# Patient Record
Sex: Male | Born: 1960
Health system: Southern US, Community
[De-identification: ages and names within clinical notes are randomized; demographics above are authoritative.]

## PROBLEM LIST (undated history)

## (undated) DIAGNOSIS — C61 Malignant neoplasm of prostate: Secondary | ICD-10-CM

## (undated) DIAGNOSIS — K5792 Diverticulitis of intestine, part unspecified, without perforation or abscess without bleeding: Secondary | ICD-10-CM

## (undated) DIAGNOSIS — F419 Anxiety disorder, unspecified: Secondary | ICD-10-CM

## (undated) DIAGNOSIS — C801 Malignant (primary) neoplasm, unspecified: Secondary | ICD-10-CM

## (undated) DIAGNOSIS — E785 Hyperlipidemia, unspecified: Secondary | ICD-10-CM

## (undated) DIAGNOSIS — T7840XA Allergy, unspecified, initial encounter: Secondary | ICD-10-CM

## (undated) DIAGNOSIS — I10 Essential (primary) hypertension: Secondary | ICD-10-CM

## (undated) HISTORY — DX: Anxiety disorder, unspecified: F41.9

## (undated) HISTORY — PX: LIPOMA EXCISION: SHX5283

## (undated) HISTORY — PX: TONSILLECTOMY: SUR1361

## (undated) HISTORY — DX: Hyperlipidemia, unspecified: E78.5

## (undated) HISTORY — DX: Allergy, unspecified, initial encounter: T78.40XA

---

## 1993-09-06 HISTORY — PX: VASECTOMY: SHX75

## 1998-09-06 DIAGNOSIS — J301 Allergic rhinitis due to pollen: Secondary | ICD-10-CM | POA: Insufficient documentation

## 2001-09-06 DIAGNOSIS — F410 Panic disorder [episodic paroxysmal anxiety] without agoraphobia: Secondary | ICD-10-CM | POA: Insufficient documentation

## 2007-10-08 ENCOUNTER — Emergency Department: Payer: Self-pay | Admitting: Emergency Medicine

## 2011-01-05 HISTORY — PX: PROSTATE BIOPSY: SHX241

## 2012-02-21 DIAGNOSIS — Z86010 Personal history of colonic polyps: Secondary | ICD-10-CM | POA: Insufficient documentation

## 2012-03-02 LAB — HM COLONOSCOPY

## 2013-02-05 ENCOUNTER — Emergency Department: Payer: Self-pay | Admitting: Emergency Medicine

## 2013-03-30 ENCOUNTER — Inpatient Hospital Stay: Payer: Self-pay | Admitting: Surgery

## 2013-03-30 LAB — COMPREHENSIVE METABOLIC PANEL
Alkaline Phosphatase: 77 U/L (ref 50–136)
BUN: 9 mg/dL (ref 7–18)
Calcium, Total: 9 mg/dL (ref 8.5–10.1)
Chloride: 102 mmol/L (ref 98–107)
Co2: 24 mmol/L (ref 21–32)
Creatinine: 1.1 mg/dL (ref 0.60–1.30)
EGFR (African American): >60
EGFR (Non-African Amer.): >60
Glucose: 129 mg/dL — ABNORMAL HIGH (ref 65–99)
Osmolality: 269 (ref 275–301)
SGOT(AST): 26 U/L (ref 15–37)
SGPT (ALT): 36 U/L (ref 12–78)
Sodium: 134 mmol/L — ABNORMAL LOW (ref 136–145)
Total Protein: 7.1 g/dL (ref 6.4–8.2)

## 2013-03-30 LAB — CBC
HCT: 44.1 % (ref 40.0–52.0)
HGB: 15.4 g/dL (ref 13.0–18.0)
MCH: 33.2 pg (ref 26.0–34.0)
RBC: 4.64 10*6/uL (ref 4.40–5.90)
WBC: 11.5 10*3/uL — ABNORMAL HIGH (ref 3.8–10.6)

## 2013-03-30 LAB — URINALYSIS, COMPLETE
Blood: NEGATIVE
Glucose,UR: NEGATIVE mg/dL (ref 0–75)
Ketone: NEGATIVE
Nitrite: NEGATIVE
Protein: NEGATIVE
Squamous Epithelial: <1
WBC UR: 1 /HPF (ref 0–5)

## 2013-03-30 LAB — LIPASE, BLOOD: Lipase: 118 U/L (ref 73–393)

## 2013-03-31 LAB — CBC WITH DIFFERENTIAL/PLATELET
Eosinophil %: 1 %
HCT: 41.5 % (ref 40.0–52.0)
HGB: 14.6 g/dL (ref 13.0–18.0)
Lymphocyte #: 1.3 10*3/uL (ref 1.0–3.6)
MCHC: 35.1 g/dL (ref 32.0–36.0)
MCV: 95 fL (ref 80–100)
Monocyte #: 0.9 x10 3/mm (ref 0.2–1.0)
Monocyte %: 8 %
Neutrophil #: 8.8 10*3/uL — ABNORMAL HIGH (ref 1.4–6.5)
Neutrophil %: 78.6 %
RBC: 4.36 10*6/uL — ABNORMAL LOW (ref 4.40–5.90)
WBC: 11.2 10*3/uL — ABNORMAL HIGH (ref 3.8–10.6)

## 2013-03-31 LAB — BASIC METABOLIC PANEL
BUN: 10 mg/dL (ref 7–18)
Chloride: 103 mmol/L (ref 98–107)
EGFR (Non-African Amer.): >60
Glucose: 120 mg/dL — ABNORMAL HIGH (ref 65–99)
Osmolality: 270 (ref 275–301)
Potassium: 3.6 mmol/L (ref 3.5–5.1)
Sodium: 135 mmol/L — ABNORMAL LOW (ref 136–145)

## 2013-04-01 LAB — CBC WITH DIFFERENTIAL/PLATELET
Basophil #: 0.1 10*3/uL (ref 0.0–0.1)
Basophil %: 1.3 %
Eosinophil %: 4.7 %
HCT: 40.1 % (ref 40.0–52.0)
HGB: 13.9 g/dL (ref 13.0–18.0)
Lymphocyte %: 21.4 %
MCH: 33.4 pg (ref 26.0–34.0)
MCHC: 34.6 g/dL (ref 32.0–36.0)
Monocyte %: 10.3 %
Neutrophil %: 62.3 %
Platelet: 243 10*3/uL (ref 150–440)
RBC: 4.14 10*6/uL — ABNORMAL LOW (ref 4.40–5.90)
RDW: 12.5 % (ref 11.5–14.5)
WBC: 6.8 10*3/uL (ref 3.8–10.6)

## 2014-04-19 ENCOUNTER — Ambulatory Visit: Payer: Self-pay | Admitting: Surgery

## 2014-06-26 LAB — BASIC METABOLIC PANEL
BUN: 15 mg/dL (ref 4–21)
Creatinine: 0.9 mg/dL (ref 0.6–1.3)
Glucose: 108 mg/dL
Potassium: 5.1 mmol/L (ref 3.4–5.3)
Sodium: 137 mmol/L (ref 137–147)

## 2014-06-26 LAB — LIPID PANEL
CHOLESTEROL: 238 mg/dL — AB (ref 0–200)
HDL: 49 mg/dL (ref 35–70)
LDL CALC: 113 mg/dL
TRIGLYCERIDES: 380 mg/dL — AB (ref 40–160)

## 2014-06-27 DIAGNOSIS — E781 Pure hyperglyceridemia: Secondary | ICD-10-CM | POA: Insufficient documentation

## 2014-12-27 NOTE — H&P (Signed)
PATIENT NAME:  Gerald Boyer, Gerald Boyer MR#:  [id number] DATE OF BIRTH:  [birth year]  DATE OF ADMISSION:  03/30/2013  PRIMARY CARE PHYSICIAN: Dr. Caryn Section at [facility].   ADMITTING PHYSICIAN: Rodena Goldmann III, MD  CHIEF COMPLAINT: Abdominal pain.   BRIEF HISTORY: Euan Wandler is a 54 year old gentleman seen in the Emergency Room with a 24-hour history of abdominal pain. He noted some bloating and some mild left lower quadrant pain last evening. He awoke with some fever overnight and pain increased. He presented to his primary care physician for evaluation this morning. He was referred to the Emergency Room where his white count was elevated. Workup in the Emergency Room revealed a white blood cell count of 11,000 with pronounced left lower quadrant pain. CT scan was performed, which revealed probable perforation and descending diverticulitis. The surgical service was consulted.   He denies any previous similar symptoms. He has not had any other significant abdominal history. He has no history of hepatitis, yellow jaundice, pancreatitis, peptic ulcer disease, gallbladder disease or previous diagnosis of diverticulitis. He has had no previous abdominal surgery. His only previous surgeries were vasectomy, lipoma on his shoulder and tonsils and adenoids as a child. He has no major medical problems. He does have a history of depression. He denies a history of hypertension, diabetes or cardiac disease. He does have a history of elevated PSA, currently under work-up. He has been on Avodart in the past and has had negative biopsy on several occasions.   FAMILY HISTORY: Unremarkable, with the exception of a history of atrial fibrillation.   REVIEW OF SYSTEMS:  A 10-point review of systems is unremarkable.   ALLERGIES:  He has  no medical allergies.   CURRENT MEDICATIONS: Include clonazepam 0.5 mg p.o. daily, Effexor XR 150 mg p.o. daily.  SOCIAL HISTORY: He is not a cigarette smoker. 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   PHYSICAL EXAMINATION: GENERAL: He is an alert, pleasant gentleman, obviously in moderate discomfort from his abdominal pain.  VITAL SIGNS: His temperature is 100. Blood pressure is 140/76. Heart rate is 110 and regular.  HEENT: No scleral icterus. No pupillary abnormalities. No facial deformities.  NECK: Supple, nontender, with a midline trachea and no adenopathy.  CHEST: Clear with no adventitious sounds. He has normal pulmonary excursion.  CARDIAC: No murmurs or gallops. Seems to be in normal sinus rhythm.  ABDOMEN: Soft with some moderate left lower quadrant tenderness. There is no rebound or guarding. There is no suprapubic tenderness. He has active bowel sounds.  EXTREMITIES: Lower extremity exam reveals full range of motion, good distal pulses and no deformities.  PSYCHIATRIC: Reveals some depressive affect, but no orientation problems.   ASSESSMENT AND PLAN: I have independently reviewed his CT scan. With his clinical presentation and his CT findings, he does appear to have a perforated descending colon diverticulitis. I have talked with him about the implications of diverticulitis. We have recommended admission to the hospital, aggressive antibiotic therapy and further followup. The possibility of surgery with the risk of a temporary colostomy has been outlined to him in detail. He is in agreement with the current plan.    ____________________________ Micheline Maze, MD rle:jm D: 03/30/2013 20:10:33 ET T: 03/30/2013 21:34:21 ET JOB#: [id number]  cc: Micheline Maze, MD, <Dictator> Kirstie Peri. Caryn Section, MD Rodena Goldmann MD ELECTRONICALLY SIGNED 04/01/2013 2:32

## 2014-12-27 NOTE — Discharge Summary (Signed)
PATIENT NAME:  Gerald Boyer, Gerald Boyer MR#:  [id number] DATE OF BIRTH:  May 27, 1961  DATE OF ADMISSION:  03/30/2013 DATE OF DISCHARGE:    BRIEF HISTORY: Gerald Boyer is a 53 year old gentleman without major medical problems admitted through the Emergency Room with, what appears to be, a perforated descending colitis. The patient had evidence of left lower quadrant pain for several days. Workup suggested microperforation and inflammatory change consistent with diverticulitis. He was admitted to the hospital, placed on IV antibiotics. His symptoms improved very rapidly. His pain is well-controlled. He is discharged home this morning on p.o. antibiotics, pain meds with the plan of following up in the office. We discussed potential for possible elective surgical intervention. He is going to discus problem with his family physician.   DISCHARGE MEDICATIONS: Includes: Claritin p.r.n., ibuprofen 600 mg every 8 hours p.r.n., Percocet 5/325 every 6 hours p.r.n., Effexor 150 mg XR 1 capsule every day and clonazepam 0.5 mg p.o. at bedtime.   FINAL DISCHARGE DIAGNOSIS: Perforated diverticulitis.  ____________________________ Micheline Maze, MD rle:aw D: 04/02/2013 06:26:49 ET T: 04/02/2013 07:51:13 ET JOB#: [id number]  cc: Micheline Maze, MD, <Dictator> Kirstie Peri. Caryn Section, MD Rodena Goldmann MD ELECTRONICALLY SIGNED 04/10/2013 15:14

## 2015-03-15 ENCOUNTER — Emergency Department
Admission: EM | Admit: 2015-03-15 | Discharge: 2015-03-15 | Disposition: A | Discharge status: Home / Self Care | Payer: Commercial Managed Care - HMO | Attending: Emergency Medicine | Admitting: Emergency Medicine

## 2015-03-15 ENCOUNTER — Other Ambulatory Visit: Payer: Self-pay

## 2015-03-15 ENCOUNTER — Emergency Department: Payer: Commercial Managed Care - HMO

## 2015-03-15 ENCOUNTER — Encounter: Payer: Self-pay | Admitting: General Practice

## 2015-03-15 DIAGNOSIS — R0789 Other chest pain: Secondary | ICD-10-CM

## 2015-03-15 DIAGNOSIS — Z79899 Other long term (current) drug therapy: Secondary | ICD-10-CM | POA: Insufficient documentation

## 2015-03-15 DIAGNOSIS — I10 Essential (primary) hypertension: Secondary | ICD-10-CM | POA: Insufficient documentation

## 2015-03-15 DIAGNOSIS — R0781 Pleurodynia: Secondary | ICD-10-CM | POA: Insufficient documentation

## 2015-03-15 HISTORY — DX: Essential (primary) hypertension: I10

## 2015-03-15 LAB — COMPREHENSIVE METABOLIC PANEL
ALK PHOS: 53 U/L (ref 38–126)
ALT: 37 U/L (ref 17–63)
AST: 38 U/L (ref 15–41)
Albumin: 4.3 g/dL (ref 3.5–5.0)
Anion gap: 11 (ref 5–15)
BILIRUBIN TOTAL: 0.9 mg/dL (ref 0.3–1.2)
BUN: 18 mg/dL (ref 6–20)
CHLORIDE: 99 mmol/L — AB (ref 101–111)
CO2: 25 mmol/L (ref 22–32)
Calcium: 9.2 mg/dL (ref 8.9–10.3)
Creatinine, Ser: 0.99 mg/dL (ref 0.61–1.24)
GFR calc Af Amer: >60 mL/min (ref >60)
GLUCOSE: 105 mg/dL — AB (ref 65–99)
POTASSIUM: 4 mmol/L (ref 3.5–5.1)
SODIUM: 135 mmol/L (ref 135–145)
TOTAL PROTEIN: 7.8 g/dL (ref 6.5–8.1)

## 2015-03-15 LAB — CBC
HEMATOCRIT: 49.8 % (ref 40.0–52.0)
HEMOGLOBIN: 17.1 g/dL (ref 13.0–18.0)
MCH: 32.8 pg (ref 26.0–34.0)
MCHC: 34.4 g/dL (ref 32.0–36.0)
MCV: 95.5 fL (ref 80.0–100.0)
Platelets: 294 10*3/uL (ref 150–440)
RBC: 5.22 MIL/uL (ref 4.40–5.90)
RDW: 12.7 % (ref 11.5–14.5)
WBC: 7.5 10*3/uL (ref 3.8–10.6)

## 2015-03-15 LAB — TROPONIN I: Troponin I: <0.03 ng/mL (ref <0.031)

## 2015-03-15 LAB — FIBRIN DERIVATIVES D-DIMER (ARMC ONLY): FIBRIN DERIVATIVES D-DIMER (ARMC): 299 (ref 0–499)

## 2015-03-15 MED ORDER — NITROGLYCERIN 0.4 MG SL SUBL
0.4000 mg | SUBLINGUAL_TABLET | SUBLINGUAL | Status: DC | PRN
  Administered 2015-03-15: 0.4 mg via SUBLINGUAL

## 2015-03-15 MED ORDER — ASPIRIN 81 MG PO CHEW
325.0000 mg | CHEWABLE_TABLET | ORAL | Status: AC
  Administered 2015-03-15: 325 mg via ORAL

## 2015-03-15 MED ORDER — NITROGLYCERIN 0.4 MG SL SUBL
SUBLINGUAL_TABLET | SUBLINGUAL | Status: AC
  Administered 2015-03-15: 0.4 mg via SUBLINGUAL
  Filled 2015-03-15: qty 1

## 2015-03-15 MED ORDER — ASPIRIN EC 81 MG PO TBEC
DELAYED_RELEASE_TABLET | ORAL | Status: AC
  Filled 2015-03-15: qty 4

## 2015-03-15 NOTE — ED Notes (Signed)
Pt. Arrived to ed from home with reports of experiencing right side chest pain since 5AM . Pt verbalized he was walking to the bathroom this morning when pain started. Pt reports "slight shortness of breath" with pain. Pt describes pain as a sharp, aching pain to right chest. Alert and oriented. Denies N/V. Denies cardiac hx.

## 2015-03-15 NOTE — Discharge Instructions (Signed)
Chest Pain Observation  Follow-up with cardiology on Monday. Return to the emergency room right away should you develop chest pressure, sweats, nausea, feel lightheaded, have severe recurring chest pain, or other new concerns arise.  It is often hard to give a specific diagnosis for the cause of chest pain. Among other possibilities your symptoms might be caused by inadequate oxygen delivery to your heart (angina). Angina that is not treated or evaluated can lead to a heart attack (myocardial infarction) or death. Blood tests, electrocardiograms, and X-rays may have been done to help determine a possible cause of your chest pain. After evaluation and observation, your health care provider has determined that it is unlikely your pain was caused by an unstable condition that requires hospitalization. However, a full evaluation of your pain may need to be completed, with additional diagnostic testing as directed. It is very important to keep your follow-up appointments. Not keeping your follow-up appointments could result in permanent heart damage, disability, or death. If there is any problem keeping your follow-up appointments, you must call your health care provider. HOME CARE INSTRUCTIONS  Due to the slight chance that your pain could be angina, it is important to follow your health care provider's treatment plan and also maintain a healthy lifestyle:  Maintain or work toward achieving a healthy weight.  Stay physically active and exercise regularly.  Decrease your salt intake.  Eat a balanced, healthy diet. Talk to a dietitian to learn about heart-healthy foods.  Increase your fiber intake by including whole grains, vegetables, fruits, and nuts in your diet.  Avoid situations that cause stress, anger, or depression.  Take medicines as advised by your health care provider. Report any side effects to your health care provider. Do not stop medicines or adjust the dosages on your own.  Quit  smoking. Do not use nicotine patches or gum until you check with your health care provider.  Keep your blood pressure, blood sugar, and cholesterol levels within normal limits.  Limit alcohol intake to no more than 1 drink per day for women who are not pregnant and 2 drinks per day for men.  Do not abuse drugs. SEEK IMMEDIATE MEDICAL CARE IF: You have severe chest pain or pressure which may include symptoms such as:  You feel pain or pressure in your arms, neck, jaw, or back.  You have severe back or abdominal pain, feel sick to your stomach (nauseous), or throw up (vomit).  You are sweating profusely.  You are having a fast or irregular heartbeat.  You feel short of breath while at rest.  You notice increasing shortness of breath during rest, sleep, or with activity.  You have chest pain that does not get better after rest or after taking your usual medicine.  You wake from sleep with chest pain.  You are unable to sleep because you cannot breathe.  You develop a frequent cough or you are coughing up blood.  You feel dizzy, faint, or experience extreme fatigue.  You develop severe weakness, dizziness, fainting, or chills. Any of these symptoms may represent a serious problem that is an emergency. Do not wait to see if the symptoms will go away. Call your local emergency services (911 in the [place] Do not drive yourself to the hospital. MAKE SURE YOU:  Understand these instructions.  Will watch your condition.  Will get help right away if you are not doing well or get worse. Document Released: 09/25/2010 Document Revised: 08/28/2013 Document Reviewed: 02/22/2013 Exit[employer] Patient Information  2015 [employer]. This information is not intended to replace advice given to you by your health care provider. Make sure you discuss any questions you have with your health care provider.

## 2015-03-15 NOTE — ED Provider Notes (Signed)
[facility] Emergency Department Provider Note  ____________________________________________  Time seen: Approximately 9:59 AM  I have reviewed the triage vital signs and the nursing notes.   HISTORY  Chief Complaint Chest Pain    HPI Gerald Boyer is a 54 y.o. male who has a history of hypertension. He presents today states he noticed he is having aching pain in his right chest when he got up to use the bathroom at about 5 AM. It slightly sharp in intensity and worsened with deep breathing. Dislocated at the right border of his rib cage and breastbone. Does not radiate to the back or arm. Denies any abdominal pain nausea, vomiting, or sweating. He did feel a little short of breath with this.  Denies any chest pressure.  States he has no history of cardiac disease. He has had a stress test several years ago but was told this was normal via his primary care doctor.  He does take amlodipine and Effexor.   Past Medical History  Diagnosis Date  . Hypertension     There are no active problems to display for this patient.   History reviewed. No pertinent past surgical history.  Current Outpatient Rx  Name  Route  Sig  Dispense  Refill  . amLODipine (NORVASC) 2.5 MG tablet   Oral   Take 2.5 mg by mouth daily.      6   . venlafaxine XR (EFFEXOR-XR) 150 MG 24 hr capsule   Oral   Take 150 mg by mouth daily.      11     Allergies Review of patient's allergies indicates no known allergies.  No family history on file.  Social History History  Substance Use Topics  . Smoking status: Never Smoker   . Smokeless tobacco: Never Used  . Alcohol Use: Yes    Review of Systems Constitutional: No fever/chills Eyes: No visual changes. ENT: No sore throat. Cardiovascular: See history of present illness Respiratory: See history of present illness Gastrointestinal: No abdominal pain.  No nausea, no vomiting.  No diarrhea.  No  constipation. Genitourinary: Negative for dysuria. Musculoskeletal: Negative for back pain. Skin: Negative for rash. Neurological: Negative for headaches, focal weakness or numbness.  No history of previous blood clots. No recent travel, surgery, or leg swelling. No cough. He is nonsmoker.  10-point ROS otherwise negative.  ____________________________________________   PHYSICAL EXAM:  VITAL SIGNS: ED Triage Vitals  Enc Vitals Group     BP 03/15/15 0931 165/105 mmHg     Pulse Rate 03/15/15 0931 84     Resp 03/15/15 0931 20     Temp 03/15/15 0931 98.1 F (36.7 C)     Temp Source 03/15/15 0931 Oral     SpO2 03/15/15 0931 100 %     Weight 03/15/15 0929 205 lb (92.987 kg)     Height 03/15/15 0929 5\' 8"  (1.727 m)     Head Cir --      Peak Flow --      Pain Score 03/15/15 0929 6     Pain Loc --      Pain Edu? --      Excl. in [place]? --     Constitutional: Alert and oriented. Well appearing and in no acute distress. Eyes: Conjunctivae are normal. PERRL. EOMI. Head: Atraumatic. Nose: No congestion/rhinnorhea. Mouth/Throat: Mucous membranes are moist.  Oropharynx non-erythematous. Neck: No stridor.   Cardiovascular: Normal rate, regular rhythm. Grossly normal heart sounds.  Good peripheral circulation. Patient does  have point tenderness along the right anterior sternal border which reproduces his pain, stating this area feel sore and hurts more when he takes a deep breath. There is no lesion noted. Respiratory: Normal respiratory effort.  No retractions. Lungs CTAB. Patient does report a sharp pain along the right sternum when taking a deep breath. Gastrointestinal: Soft and nontender. No distention. No abdominal bruits. No CVA tenderness. Musculoskeletal: No lower extremity tenderness nor edema.  No joint effusions. Neurologic:  Normal speech and language. No gross focal neurologic deficits are appreciated. Speech is normal. No gait instability. Skin:  Skin is warm, dry and intact.  No rash noted. Psychiatric: Mood and affect are normal. Speech and behavior are normal.  ____________________________________________   LABS (all labs ordered are listed, but only abnormal results are displayed)  Labs Reviewed  COMPREHENSIVE METABOLIC PANEL - Abnormal; Notable for the following:    Chloride 99 (*)    Glucose, Bld 105 (*)    All other components within normal limits  TROPONIN I  CBC  FIBRIN DERIVATIVES D-DIMER ([facility] ONLY)  TROPONIN I   ____________________________________________  EKG  ED ECG REPORT I, Paolina Karwowski, the attending physician, personally viewed and interpreted this ECG.  Date: 03/15/2015 EKG Time: 9:30 AM Rate: 80 Rhythm: normal sinus rhythm QRS Axis: Left Intervals: normal ST/T Wave abnormalities: normal Conduction Disutrbances: none Narrative Interpretation: unremarkable  ____________________________________________  RADIOLOGY  DG Chest 2 View (Final result) Result time: 03/15/15 10:26:37   Final result by Rad Results In Interface (03/15/15 10:26:37)   Narrative:   CLINICAL DATA: Patient with right-sided chest.  EXAM: CHEST 2 VIEW  COMPARISON: Chest radiograph 03/30/2013  FINDINGS: Stable cardiac and mediastinal contours. No consolidative pulmonary opacities. No pleural effusion or pneumothorax. Regional skeleton is unremarkable.  IMPRESSION: No acute cardiopulmonary process.    ____________________________________________   PROCEDURES  Procedure(s) performed: None  Critical Care performed: No  ____________________________________________   INITIAL IMPRESSION / ASSESSMENT AND PLAN / ED COURSE  Pertinent labs & imaging results that were available during my care of the patient were reviewed by me and considered in my medical decision making (see chart for details).  Patient presents with right-sided pleuritic chest pain. EKG is normal. Differential diagnosis would certainly include acute coronary syndrome  though his EKG is completely normal and he has very atypical symptoms, pneumothorax, pulmonary embolism for which she is very low risk and we'll screen with d-dimer, pleurisy, or musculoskeletal chest pain. Based on my assessment I feel the patient is most likely suffering from musculoskeletal or pleuritic type pain and this is unlikely to represent acute coronary syndrome but we will obtain troponin at 0 and 3 hours as we rule him out. His HEART score is indicative of low risk.  ----------------------------------------- 11:15 AM on 03/15/2015 -----------------------------------------  Patient's chest x-ray is clear. Initial troponin is normal and d-dimer is less than 500. At this point we will continue to observe and plan a second troponin at 140p.  ----------------------------------------- 2:02 PM on 03/15/2015 -----------------------------------------  Patient remains very stable. He reports he is pain-free at this time. Second troponin is pending. Discussed with Dr. Raford Pitcher of cardiology service. We'll plan to discharge the patient as he is low risk for chest pain is very atypical, he is stable and much improved. I discussed the very close return precautions and he'll call cardiology to set up follow-up on Monday or Tuesday with them for further reevaluation.  ----------------------------------------- 2:25 PM on 03/15/2015 -----------------------------------------  Return precautions discussed with the patient. We'll  discharge him home if second troponin is negative which is currently in the lab. ____________________________________________   FINAL CLINICAL IMPRESSION(S) / ED DIAGNOSES  Final diagnoses:  Chest pain, musculoskeletal  Non-specific, pleuritic.     Delman Kitten, MD 03/15/15 [phone]

## 2015-03-17 ENCOUNTER — Telehealth: Payer: Self-pay | Admitting: *Deleted

## 2015-03-17 NOTE — Telephone Encounter (Signed)
lmov for patient to schedule hospital follow up with R.Dunn

## 2015-03-25 ENCOUNTER — Other Ambulatory Visit: Payer: Self-pay | Admitting: Family Medicine

## 2015-03-25 DIAGNOSIS — F410 Panic disorder [episodic paroxysmal anxiety] without agoraphobia: Secondary | ICD-10-CM

## 2015-03-27 NOTE — Telephone Encounter (Signed)
Prescription called in to pharmacy

## 2015-03-27 NOTE — Telephone Encounter (Signed)
Please call in the following medication.  [id number] [phone]   Surescripts Out Interface  2 days ago   (11:22 AM)       Requested Medications     Medication name:  Name from pharmacy:  clonazePAM (KLONOPIN) 0.5 MG tablet CLONAZEPAM 0.5 MG TABLET    Sig: TAKE 1 TABLET BY MOUTH UP TO 3 TIMES A DAY AS NEEDED #60, rf x 3

## 2015-04-01 NOTE — Telephone Encounter (Signed)
lmov for patient to call and schedule stress test with patient and fu from hospital

## 2015-05-07 NOTE — Telephone Encounter (Signed)
lmov for patient to call and schedule stress test with patient and fu from hospital  °

## 2015-05-23 ENCOUNTER — Encounter: Payer: Self-pay | Admitting: *Deleted

## 2015-05-26 ENCOUNTER — Other Ambulatory Visit: Payer: Self-pay | Admitting: Family Medicine

## 2015-06-24 ENCOUNTER — Ambulatory Visit: Payer: Commercial Managed Care - HMO | Admitting: Physician Assistant

## 2015-06-26 ENCOUNTER — Other Ambulatory Visit: Payer: Self-pay | Admitting: Family Medicine

## 2015-07-25 ENCOUNTER — Other Ambulatory Visit: Payer: Self-pay | Admitting: Family Medicine

## 2015-07-25 NOTE — Telephone Encounter (Signed)
Patient has not been seen in over a year, he needs to scheduled office visit before refills can be approved.

## 2015-07-25 NOTE — Telephone Encounter (Signed)
Left message to call back  

## 2015-08-04 DIAGNOSIS — H7290 Unspecified perforation of tympanic membrane, unspecified ear: Secondary | ICD-10-CM | POA: Insufficient documentation

## 2015-08-04 DIAGNOSIS — I10 Essential (primary) hypertension: Secondary | ICD-10-CM | POA: Insufficient documentation

## 2015-08-04 DIAGNOSIS — R972 Elevated prostate specific antigen [PSA]: Secondary | ICD-10-CM | POA: Insufficient documentation

## 2015-08-04 DIAGNOSIS — G259 Extrapyramidal and movement disorder, unspecified: Secondary | ICD-10-CM

## 2015-08-05 ENCOUNTER — Encounter: Payer: Self-pay | Admitting: Family Medicine

## 2015-08-05 ENCOUNTER — Ambulatory Visit (INDEPENDENT_AMBULATORY_CARE_PROVIDER_SITE_OTHER): Payer: Commercial Managed Care - HMO | Admitting: Family Medicine

## 2015-08-05 VITALS — BP 138/90 | HR 80 | Temp 98.2°F | Resp 16 | Wt 212.0 lb

## 2015-08-05 DIAGNOSIS — Z23 Encounter for immunization: Secondary | ICD-10-CM

## 2015-08-05 DIAGNOSIS — F410 Panic disorder [episodic paroxysmal anxiety] without agoraphobia: Secondary | ICD-10-CM | POA: Diagnosis not present

## 2015-08-05 DIAGNOSIS — I10 Essential (primary) hypertension: Secondary | ICD-10-CM

## 2015-08-05 MED ORDER — CLONAZEPAM 0.5 MG PO TABS: ORAL_TABLET | ORAL | Status: DC

## 2015-08-05 MED ORDER — VENLAFAXINE HCL ER 150 MG PO CP24: 150.0000 mg | ORAL_CAPSULE | Freq: Every day | ORAL | Status: DC

## 2015-08-05 MED ORDER — AMLODIPINE BESYLATE 2.5 MG PO TABS: 2.5000 mg | ORAL_TABLET | Freq: Every day | ORAL | Status: DC

## 2015-08-05 NOTE — Progress Notes (Signed)
Patient: Gerald Boyer Male    DOB: 1961-02-22   54 y.o.   MRN: [id number] Visit Date: 08/05/2015  Today's Provider: Lelon Huh, MD   Chief Complaint  Patient presents with  . Follow-up  . Medication Refill   Subjective:    HPI  Follow-up for panic disorder from 06/26/2014; no changes were made. Is doing well with Effexor and clonazepam regiment. . Is effective with no adverse effects.    Hypertension, follow-up:  BP Readings from Last 3 Encounters:  08/05/15 140/99  06/26/14 146/88  03/15/15 137/86    He was last seen for hypertension 11 months ago.  BP at that visit was 148/88. Management since that visit includes; started amlodipine 2.5 mg and advised to lose weight. He reports good compliance with treatment. He is not having side effects. none  He is exercising. He is adherent to low salt diet.   Outside blood pressures are 150/101. He is experiencing chest pain.  Patient denies none.   Cardiovascular risk factors include none.  Use of agents associated with hypertension: none.     Weight trend: stable Wt Readings from Last 3 Encounters:  08/05/15 212 lb (96.163 kg)  06/26/14 211 lb (95.709 kg)  03/15/15 205 lb (92.987 kg)    Current diet: well balanced  ----------------------------------------------------------------------    No Known Allergies Previous Medications   AMLODIPINE (NORVASC) 2.5 MG TABLET    TAKE 1 TABLET BY MOUTH EVERY DAY   CLONAZEPAM (KLONOPIN) 0.5 MG TABLET    TAKE 1 TABLET BY MOUTH UP TO 3 TIMES A DAY AS NEEDED   DUTASTERIDE (AVODART) 0.5 MG CAPSULE    Take 1 capsule by mouth daily.   LORATADINE 10 MG CAPS    Take 1 tablet by mouth daily.   VENLAFAXINE XR (EFFEXOR-XR) 150 MG 24 HR CAPSULE    TAKE ONE CAPSULE BY MOUTH DAILY    Review of Systems  Constitutional: Negative for fever, chills and appetite change.  Respiratory: Negative for chest tightness, shortness of breath and wheezing.   Cardiovascular: Negative  for chest pain and palpitations.  Gastrointestinal: Negative for nausea, vomiting and abdominal pain.  Neurological: Negative for dizziness, light-headedness and headaches.    Social History  Substance Use Topics  . Smoking status: Never Smoker   . Smokeless tobacco: Never Used  . Alcohol Use: 0.0 oz/week    0 Standard drinks or equivalent per week     Comment: occasional use   Objective:     Filed Vitals:   08/05/15 0912 08/05/15 0927  BP: 140/99 138/90  Pulse: 80   Temp: 98.2 F (36.8 C)   TempSrc: Oral   Resp: 16   Weight: 212 lb (96.163 kg)      Physical Exam  General Appearance:    Alert, cooperative, no distress, obese  Eyes:    PERRL, conjunctiva/corneas clear, EOM's intact       Lungs:     Clear to auscultation bilaterally, respirations unlabored  Heart:    Regular rate and rhythm  Neurologic:   Awake, alert, oriented x 3. No apparent focal neurological           defect.           Assessment & Plan:     1. Panic disorder Doing well current medications. Continue current plan of care - venlafaxine XR (EFFEXOR-XR) 150 MG 24 hr capsule; Take 1 capsule (150 mg total) by mouth daily.  Dispense: 30 capsule; Refill: 5 -  clonazePAM (KLONOPIN) 0.5 MG tablet; TAKE 1 TABLET BY MOUTH UP TO 3 TIMES A DAY AS NEEDED  Dispense: 60 tablet; Refill: 3  2. Essential (primary) hypertension Fairly well controlled. Counseled to reduce sodium in diet and exercise to lose weight.  Continue current medications.    - amLODipine (NORVASC) 2.5 MG tablet; Take 1 tablet (2.5 mg total) by mouth daily.  Dispense: 30 tablet; Refill: 5  3. Need for prophylactic vaccination and inoculation against influenza  - Flu Vaccine QUAD 36+ mos IM   Advised to schedule annual CPE in the spring.       Lelon Huh, MD  [facility]

## 2015-08-05 NOTE — Telephone Encounter (Signed)
Rx called in to pharmacy. 

## 2015-08-05 NOTE — Telephone Encounter (Signed)
Please phone in clonazepam prescription to [place]. Thanks.

## 2015-12-02 ENCOUNTER — Ambulatory Visit (INDEPENDENT_AMBULATORY_CARE_PROVIDER_SITE_OTHER): Payer: Commercial Managed Care - HMO | Admitting: Family Medicine

## 2015-12-02 ENCOUNTER — Encounter: Payer: Self-pay | Admitting: Family Medicine

## 2015-12-02 VITALS — BP 146/92 | HR 97 | Temp 98.4°F | Resp 16 | Ht 68.0 in | Wt 208.0 lb

## 2015-12-02 DIAGNOSIS — I10 Essential (primary) hypertension: Secondary | ICD-10-CM | POA: Diagnosis not present

## 2015-12-02 DIAGNOSIS — Z1159 Encounter for screening for other viral diseases: Secondary | ICD-10-CM | POA: Diagnosis not present

## 2015-12-02 DIAGNOSIS — Z0000 Encounter for general adult medical examination without abnormal findings: Secondary | ICD-10-CM

## 2015-12-02 DIAGNOSIS — F410 Panic disorder [episodic paroxysmal anxiety] without agoraphobia: Secondary | ICD-10-CM

## 2015-12-02 MED ORDER — AMLODIPINE BESYLATE 2.5 MG PO TABS: 5.0000 mg | ORAL_TABLET | Freq: Every day | ORAL | Status: DC

## 2015-12-02 NOTE — Progress Notes (Signed)
Patient: Gerald Boyer, Male    DOB: 1960/10/04, 55 y.o.   MRN: [id number] Visit Date: 12/02/2015  Today's Provider: Lelon Huh, MD   Chief Complaint  Patient presents with  . Annual Exam  . Hypertension   Subjective:    Annual physical exam Gerald Boyer is a 55 y.o. male who presents today for health maintenance and complete physical. He feels well. He reports exercising yes/walking. He reports he is sleeping poorly.  -----------------------------------------------------------------  Follow-up for panic disorder from 08/05/2015; no changes, continue current medications. Takes 1/2 clonazepam every morning with Effexor and combination is working very well.      Hypertension, follow-up:  BP Readings from Last 3 Encounters:  12/02/15 150/98  08/05/15 138/90  06/26/14 146/88    He was last seen for hypertension 4 months ago.  BP at that visit was 140/99. Management since that visit includes; counseled to reduce sodium in diet and exercise to lose weight.He reports good compliance with treatment. He is not having side effects. none  He is exercising. He is adherent to low salt diet.   Outside blood pressures are 140/90. He is experiencing none.  Patient denies none.   Cardiovascular risk factors include none.  Use of agents associated with hypertension: none.   ----------------------------------------------------------------------     Review of Systems  Constitutional: Negative.  Negative for fever, chills, appetite change and fatigue.  HENT: Positive for congestion. Negative for ear pain, hearing loss, nosebleeds and trouble swallowing.   Eyes: Negative.  Negative for pain and visual disturbance.  Respiratory: Negative.  Negative for cough, chest tightness and shortness of breath.   Cardiovascular: Negative.  Negative for chest pain, palpitations and leg swelling.  Gastrointestinal: Negative.  Negative for nausea, vomiting, abdominal pain,  diarrhea, constipation and blood in stool.  Endocrine: Negative.  Negative for polydipsia, polyphagia and polyuria.  Genitourinary: Negative.  Negative for dysuria and flank pain.  Musculoskeletal: Negative.  Negative for myalgias, back pain, joint swelling, arthralgias and neck stiffness.  Skin: Negative.  Negative for color change, rash and wound.  Allergic/Immunologic: Positive for environmental allergies.  Neurological: Negative.  Negative for dizziness, tremors, seizures, speech difficulty, weakness, light-headedness and headaches.  Hematological: Negative.   Psychiatric/Behavioral: Negative.  Negative for behavioral problems, confusion, sleep disturbance, dysphoric mood and decreased concentration. The patient is not nervous/anxious.   All other systems reviewed and are negative.   Social History      He  reports that he has never smoked. He has never used smokeless tobacco. He reports that he drinks alcohol. He reports that he does not use illicit drugs.       Social History   Social History  . Marital Status: Married    Spouse Name: N/A  . Number of Children: 3  . Years of Education: N/A   Occupational History  . Inside sales    Social History Main Topics  . Smoking status: Never Smoker   . Smokeless tobacco: Never Used  . Alcohol Use: 0.0 oz/week    0 Standard drinks or equivalent per week     Comment: occasional use  . Drug Use: No  . Sexual Activity: Not Asked   Other Topics Concern  . None   Social History Narrative    Past Medical History  Diagnosis Date  . Hypertension   . Hyperlipidemia   . Anxiety   . Allergy      Patient Active Problem List   Diagnosis Date Noted  .  Elevated prostate specific antigen (PSA) 08/04/2015  . Essential (primary) hypertension 08/04/2015  . Ruptured tympanic membrane 08/04/2015  . 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 Hypertriglyceridemia 06/27/2014  . History of colonic polyps 02/21/2012  . Panic disorder 09/06/2001    . Allergic rhinitis due to pollen 09/06/1998    Past Surgical History  Procedure Laterality Date  . Prostate biopsy  01/2011    outpatient, Dr. Eliberto Ivory; 05/2011- mild chronic inflammation, no dysplasia  . Vasectomy  1995  . Lipoma excision      located on  left shoulder    Family History        Family Status  Relation Status Death Age  . Mother Alive   . Father Alive     prediabetes        His family history includes COPD in his mother; Hypertension in his other; Kidney cancer in his mother; Rectal cancer in his daughter.    No Known Allergies  Previous Medications   AMLODIPINE (NORVASC) 2.5 MG TABLET    Take 1 tablet (2.5 mg total) by mouth daily.   CLONAZEPAM (KLONOPIN) 0.5 MG TABLET    TAKE 1 TABLET BY MOUTH UP TO 3 TIMES A DAY AS NEEDED   DUTASTERIDE (AVODART) 0.5 MG CAPSULE    Take 1 capsule by mouth daily. Reported on 12/02/2015   LORATADINE 10 MG CAPS    Take 1 tablet by mouth daily.   VENLAFAXINE XR (EFFEXOR-XR) 150 MG 24 HR CAPSULE    Take 1 capsule (150 mg total) by mouth daily.    Patient Care Team: Birdie Sons, MD as PCP - General (Family Medicine)     Objective:   Vitals: BP 150/98 mmHg  Pulse 97  Temp(Src) 98.4 F (36.9 C) (Oral)  Resp 16  Ht 5\' 8"  (1.727 m)  Wt 208 lb (94.348 kg)  BMI 31.63 kg/m2  SpO2 97%   Physical Exam   General Appearance:    Alert, cooperative, no distress, appears stated age, overweight  Head:    Normocephalic, without obvious abnormality, atraumatic  Eyes:    PERRL, conjunctiva/corneas clear, EOM's intact, fundi    benign, both eyes       Ears:    Normal TM's and external ear canals, both ears  Nose:   Nares normal, septum midline, mucosa normal, no drainage   or sinus tenderness  Throat:   Lips, mucosa, and tongue normal; teeth and gums normal  Neck:   Supple, symmetrical, trachea midline, no adenopathy;       thyroid:  No enlargement/tenderness/nodules; no carotid   bruit or JVD  Back:     Symmetric, no  curvature, ROM normal, no CVA tenderness  Lungs:     Clear to auscultation bilaterally, respirations unlabored  Chest wall:    No tenderness or deformity  Heart:    Regular rate and rhythm, S1 and S2 normal, no murmur, rub   or gallop  Abdomen:     Soft, non-tender, bowel sounds active all four quadrants,    no masses, no organomegaly  Genitalia:    deferred  Rectal:    deferred  Extremities:   Extremities normal, atraumatic, no cyanosis or edema  Pulses:   2+ and symmetric all extremities  Skin:   Skin color, texture, turgor normal, no rashes or lesions  Lymph nodes:   Cervical, supraclavicular, and axillary nodes normal  Neurologic:   CNII-XII intact. Normal strength, sensation and reflexes      throughout    Depression  Screen PHQ 2/9 Scores 12/02/2015  PHQ - 2 Score 0  PHQ- 9 Score 0      Assessment & Plan:     Routine Health Maintenance and Physical Exam  Exercise Activities and Dietary recommendations Goals    None      Immunization History  Administered Date(s) Administered  . Influenza,inj,Quad PF,36+ Mos 08/05/2015  . Tdap 01/15/2011    Health Maintenance  Topic Date Due  . Hepatitis C Screening  [birth year]  . HIV Screening  10/18/1974  . INFLUENZA VACCINE  04/06/2016  . TETANUS/TDAP  01/14/2021  . COLONOSCOPY  03/02/2022      Discussed health benefits of physical activity, and encouraged him to engage in regular exercise appropriate for his age and condition.    --------------------------------------------------------------------  1. Annual physical exam  - EKG 12-Lead - Comprehensive metabolic panel - PSA  2. Essential (primary) hypertension Counseled to exeercise 150 minutes per week to work on losing weight.  - EKG 12-Lead - Lipid panel - TSH - amLODipine (NORVASC) 2.5 MG tablet; Take 2 tablets (5 mg total) by mouth daily.  Dispense: 30 tablet; Refill: 5  3. Panic disorder Doing well on current regiment of clonazepam and Effexor.   4.  Need for hepatitis C screening test  - Hepatitis C antibody

## 2015-12-03 ENCOUNTER — Telehealth: Payer: Self-pay

## 2015-12-03 LAB — LIPID PANEL
CHOL/HDL RATIO: 4.2 ratio (ref 0.0–5.0)
Cholesterol, Total: 220 mg/dL — ABNORMAL HIGH (ref 100–199)
HDL: 53 mg/dL (ref >39)
LDL Calculated: 136 mg/dL — ABNORMAL HIGH (ref 0–99)
Triglycerides: 155 mg/dL — ABNORMAL HIGH (ref 0–149)
VLDL Cholesterol Cal: 31 mg/dL (ref 5–40)

## 2015-12-03 LAB — COMPREHENSIVE METABOLIC PANEL
ALT: 42 IU/L (ref 0–44)
AST: 39 IU/L (ref 0–40)
Albumin/Globulin Ratio: 1.9 (ref 1.2–2.2)
Albumin: 4.3 g/dL (ref 3.5–5.5)
Alkaline Phosphatase: 59 IU/L (ref 39–117)
BILIRUBIN TOTAL: 0.3 mg/dL (ref 0.0–1.2)
BUN/Creatinine Ratio: 16 (ref 9–20)
BUN: 16 mg/dL (ref 6–24)
CALCIUM: 9.2 mg/dL (ref 8.7–10.2)
CO2: 22 mmol/L (ref 18–29)
Chloride: 98 mmol/L (ref 96–106)
Creatinine, Ser: 1.03 mg/dL (ref 0.76–1.27)
GFR calc non Af Amer: 81 mL/min/{1.73_m2} (ref >59)
GFR, EST AFRICAN AMERICAN: 93 mL/min/{1.73_m2} (ref >59)
GLUCOSE: 103 mg/dL — AB (ref 65–99)
Globulin, Total: 2.3 g/dL (ref 1.5–4.5)
Potassium: 4.8 mmol/L (ref 3.5–5.2)
Sodium: 137 mmol/L (ref 134–144)
TOTAL PROTEIN: 6.6 g/dL (ref 6.0–8.5)

## 2015-12-03 LAB — PSA: Prostate Specific Ag, Serum: 71.3 ng/mL — ABNORMAL HIGH (ref 0.0–4.0)

## 2015-12-03 LAB — HEPATITIS C ANTIBODY: Hep C Virus Ab: <0.1 s/co ratio (ref 0.0–0.9)

## 2015-12-03 LAB — TSH: TSH: 1.74 u[IU]/mL (ref 0.450–4.500)

## 2015-12-03 NOTE — Telephone Encounter (Signed)
-----   Message from Birdie Sons, MD sent at 12/03/2015  7:20 AM EDT ----- Cholesterol is 220, which is borderline for requiring medication. Need to work on cutting back on saturated fats such as red meats and dairy. Check labs yearly.

## 2015-12-03 NOTE — Telephone Encounter (Signed)
LMTCB

## 2015-12-05 NOTE — Telephone Encounter (Signed)
Left detailed message on pt's vm. Okay per dpr.  

## 2016-01-31 ENCOUNTER — Other Ambulatory Visit: Payer: Self-pay | Admitting: Family Medicine

## 2016-02-12 ENCOUNTER — Encounter: Payer: Self-pay | Admitting: Family Medicine

## 2016-02-12 ENCOUNTER — Ambulatory Visit (INDEPENDENT_AMBULATORY_CARE_PROVIDER_SITE_OTHER): Payer: Commercial Managed Care - HMO | Admitting: Family Medicine

## 2016-02-12 VITALS — BP 120/84 | HR 84 | Temp 98.5°F | Resp 16 | Ht 68.0 in | Wt 204.0 lb

## 2016-02-12 DIAGNOSIS — J40 Bronchitis, not specified as acute or chronic: Secondary | ICD-10-CM

## 2016-02-12 DIAGNOSIS — F410 Panic disorder [episodic paroxysmal anxiety] without agoraphobia: Secondary | ICD-10-CM

## 2016-02-12 MED ORDER — CLONAZEPAM 0.5 MG PO TABS: ORAL_TABLET | ORAL | Status: DC

## 2016-02-12 MED ORDER — AZITHROMYCIN 250 MG PO TABS: ORAL_TABLET | ORAL | Status: AC

## 2016-02-12 MED ORDER — ALBUTEROL SULFATE HFA 108 (90 BASE) MCG/ACT IN AERS: 2.0000 | INHALATION_SPRAY | Freq: Four times a day (QID) | RESPIRATORY_TRACT | Status: DC | PRN

## 2016-02-12 NOTE — Patient Instructions (Signed)
Return for lung function tests if cough has not completely resolved within 2 weeks, or if cough returns after finishing inhaler and antibiotics.

## 2016-02-12 NOTE — Progress Notes (Signed)
Patient: Gerald Boyer Male    DOB: 1961/04/10   55 y.o.   MRN: [id number] Visit Date: 02/12/2016  Today's Provider: Lelon Huh, MD   Chief Complaint  Patient presents with  . Cough   Subjective:    Cough This is a new problem. The current episode started more than 1 month ago (over 2 months). The problem has been unchanged. The problem occurs constantly. The cough is productive of sputum. Associated symptoms include shortness of breath and wheezing. Pertinent negatives include no chest pain, chills, ear congestion, ear pain, fever, headaches, heartburn, hemoptysis, myalgias, nasal congestion, postnasal drip, rash, rhinorrhea, sore throat, sweats or weight loss. Nothing aggravates the symptoms. Treatments tried: muncinex. The treatment provided no relief. His past medical history is significant for bronchitis and environmental allergies. There is no history of asthma, bronchiectasis, COPD, emphysema or pneumonia.    Persistent cough for over 2 months. Coughs is constants and productive of tanish green sputum. Has symptoms of wheezing and sob sometimes waking him at night. Did take muncinex with no relief.  Had bronchitis several times as child.    No Known Allergies Current Meds  Medication Sig  . amLODipine (NORVASC) 2.5 MG tablet TAKE 1 TABLET (2.5 MG TOTAL) BY MOUTH DAILY.  . clonazePAM (KLONOPIN) 0.5 MG tablet TAKE 1 TABLET BY MOUTH UP TO 3 TIMES A DAY AS NEEDED  . dutasteride (AVODART) 0.5 MG capsule Take 1 capsule by mouth daily. Reported on 12/02/2015  . Loratadine 10 MG CAPS Take 1 tablet by mouth daily.  Marland Kitchen venlafaxine XR (EFFEXOR-XR) 150 MG 24 hr capsule TAKE 1 CAPSULE (150 MG TOTAL) BY MOUTH DAILY.    Review of Systems  Constitutional: Negative for fever, chills, weight loss and appetite change.  HENT: Positive for congestion. Negative for ear pain, postnasal drip, rhinorrhea and sore throat.   Respiratory: Positive for cough, shortness of breath and wheezing.  Negative for hemoptysis and chest tightness.   Cardiovascular: Negative for chest pain and palpitations.  Gastrointestinal: Negative for heartburn, nausea, vomiting and abdominal pain.  Musculoskeletal: Negative for myalgias.  Skin: Negative for rash.  Allergic/Immunologic: Positive for environmental allergies.  Neurological: Negative for headaches.    Social History  Substance Use Topics  . Smoking status: Never Smoker   . Smokeless tobacco: Never Used  . Alcohol Use: 0.0 oz/week    0 Standard drinks or equivalent per week     Comment: occasional use   Objective:   BP 120/84 mmHg  Pulse 84  Temp(Src) 98.5 F (36.9 C) (Oral)  Resp 16  Ht 5\' 8"  (1.727 m)  Wt 204 lb (92.534 kg)  BMI 31.03 kg/m2  Physical Exam   General Appearance:    Alert, cooperative, no distress  Eyes:    PERRL, conjunctiva/corneas clear, EOM's intact       Lungs:     Occasional expiratory wheeze, no rales,  respirations unlabored  Heart:    Regular rate and rhythm  Neurologic:   Awake, alert, oriented x 3. No apparent focal neurological           defect.           Assessment & Plan:     1. Bronchitis  - azithromycin (ZITHROMAX) 250 MG tablet; 2 by mouth today, then 1 daily until gone  Dispense: 12 tablet; Refill: 0 - albuterol (PROVENTIL HFA;VENTOLIN HFA) 108 (90 Base) MCG/ACT inhaler; Inhale 2 puffs into the lungs every 6 (six) hours as needed for  wheezing or shortness of breath.  Dispense: 1 Inhaler; Refill: 2  Patient Instructions  Return for lung function tests if cough has not completely resolved within 2 weeks, or if cough returns after finishing inhaler and antibiotics.     2. Panic disorder Doing well with prn clonazepam which is refilled today.  - clonazePAM (KLONOPIN) 0.5 MG tablet; TAKE 1 TABLET BY MOUTH UP TO 3 TIMES A DAY AS NEEDED  Dispense: 60 tablet; Refill: 3      The entirety of the information documented in the History of Present Illness, Review of Systems and Physical  Exam were personally obtained by me. Portions of this information were initially documented by April M. Sabra Heck, CMA and reviewed by me for thoroughness and accuracy.    Lelon Huh, MD  [facility]

## 2016-04-19 ENCOUNTER — Encounter: Payer: Self-pay | Admitting: Family Medicine

## 2016-04-19 ENCOUNTER — Ambulatory Visit (INDEPENDENT_AMBULATORY_CARE_PROVIDER_SITE_OTHER): Payer: Commercial Managed Care - HMO | Admitting: Family Medicine

## 2016-04-19 VITALS — BP 156/96 | HR 104 | Temp 98.1°F | Resp 20 | Ht 67.0 in | Wt 203.0 lb

## 2016-04-19 DIAGNOSIS — J40 Bronchitis, not specified as acute or chronic: Secondary | ICD-10-CM | POA: Diagnosis not present

## 2016-04-19 DIAGNOSIS — R05 Cough: Secondary | ICD-10-CM

## 2016-04-19 DIAGNOSIS — R059 Cough, unspecified: Secondary | ICD-10-CM

## 2016-04-19 MED ORDER — AZITHROMYCIN 250 MG PO TABS: ORAL_TABLET | ORAL | 0 refills | Status: AC

## 2016-04-19 NOTE — Progress Notes (Signed)
       Patient: Gerald Boyer Male    DOB: 07-08-1961   55 y.o.   MRN: [id number] Visit Date: 04/19/2016  Today's Provider: Lelon Huh, MD   Chief Complaint  Patient presents with  . Cough   Subjective:    Cough  This is a new problem. The current episode started in the past 7 days. The problem has been gradually worsening. The problem occurs constantly. The cough is productive of sputum. Associated symptoms include nasal congestion, shortness of breath and wheezing. The symptoms are aggravated by pollens. He has tried a beta-agonist inhaler and steroid inhaler for the symptoms. The treatment provided mild relief. His past medical history is significant for environmental allergies.   He states symptoms are very similar to symptoms from June and treated for bronchitis with z-pack and albuterol inhaler. Symptoms resolved until last returning last week.     No Known Allergies Current Meds  Medication Sig  . albuterol (PROVENTIL HFA;VENTOLIN HFA) 108 (90 Base) MCG/ACT inhaler Inhale 2 puffs into the lungs every 6 (six) hours as needed for wheezing or shortness of breath.  Marland Kitchen amLODipine (NORVASC) 2.5 MG tablet TAKE 1 TABLET (2.5 MG TOTAL) BY MOUTH DAILY.  . clonazePAM (KLONOPIN) 0.5 MG tablet TAKE 1 TABLET BY MOUTH UP TO 3 TIMES A DAY AS NEEDED  . Loratadine 10 MG CAPS Take 1 tablet by mouth daily.  Marland Kitchen venlafaxine XR (EFFEXOR-XR) 150 MG 24 hr capsule TAKE 1 CAPSULE (150 MG TOTAL) BY MOUTH DAILY.    Review of Systems  HENT: Positive for congestion.   Respiratory: Positive for cough, shortness of breath and wheezing.   Allergic/Immunologic: Positive for environmental allergies.    Social History  Substance Use Topics  . Smoking status: Never Smoker  . Smokeless tobacco: Never Used  . Alcohol use 0.0 oz/week     Comment: occasional use   Objective:   BP (!) 156/96 (BP Location: Right Arm, Patient Position: Sitting, Cuff Size: Large)   Pulse (!) 104   Temp 98.1 F (36.7 C)  (Oral)   Resp 20   Ht 5\' 7"  (1.702 m)   Wt 203 lb (92.1 kg)   SpO2 95%   BMI 31.79 kg/m   Physical Exam  General Appearance:    Alert, cooperative, no distress  HENT:   ENT exam normal, no neck nodes or sinus tenderness, neck without nodes, sinuses nontender and nasal mucosa congested  Eyes:    PERRL, conjunctiva/corneas clear, EOM's intact       Lungs:     Occasional expiratory wheeze, no rales, , respirations unlabored  Heart:    Regular rate and rhythm  Neurologic:   Awake, alert, oriented x 3. No apparent focal neurological           defect.            Assessment & Plan:     1. Cough Still has inhaler left from June  2. Bronchitis Responded well to azithromycin in the past.        Lelon Huh, MD  [facility]  .

## 2016-05-18 ENCOUNTER — Encounter: Payer: Self-pay | Admitting: Pulmonary Disease

## 2016-05-18 ENCOUNTER — Ambulatory Visit (INDEPENDENT_AMBULATORY_CARE_PROVIDER_SITE_OTHER): Payer: Commercial Managed Care - HMO | Admitting: Pulmonary Disease

## 2016-05-18 VITALS — BP 132/78 | HR 100 | Ht 68.0 in | Wt 204.0 lb

## 2016-05-18 DIAGNOSIS — J45902 Unspecified asthma with status asthmaticus: Secondary | ICD-10-CM

## 2016-05-18 MED ORDER — FLUTICASONE FUROATE-VILANTEROL 200-25 MCG/INH IN AEPB: 1.0000 | INHALATION_SPRAY | Freq: Every day | RESPIRATORY_TRACT | 5 refills | Status: DC

## 2016-05-18 MED ORDER — PREDNISONE 20 MG PO TABS: 40.0000 mg | ORAL_TABLET | Freq: Every day | ORAL | 0 refills | Status: AC

## 2016-05-18 MED ORDER — DOXYCYCLINE HYCLATE 100 MG PO TABS: 100.0000 mg | ORAL_TABLET | Freq: Two times a day (BID) | ORAL | 0 refills | Status: DC

## 2016-05-18 NOTE — Patient Instructions (Addendum)
Your shortness of breath and other symptoms seem to be due to asthma. Your exam today revealed prominent wheezing and your spirometry (lung function test) reveals moderate obstruction (this is the physiological abnormality seen when asthma flares up). The lung function testing should improve with the following treatments  I have prescribed  1) prednisone 40 mg (2 tabs) daily for 7 days 2) doxycycline 100 mg twice a day for 7 days 3) Breo inhaler 200-25 - one inhalation daily  Continue albuterol inhaler as needed for shortness of breath, chest tightness or wheezing

## 2016-05-20 NOTE — Progress Notes (Signed)
PULMONARY CONSULT NOTE  Requesting MD/Service: self referred Date of initial consultation: 05/18/16 Reason for consultation: dyspnea  PT PROFILE: 62 never smoker with 4 months of dyspnea, cough, wheezing and discolored mucus  HPI:  60 M with no prior documented pulmonary history. He is self-referred and presents with 4 months of DOE, wheezing, cough productive of discolored mucus. He was prescribed an albuterol inhaler at an urgent care with 50-75% transient improvment in his symptoms. He reports multiple episodes of "bronchitis" as a child. 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  Past Medical History:  Diagnosis Date  . Allergy   . Anxiety   . Hyperlipidemia   . Hypertension     Past Surgical History:  Procedure Laterality Date  . LIPOMA EXCISION     located on  left shoulder  . PROSTATE BIOPSY  01/2011   outpatient, Dr. Eliberto Ivory; 05/2011- mild chronic inflammation, no dysplasia  . VASECTOMY  1995    MEDICATIONS: I have reviewed all medications and confirmed regimen as documented  Social History   Social History  . Marital status: Married    Spouse name: N/A  . Number of children: 3  . Years of education: N/A   Occupational History  . Inside sales    Social History Main Topics  . Smoking status: Never Smoker  . Smokeless tobacco: Never Used  . Alcohol use 0.0 oz/week     Comment: occasional use  . Drug use: No  . Sexual activity: Not on file   Other Topics Concern  . Not on file   Social History Narrative  . No narrative on file    Family History  Problem Relation Age of Onset  . COPD Mother   . Kidney cancer Mother   . Rectal cancer Daughter   . Hypertension Other     ROS: No fever, myalgias/arthralgias, unexplained weight loss or weight gain No new focal weakness or sensory deficits No otalgia, hearing loss, visual changes, nasal and sinus symptoms, mouth and throat problems No neck pain or adenopathy No abdominal pain, N/V/D, diarrhea,  change in bowel pattern No dysuria, change in urinary pattern   Vitals:   05/18/16 1147  BP: 132/78  Pulse: 100  SpO2: 95%  Weight: 204 lb (92.5 kg)  Height: 5\' 8"  (1.727 m)     EXAM:  Gen: WDWN, No overt respiratory distress HEENT: NCAT, sclera white, oropharynx normal Neck: Supple without LAN, thyromegaly, JVD Lungs: breath sounds: very coarse rattling wheezes, percussion: normal Cardiovascular: RRR, no murmurs noted Abdomen: Soft, nontender, normal BS Ext: without clubbing, cyanosis, edema Neuro: CNs grossly intact, motor and sensory intact Skin: Limited exam, no lesions noted  DATA:   BMP Latest Ref Rng & Units 12/02/2015 03/15/2015 06/26/2014  Glucose 65 - 99 mg/dL 103(H) 105(H) -  BUN 6 - 24 mg/dL 16 18 15   Creatinine 0.76 - 1.27 mg/dL 1.03 0.99 0.9  BUN/Creat Ratio 9 - 20 16 - -  Sodium 134 - 144 mmol/L 137 135 137  Potassium 3.5 - 5.2 mmol/L 4.8 4.0 5.1  Chloride 96 - 106 mmol/L 98 99(L) -  CO2 18 - 29 mmol/L 22 25 -  Calcium 8.7 - 10.2 mg/dL 9.2 9.2 -    CBC Latest Ref Rng & Units 03/15/2015 04/01/2013 03/31/2013  WBC 3.8 - 10.6 K/uL 7.5 6.8 11.2(H)  Hemoglobin 13.0 - 18.0 g/dL 17.1 13.9 14.6  Hematocrit 40.0 - 52.0 % 49.8 40.1 41.5  Platelets 150 - 440 K/uL 294 243  243    CXR (03/15/15): NACPD  Office Spiro (05/18/16): Moderate obstruction (FEV1 2.07, 59% pred)  IMPRESSION:     ICD-9-CM ICD-10-CM   1. Asthma, unspecified asthma severity, with status asthmaticus 493.91 J45.902 Spirometry with Graph     DG Chest 2 View  2> purulent bronchitis  PLAN:  1) prednisone 40 mg (2 tabs) daily for 7 days 2) doxycycline 100 mg twice a day for 7 days 3) Breo inhaler 200-25 - one inhalation daily 4) Continue albuterol inhaler as needed for shortness of breath, chest tightness or wheezing 5) ROV 4 weeks with CXR  Merton Border, MD PCCM service Mobile [phone] Pager [phone] 05/20/2016

## 2016-06-14 ENCOUNTER — Ambulatory Visit
Admission: RE | Admit: 2016-06-14 | Discharge: 2016-06-14 | Disposition: A | Discharge status: Home / Self Care | Payer: Commercial Managed Care - HMO | Source: Ambulatory Visit | Attending: Pulmonary Disease | Admitting: Pulmonary Disease

## 2016-06-14 ENCOUNTER — Ambulatory Visit (INDEPENDENT_AMBULATORY_CARE_PROVIDER_SITE_OTHER): Payer: Commercial Managed Care - HMO | Admitting: Pulmonary Disease

## 2016-06-14 ENCOUNTER — Encounter: Payer: Self-pay | Admitting: Pulmonary Disease

## 2016-06-14 VITALS — BP 152/90 | HR 91 | Ht 67.0 in | Wt 206.0 lb

## 2016-06-14 DIAGNOSIS — R0600 Dyspnea, unspecified: Secondary | ICD-10-CM | POA: Diagnosis present

## 2016-06-14 DIAGNOSIS — J4540 Moderate persistent asthma, uncomplicated: Secondary | ICD-10-CM

## 2016-06-14 DIAGNOSIS — J45902 Unspecified asthma with status asthmaticus: Secondary | ICD-10-CM

## 2016-06-14 NOTE — Progress Notes (Signed)
PULMONARY OFFICE FOLLOW UP NOTE  Requesting MD/Service: self referred Date of initial consultation: 05/18/16 Reason for consultation: dyspnea  PT PROFILE: 37 never smoker with 4 months of dyspnea, cough, wheezing and discolored mucus. Diagnosed with asthma. Initiated on ICS/LABA with excellent response  DATA: Office Spiro (05/18/16): Moderate obstruction (FEV1 2.07, 59% pred) Office Spiro (06/14/16): Very mild obstruction (FEV1 3.02, 90% pred. FEV1/FVC 69%)  SUBJ: Much improved. Denies dyspnea, cough, sputum, chest tightness. Has not used albuterol rescue inhaler for past 3 weeks  OBJ:  Vitals:   06/14/16 0906  BP: (!) 152/90  Pulse: 91  SpO2: 95%  Weight: 206 lb (93.4 kg)  Height: 5\' 7"  (1.702 m)     EXAM:  Gen: NAD HEENT: WNL Lungs: breath sounds normal, no wheezes Cardiovascular: RRR, no murmurs Abdomen: Soft, nontender, normal BS Ext: without clubbing, cyanosis, edema Neuro: grossly intact  DATA:   CXR (06/15/15): Normal  Office Spiro: Very mild obstruction   IMPRESSION:     ICD-9-CM ICD-10-CM   1. Moderate persistent asthma, unspecified whether complicated [id number] Spirometry with Graph   He has responded very well to ICS/LABA combination  PLAN:  1) Continue Breo daily 2) Continue albuterol as needed 3) Follow up in 3 months - if asthma control remains excellent, consider changing to a simple ICS   Merton Border, MD PCCM service Mobile [phone] Pager [phone] 06/14/2016

## 2016-06-14 NOTE — Patient Instructions (Signed)
Continue Breo daily Continue albuterol as needed Follow up in 3 months - if your asthma control remains excellent, we can consider changing to a simple inhaled corticosteroid

## 2016-09-14 DIAGNOSIS — H524 Presbyopia: Secondary | ICD-10-CM | POA: Diagnosis not present

## 2016-11-28 ENCOUNTER — Other Ambulatory Visit: Payer: Self-pay | Admitting: Family Medicine

## 2016-11-28 DIAGNOSIS — F410 Panic disorder [episodic paroxysmal anxiety] without agoraphobia: Secondary | ICD-10-CM

## 2016-11-29 NOTE — Telephone Encounter (Signed)
Please call in clonazepam  

## 2016-11-29 NOTE — Telephone Encounter (Signed)
Rx called in to pharmacy. 

## 2016-12-19 ENCOUNTER — Other Ambulatory Visit: Payer: Self-pay | Admitting: Pulmonary Disease

## 2016-12-29 ENCOUNTER — Other Ambulatory Visit: Payer: Self-pay | Admitting: Family Medicine

## 2017-01-28 ENCOUNTER — Other Ambulatory Visit: Payer: Self-pay | Admitting: Family Medicine

## 2017-02-25 ENCOUNTER — Other Ambulatory Visit: Payer: Self-pay | Admitting: Family Medicine

## 2017-03-07 ENCOUNTER — Telehealth: Payer: Self-pay | Admitting: Family Medicine

## 2017-03-07 ENCOUNTER — Encounter: Payer: Self-pay | Admitting: Family Medicine

## 2017-03-07 ENCOUNTER — Ambulatory Visit (INDEPENDENT_AMBULATORY_CARE_PROVIDER_SITE_OTHER): Payer: 59 | Admitting: Family Medicine

## 2017-03-07 VITALS — BP 120/80 | HR 81 | Temp 98.5°F | Resp 16 | Wt 210.0 lb

## 2017-03-07 DIAGNOSIS — J029 Acute pharyngitis, unspecified: Secondary | ICD-10-CM | POA: Diagnosis not present

## 2017-03-07 DIAGNOSIS — F410 Panic disorder [episodic paroxysmal anxiety] without agoraphobia: Secondary | ICD-10-CM

## 2017-03-07 DIAGNOSIS — J069 Acute upper respiratory infection, unspecified: Secondary | ICD-10-CM | POA: Diagnosis not present

## 2017-03-07 LAB — POCT RAPID STREP A (OFFICE): Rapid Strep A Screen: NEGATIVE

## 2017-03-07 MED ORDER — AMLODIPINE BESYLATE 2.5 MG PO TABS: ORAL_TABLET | ORAL | 2 refills | Status: DC

## 2017-03-07 MED ORDER — VENLAFAXINE HCL ER 150 MG PO CP24: 150.0000 mg | ORAL_CAPSULE | Freq: Every day | ORAL | 2 refills | Status: DC

## 2017-03-07 MED ORDER — CLONAZEPAM 0.5 MG PO TABS: 0.5000 mg | ORAL_TABLET | Freq: Three times a day (TID) | ORAL | 2 refills | Status: DC | PRN

## 2017-03-07 NOTE — Telephone Encounter (Signed)
Please call in clonazepam  

## 2017-03-07 NOTE — Telephone Encounter (Signed)
Rx called in to pharmacy. 

## 2017-03-07 NOTE — Patient Instructions (Signed)

## 2017-03-07 NOTE — Progress Notes (Signed)
Patient: Gerald Boyer Male    DOB: 17-Jul-1961   56 y.o.   MRN: [id number] Visit Date: 03/07/2017  Today's Provider: Lelon Huh, MD   Chief Complaint  Patient presents with  . Sore Throat   Subjective:    Patient started having a sore throat and congestion 2 days ago. Patient stated throat very sore. Having trouble swallowing. Also has symptoms of nasal and chest congestion with productive cough. Patient has been taking otc tylenol and ibuprofen with mild relief.    Sore Throat   This is a new problem. The current episode started in the past 7 days (2 days). The problem has been unchanged. There has been no fever. The pain is at a severity of 6/10. The pain is moderate. Associated symptoms include congestion, coughing, a hoarse voice and trouble swallowing. Pertinent negatives include no abdominal pain, diarrhea, drooling, ear discharge, ear pain, headaches, plugged ear sensation, neck pain, shortness of breath, stridor, swollen glands or vomiting. He has had no exposure to strep or mono. He has tried acetaminophen and NSAIDs for the symptoms. The treatment provided mild relief.       No Known Allergies   Current Outpatient Prescriptions:  .  albuterol (PROVENTIL HFA;VENTOLIN HFA) 108 (90 Base) MCG/ACT inhaler, Inhale 2 puffs into the lungs every 6 (six) hours as needed for wheezing or shortness of breath., Disp: 1 Inhaler, Rfl: 2 .  amLODipine (NORVASC) 2.5 MG tablet, TAKE 1 TABLET (2.5 MG TOTAL) BY MOUTH DAILY. (PT NEEDS TO SCHEDULE OFFICE VISIT), Disp: 30 tablet, Rfl: 0 .  BREO ELLIPTA 200-25 MCG/INH AEPB, INHALE 1 PUFF INTO THE LUNGS DAILY., Disp: 60 each, Rfl: 5 .  clonazePAM (KLONOPIN) 0.5 MG tablet, TAKE 1 TABLET BY MOUTH UP TO 3 TIMES A DAY AS NEEDED, Disp: 60 tablet, Rfl: 5 .  Loratadine 10 MG CAPS, Take 1 tablet by mouth daily., Disp: , Rfl:  .  venlafaxine XR (EFFEXOR-XR) 150 MG 24 hr capsule, TAKE 1 CAPSULE (150 MG TOTAL) BY MOUTH DAILY. (PT NEEDS OFFICE  VISIT), Disp: 30 capsule, Rfl: 0  Review of Systems  Constitutional: Negative for appetite change, chills and fever.  HENT: Positive for congestion, hoarse voice and trouble swallowing. Negative for drooling, ear discharge and ear pain.   Respiratory: Positive for cough. Negative for chest tightness, shortness of breath, wheezing and stridor.   Cardiovascular: Negative for chest pain and palpitations.  Gastrointestinal: Negative for abdominal pain, diarrhea, nausea and vomiting.  Musculoskeletal: Negative for neck pain.  Neurological: Negative for headaches.    Social History  Substance Use Topics  . Smoking status: Never Smoker  . Smokeless tobacco: Never Used  . Alcohol use 0.0 oz/week     Comment: occasional use   Objective:   BP 120/80 (BP Location: Right Arm, Patient Position: Sitting, Cuff Size: Large)   Pulse 81   Temp 98.5 F (36.9 C) (Oral)   Resp 16   Wt 210 lb (95.3 kg)   SpO2 97%   BMI 32.89 kg/m     Physical Exam  General Appearance:    Alert, cooperative, no distress  HENT:   bilateral TM normal without fluid or infection, neck has bilateral anterior cervical nodes enlarged, pharynx erythematous without exudate, sinuses nontender and nasal mucosa pale and congested  Eyes:    PERRL, conjunctiva/corneas clear, EOM's intact       Lungs:     Clear to auscultation bilaterally, respirations unlabored  Heart:    Regular  rate and rhythm  Neurologic:   Awake, alert, oriented x 3. No apparent focal neurological           defect.           Assessment & Plan:     1. Sore throat  - POCT rapid strep A  2. Upper respiratory tract infection, unspecified type Counseled regarding signs and symptoms of viral and bacterial respiratory infections. Advised to call or return for additional evaluation if he develops any sign of bacterial infection, or if current symptoms last longer than 10 days.    He is also out of refills of amlodipine venlafaxine and clonazepam. He  states he is doing well with this. Advised he is due for follow up o.v or CPE. Send in refills to get by until he gets this scheduled.        Lelon Huh, MD  [facility]

## 2017-07-03 ENCOUNTER — Other Ambulatory Visit: Payer: Self-pay | Admitting: Pulmonary Disease

## 2017-07-03 ENCOUNTER — Other Ambulatory Visit: Payer: Self-pay | Admitting: Family Medicine

## 2017-08-02 ENCOUNTER — Other Ambulatory Visit: Payer: Self-pay | Admitting: Pulmonary Disease

## 2017-08-03 ENCOUNTER — Other Ambulatory Visit: Payer: Self-pay | Admitting: Family Medicine

## 2017-08-23 ENCOUNTER — Other Ambulatory Visit: Payer: Self-pay

## 2017-08-23 ENCOUNTER — Ambulatory Visit (INDEPENDENT_AMBULATORY_CARE_PROVIDER_SITE_OTHER): Payer: 59 | Admitting: Family Medicine

## 2017-08-23 ENCOUNTER — Encounter: Payer: Self-pay | Admitting: Family Medicine

## 2017-08-23 VITALS — BP 130/88 | HR 89 | Temp 98.0°F | Resp 16 | Ht 67.0 in | Wt 221.0 lb

## 2017-08-23 DIAGNOSIS — J45909 Unspecified asthma, uncomplicated: Secondary | ICD-10-CM

## 2017-08-23 DIAGNOSIS — E781 Pure hyperglyceridemia: Secondary | ICD-10-CM | POA: Diagnosis not present

## 2017-08-23 DIAGNOSIS — E669 Obesity, unspecified: Secondary | ICD-10-CM | POA: Diagnosis not present

## 2017-08-23 DIAGNOSIS — Z0000 Encounter for general adult medical examination without abnormal findings: Secondary | ICD-10-CM | POA: Diagnosis not present

## 2017-08-23 DIAGNOSIS — Z0001 Encounter for general adult medical examination with abnormal findings: Secondary | ICD-10-CM

## 2017-08-23 DIAGNOSIS — Z86010 Personal history of colonic polyps: Secondary | ICD-10-CM

## 2017-08-23 DIAGNOSIS — I10 Essential (primary) hypertension: Secondary | ICD-10-CM | POA: Diagnosis not present

## 2017-08-23 DIAGNOSIS — Z6834 Body mass index (BMI) 34.0-34.9, adult: Secondary | ICD-10-CM

## 2017-08-23 DIAGNOSIS — Z125 Encounter for screening for malignant neoplasm of prostate: Secondary | ICD-10-CM

## 2017-08-23 LAB — COMPLETE METABOLIC PANEL WITH GFR
AG Ratio: 1.6 (calc) (ref 1.0–2.5)
ALT: 37 U/L (ref 9–46)
AST: 26 U/L (ref 10–35)
Albumin: 3.9 g/dL (ref 3.6–5.1)
Alkaline phosphatase (APISO): 51 U/L (ref 40–115)
BUN: 11 mg/dL (ref 7–25)
CALCIUM: 8.8 mg/dL (ref 8.6–10.3)
CO2: 25 mmol/L (ref 20–32)
CREATININE: 1.01 mg/dL (ref 0.70–1.33)
Chloride: 101 mmol/L (ref 98–110)
GFR, EST NON AFRICAN AMERICAN: 82 mL/min/{1.73_m2} (ref >=60)
GFR, Est African American: 95 mL/min/{1.73_m2} (ref >=60)
GLUCOSE: 112 mg/dL — AB (ref 65–99)
Globulin: 2.5 g/dL (calc) (ref 1.9–3.7)
Potassium: 4.4 mmol/L (ref 3.5–5.3)
Sodium: 136 mmol/L (ref 135–146)
Total Bilirubin: 0.6 mg/dL (ref 0.2–1.2)
Total Protein: 6.4 g/dL (ref 6.1–8.1)

## 2017-08-23 LAB — LIPID PANEL
Cholesterol: 219 mg/dL — ABNORMAL HIGH (ref <200)
HDL: 50 mg/dL (ref >40)
LDL Cholesterol (Calc): 129 mg/dL (calc) — ABNORMAL HIGH
NON-HDL CHOLESTEROL (CALC): 169 mg/dL — AB (ref <130)
Total CHOL/HDL Ratio: 4.4 (calc) (ref <5.0)
Triglycerides: 247 mg/dL — ABNORMAL HIGH (ref <150)

## 2017-08-23 LAB — PSA: PSA: 70.4 ng/mL — AB (ref <=4.0)

## 2017-08-23 MED ORDER — FLUTICASONE FUROATE-VILANTEROL 200-25 MCG/INH IN AEPB: 1.0000 | INHALATION_SPRAY | Freq: Every day | RESPIRATORY_TRACT | 5 refills | Status: DC

## 2017-08-23 NOTE — Patient Instructions (Signed)
The CDC recommends two doses of Shingrix (the shingles vaccine) separated by 2 to 6 months for adults age 57 years and older. I recommend checking with your insurance plan regarding coverage for this vaccine.    Preventive Care 40-64 Years, Male Preventive care refers to lifestyle choices and visits with your health care provider that can promote health and wellness. What does preventive care include?  A yearly physical exam. This is also called an annual well check.  Dental exams once or twice a year.  Routine eye exams. Ask your health care provider how often you should have your eyes checked.  Personal lifestyle choices, including: ? Daily care of your teeth and gums. ? Regular physical activity. ? Eating a healthy diet. ? Avoiding tobacco and drug use. ? Limiting alcohol use. ? Practicing safe sex. ? Taking low-dose aspirin every day starting at age 31. What happens during an annual well check? The services and screenings done by your health care provider during your annual well check will depend on your age, overall health, lifestyle risk factors, and family history of disease. Counseling Your health care provider may ask you questions about your:  Alcohol use.  Tobacco use.  Drug use.  Emotional well-being.  Home and relationship well-being.  Sexual activity.  Eating habits.  Work and work Statistician.  Screening You may have the following tests or measurements:  Height, weight, and BMI.  Blood pressure.  Lipid and cholesterol levels. These may be checked every 5 years, or more frequently if you are over 76 years old.  Skin check.  Lung cancer screening. You may have this screening every year starting at age 77 if you have a 30-pack-year history of smoking and currently smoke or have quit within the past 15 years.  Fecal occult blood test (FOBT) of the stool. You may have this test every year starting at age 58.  Flexible sigmoidoscopy or  colonoscopy. You may have a sigmoidoscopy every 5 years or a colonoscopy every 10 years starting at age 37.  Prostate cancer screening. Recommendations will vary depending on your family history and other risks.  Hepatitis C blood test.  Hepatitis B blood test.  Sexually transmitted disease (STD) testing.  Diabetes screening. This is done by checking your blood sugar (glucose) after you have not eaten for a while (fasting). You may have this done every 1-3 years.  Discuss your test results, treatment options, and if necessary, the need for more tests with your health care provider. Vaccines Your health care provider may recommend certain vaccines, such as:  Influenza vaccine. This is recommended every year.  Tetanus, diphtheria, and acellular pertussis (Tdap, Td) vaccine. You may need a Td booster every 10 years.  Varicella vaccine. You may need this if you have not been vaccinated.  Zoster vaccine. You may need this after age 36.  Measles, mumps, and rubella (MMR) vaccine. You may need at least one dose of MMR if you were born in 1957 or later. You may also need a second dose.  Pneumococcal 13-valent conjugate (PCV13) vaccine. You may need this if you have certain conditions and have not been vaccinated.  Pneumococcal polysaccharide (PPSV23) vaccine. You may need one or two doses if you smoke cigarettes or if you have certain conditions.  Meningococcal vaccine. You may need this if you have certain conditions.  Hepatitis A vaccine. You may need this if you have certain conditions or if you travel or work in places where you may be exposed  to hepatitis A.  Hepatitis B vaccine. You may need this if you have certain conditions or if you travel or work in places where you may be exposed to hepatitis B.  Haemophilus influenzae type b (Hib) vaccine. You may need this if you have certain risk factors.  Talk to your health care provider about which screenings and vaccines you need and  how often you need them. This information is not intended to replace advice given to you by your health care provider. Make sure you discuss any questions you have with your health care provider. Document Released: 09/19/2015 Document Revised: 05/12/2016 Document Reviewed: 06/24/2015 Elsevier Interactive Patient Education  2017 [employer].

## 2017-08-23 NOTE — Progress Notes (Signed)
Patient: Gerald Boyer, Male    DOB: 20-Aug-1961, 56 y.o.   MRN: [id number] Visit Date: 08/23/2017  Today's Provider: Lelon Huh, MD   Chief Complaint  Patient presents with  . Annual Exam  . Hypertension  . Hyperlipidemia   Subjective:    Annual physical exam Gerald Boyer is a 56 y.o. male who presents today for health maintenance and complete physical. He feels well. He reports exercising some. He reports he is sleeping well.  -----------------------------------------------------------------   Hypertension, follow-up:  BP Readings from Last 3 Encounters:  08/23/17 130/88  03/07/17 120/80  06/14/16 (!) 152/90    He was last seen for hypertension 12/02/2015.  BP at that visit was 150/98. Management since that visit includes; advised to work on increasing exercise. Increased amlodipine from 2.5 mg to 5 mg qd .He reports good compliance with treatment. He is not having side effects. none He some exercising. He is not adherent to low salt diet.   Outside blood pressures are not checking. He is experiencing none.  Patient denies none.   Cardiovascular risk factors include advanced age (older than 108 for men, 76 for women).  Use of agents associated with hypertension: none.   ----------------------------------------------------------------    Lipid/Cholesterol, Follow-up:   Last seen for this 12/01/2016.  Management since that visit includes; labs checked, advised to cut back on saturated fats.  Last Lipid Panel:    Component Value Date/Time   CHOL 220 (H) 12/02/2015 1031   TRIG 155 (H) 12/02/2015 1031   HDL 53 12/02/2015 1031   CHOLHDL 4.2 12/02/2015 1031   LDLCALC 136 (H) 12/02/2015 1031    He reports good compliance with treatment. He is not having side effects. none  Wt Readings from Last 3 Encounters:  08/23/17 221 lb (100.2 kg)  03/07/17 210 lb (95.3 kg)  06/14/16 206 lb (93.4 kg)     ----------------------------------------------------------------  Using Breo every day originally prescribed by Dr. Alva Garnet and states it works very well.. Rarely needs albuterol inhaler. He is not planning on follow up with Dr. Alva Garnet due to cost of visit.   Takes 1/2 clonazepam once a day for anxiety and working well.   Drinks EtOh, 1 beer in evening.   Walks every day about 20 minutes.   Review of Systems  Constitutional: Negative.   HENT: Positive for congestion and postnasal drip.   Eyes: Negative.   Respiratory: Positive for shortness of breath and wheezing.   Cardiovascular: Negative.   Gastrointestinal: Negative.   Endocrine: Negative.   Genitourinary: Negative.   Musculoskeletal: Negative.   Skin: Negative.   Allergic/Immunologic: Positive for environmental allergies.  Neurological: Negative.   Hematological: Negative.   Psychiatric/Behavioral: Negative.     Social History      He  reports that  has never smoked. he has never used smokeless tobacco. He reports that he drinks alcohol. He reports that he does not use drugs.       Social History   Socioeconomic History  . Marital status: Married    Spouse name: None  . Number of children: 3  . Years of education: None  . Highest education level: None  Social Needs  . Financial resource strain: None  . Food insecurity - worry: None  . Food insecurity - inability: None  . Transportation needs - medical: None  . Transportation needs - non-medical: None  Occupational History  . Occupation: Engineer, water  Tobacco Use  . Smoking status: Never  Smoker  . Smokeless tobacco: Never Used  Substance and Sexual Activity  . Alcohol use: Yes    Alcohol/week: 0.0 oz    Comment: occasional use  . Drug use: No  . Sexual activity: None  Other Topics Concern  . None  Social History Narrative  . None    Past Medical History:  Diagnosis Date  . Allergy   . Anxiety   . Hyperlipidemia   . Hypertension       Patient Active Problem List   Diagnosis Date Noted  . Elevated prostate specific antigen (PSA) 08/04/2015  . Essential (primary) hypertension 08/04/2015  . Ruptured tympanic membrane 08/04/2015  . 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 Hypertriglyceridemia 06/27/2014  . History of colonic polyps 02/21/2012  . Panic disorder 09/06/2001  . Allergic rhinitis due to pollen 09/06/1998    Past Surgical History:  Procedure Laterality Date  . LIPOMA EXCISION     located on  left shoulder  . PROSTATE BIOPSY  01/2011   outpatient, Dr. Eliberto Ivory; 05/2011- mild chronic inflammation, no dysplasia  . [place] History        Family Status  Relation Name Status  . Mother  Alive  . Father  Alive       prediabetes  . Daughter  (Not Specified)  . Other  (Not Specified)        His family history includes COPD in his mother; Hypertension in his other; Kidney cancer in his mother; Rectal cancer in his daughter.     No Known Allergies   Current Outpatient Medications:  .  albuterol (PROVENTIL HFA;VENTOLIN HFA) 108 (90 Base) MCG/ACT inhaler, Inhale 2 puffs into the lungs every 6 (six) hours as needed for wheezing or shortness of breath., Disp: 1 Inhaler, Rfl: 2 .  amLODipine (NORVASC) 2.5 MG tablet, Take 1 tablet (2.5 mg total) by mouth daily., Disp: 30 tablet, Rfl: 0 .  BREO ELLIPTA 200-25 MCG/INH AEPB, INHALE 1 PUFF INTO THE LUNGS DAILY., Disp: 60 each, Rfl: 0 .  clonazePAM (KLONOPIN) 0.5 MG tablet, Take 1 tablet (0.5 mg total) by mouth 3 (three) times daily as needed for anxiety., Disp: 60 tablet, Rfl: 2 .  Loratadine 10 MG CAPS, Take 1 tablet by mouth daily., Disp: , Rfl:  .  venlafaxine XR (EFFEXOR-XR) 150 MG 24 hr capsule, Take 1 capsule (150 mg total) by mouth daily., Disp: 30 capsule, Rfl: 0   Patient Care Team: Birdie Sons, MD as PCP - General (Family Medicine)      Objective:   Vitals: BP 130/88 (BP Location: Left Arm, Patient Position: Sitting, Cuff Size:  Large)   Pulse 89   Temp 98 F (36.7 C) (Oral)   Resp 16   Ht 5\' 7"  (1.702 m)   Wt 221 lb (100.2 kg)   SpO2 98%   BMI 34.61 kg/m    Vitals:   08/23/17 0931  BP: 130/88  Pulse: 89  Resp: 16  Temp: 98 F (36.7 C)  TempSrc: Oral  SpO2: 98%  Weight: 221 lb (100.2 kg)  Height: 5\' 7"  (1.702 m)     Physical Exam   General Appearance:    Alert, cooperative, no distress, appears stated age  Head:    Normocephalic, without obvious abnormality, atraumatic  Eyes:    PERRL, conjunctiva/corneas clear, EOM's intact, fundi    benign, both eyes       Ears:    Normal TM's and external ear canals, both ears  Nose:   Nares normal, septum midline, mucosa normal, no drainage   or sinus tenderness  Throat:   Lips, mucosa, and tongue normal; teeth and gums normal  Neck:   Supple, symmetrical, trachea midline, no adenopathy;       thyroid:  No enlargement/tenderness/nodules; no carotid   bruit or JVD  Back:     Symmetric, no curvature, ROM normal, no CVA tenderness  Lungs:     Clear to auscultation bilaterally, respirations unlabored  Chest wall:    No tenderness or deformity  Heart:    Regular rate and rhythm, S1 and S2 normal, no murmur, rub   or gallop  Abdomen:     Soft, non-tender, bowel sounds active all four quadrants,    no masses, no organomegaly  Genitalia:    deferred  Rectal:    deferred  Extremities:   Extremities normal, atraumatic, no cyanosis or edema  Pulses:   2+ and symmetric all extremities  Skin:   Skin color, texture, turgor normal, no rashes or lesions  Lymph nodes:   Cervical, supraclavicular, and axillary nodes normal  Neurologic:   CNII-XII intact. Normal strength, sensation and reflexes      throughout    Depression Screen PHQ 2/9 Scores 08/23/2017 12/02/2015  PHQ - 2 Score 0 0  PHQ- 9 Score 0 0      Assessment & Plan:     Routine Health Maintenance and Physical Exam  Exercise Activities and Dietary recommendations Goals    None       Immunization History  Administered Date(s) Administered  . Influenza,inj,Quad PF,6+ Mos 08/05/2015  . Tdap 01/15/2011    Health Maintenance  Topic Date Due  . HIV Screening  10/18/1974  . INFLUENZA VACCINE  04/06/2017  . TETANUS/TDAP  01/14/2021  . COLONOSCOPY  03/02/2022  . Hepatitis C Screening  Completed     Discussed health benefits of physical activity, and encouraged him to engage in regular exercise appropriate for his age and condition.    -------------------------------------------------------------------- 1. Annual physical exam Unremarkable exam  - Lipid panel - PSA - COMPLETE METABOLIC PANEL WITH GFR  2. Essential (primary) hypertension Well controlled.  Continue current medications.   - Lipid panel - COMPLETE METABOLIC PANEL WITH GFR  3. Hypertriglyceridemia Diet controlled  - Lipid panel - COMPLETE METABOLIC PANEL WITH GFR  4. History of colonic polyps Last colonoscopy dr Dionne Milo in 2013 with fragments of tubular adenoma - Ambulatory referral to Gastroenterology  5. Prostate cancer screening  - PSA  6. Uncomplicated asthma, unspecified asthma severity, unspecified whether persistent Doing well with Breo and rarely requiring rescue inhaler, refill today.  - fluticasone furoate-vilanterol (BREO ELLIPTA) 200-25 MCG/INH AEPB; Inhale 1 puff into the lungs daily.  Dispense: 60 each; Refill: 5  7. Class 1 obesity without serious comorbidity with body mass index (BMI) of 34.0 to 34.9 in adult, unspecified obesity type Counseled regarding prudent diet and regular exercise.      Lelon Huh, MD  [facility]

## 2017-08-31 ENCOUNTER — Telehealth: Payer: Self-pay | Admitting: Family Medicine

## 2017-08-31 NOTE — Telephone Encounter (Signed)
Returning call regarding lab results 

## 2017-08-31 NOTE — Progress Notes (Signed)
LMOVm for pt to return call.

## 2017-08-31 NOTE — Telephone Encounter (Signed)
LMOVM for pt to return call 

## 2017-09-03 ENCOUNTER — Other Ambulatory Visit: Payer: Self-pay | Admitting: Family Medicine

## 2017-09-07 NOTE — Telephone Encounter (Signed)
Patient was notified.

## 2017-09-12 ENCOUNTER — Other Ambulatory Visit: Payer: Self-pay | Admitting: Urology

## 2017-09-12 DIAGNOSIS — R972 Elevated prostate specific antigen [PSA]: Secondary | ICD-10-CM | POA: Diagnosis not present

## 2017-09-26 ENCOUNTER — Other Ambulatory Visit: Payer: Self-pay | Admitting: Family Medicine

## 2017-09-26 ENCOUNTER — Ambulatory Visit
Admission: RE | Admit: 2017-09-26 | Discharge: 2017-09-26 | Disposition: A | Discharge status: Home / Self Care | Payer: Commercial Managed Care - HMO | Source: Ambulatory Visit | Attending: Urology | Admitting: Urology

## 2017-09-26 DIAGNOSIS — R972 Elevated prostate specific antigen [PSA]: Secondary | ICD-10-CM

## 2017-09-26 MED ORDER — AMLODIPINE BESYLATE 2.5 MG PO TABS: 2.5000 mg | ORAL_TABLET | Freq: Every day | ORAL | 2 refills | Status: DC

## 2017-09-26 MED ORDER — GADOBENATE DIMEGLUMINE 529 MG/ML IV SOLN
20.0000 mL | Freq: Once | INTRAVENOUS | Status: AC | PRN
  Administered 2017-09-26: 15 mL via INTRAVENOUS

## 2017-09-26 MED ORDER — VENLAFAXINE HCL ER 150 MG PO CP24: ORAL_CAPSULE | ORAL | 2 refills | Status: DC

## 2017-09-26 NOTE — Telephone Encounter (Signed)
Requesting 90 day supply.

## 2017-09-26 NOTE — Telephone Encounter (Addendum)
[facility] pharmacy faxed a refill request for a 90-days supply for the following medications. Thanks CC  amLODipine (NORVASC) 2.5 MG tablet   venlafaxine XR (EFFEXOR-XR) 150 MG 24 hr capsule

## 2017-10-04 DIAGNOSIS — R972 Elevated prostate specific antigen [PSA]: Secondary | ICD-10-CM | POA: Diagnosis not present

## 2017-10-05 ENCOUNTER — Telehealth: Payer: Self-pay

## 2017-10-05 NOTE — Telephone Encounter (Signed)
LMTCB, Need to let patient know we can get him scheduled for Shingrix vaccine, need to make sure he checked with his insurance about coverage. He was on our waiting list-Brittne Kawasaki V Semaja Lymon, RMA

## 2017-10-05 NOTE — Telephone Encounter (Signed)
Patient advised.  He will call insurance and call back to make appointment if his insurance wants him to have it done here.

## 2017-10-13 DIAGNOSIS — R972 Elevated prostate specific antigen [PSA]: Secondary | ICD-10-CM | POA: Diagnosis not present

## 2017-10-13 DIAGNOSIS — C61 Malignant neoplasm of prostate: Secondary | ICD-10-CM | POA: Diagnosis not present

## 2017-10-18 ENCOUNTER — Other Ambulatory Visit: Payer: Self-pay | Admitting: Urology

## 2017-10-18 DIAGNOSIS — C61 Malignant neoplasm of prostate: Secondary | ICD-10-CM

## 2017-10-20 ENCOUNTER — Telehealth: Payer: Self-pay | Admitting: Family Medicine

## 2017-10-20 ENCOUNTER — Encounter: Payer: Self-pay | Admitting: *Deleted

## 2017-10-20 NOTE — Telephone Encounter (Signed)
Please advise patient [facility] is trying to contact him I to schedule colonoscopy and give him phone number. Thanks.

## 2017-11-07 ENCOUNTER — Encounter (HOSPITAL_COMMUNITY)
Admission: RE | Admit: 2017-11-07 | Discharge: 2017-11-07 | Disposition: A | Discharge status: Home / Self Care | Payer: 59 | Source: Ambulatory Visit | Attending: Urology | Admitting: Urology

## 2017-11-07 DIAGNOSIS — C61 Malignant neoplasm of prostate: Secondary | ICD-10-CM | POA: Insufficient documentation

## 2017-11-07 MED ORDER — TECHNETIUM TC 99M MEDRONATE IV KIT
21.6000 | PACK | Freq: Once | INTRAVENOUS | Status: AC | PRN
Start: 2017-11-07 — End: 2017-11-07
  Administered 2017-11-07: 21.6 via INTRAVENOUS

## 2017-11-14 DIAGNOSIS — C61 Malignant neoplasm of prostate: Secondary | ICD-10-CM | POA: Diagnosis not present

## 2017-11-18 ENCOUNTER — Other Ambulatory Visit: Payer: Self-pay | Admitting: Urology

## 2017-11-28 ENCOUNTER — Emergency Department: Payer: Commercial Managed Care - HMO

## 2017-11-28 ENCOUNTER — Inpatient Hospital Stay
Admission: EM | Admit: 2017-11-28 | Discharge: 2017-12-01 | DRG: 392 | Disposition: A | Discharge status: Home / Self Care | Payer: Commercial Managed Care - HMO | Attending: Surgery | Admitting: Surgery

## 2017-11-28 ENCOUNTER — Telehealth: Payer: Self-pay

## 2017-11-28 ENCOUNTER — Other Ambulatory Visit: Payer: Self-pay

## 2017-11-28 ENCOUNTER — Encounter: Payer: Self-pay | Admitting: Emergency Medicine

## 2017-11-28 DIAGNOSIS — K5730 Diverticulosis of large intestine without perforation or abscess without bleeding: Secondary | ICD-10-CM | POA: Diagnosis not present

## 2017-11-28 DIAGNOSIS — I10 Essential (primary) hypertension: Secondary | ICD-10-CM | POA: Diagnosis present

## 2017-11-28 DIAGNOSIS — Z79899 Other long term (current) drug therapy: Secondary | ICD-10-CM | POA: Diagnosis not present

## 2017-11-28 DIAGNOSIS — Z8249 Family history of ischemic heart disease and other diseases of the circulatory system: Secondary | ICD-10-CM | POA: Diagnosis not present

## 2017-11-28 DIAGNOSIS — K5732 Diverticulitis of large intestine without perforation or abscess without bleeding: Secondary | ICD-10-CM

## 2017-11-28 DIAGNOSIS — E785 Hyperlipidemia, unspecified: Secondary | ICD-10-CM | POA: Diagnosis present

## 2017-11-28 DIAGNOSIS — R1032 Left lower quadrant pain: Secondary | ICD-10-CM | POA: Diagnosis not present

## 2017-11-28 DIAGNOSIS — Z7951 Long term (current) use of inhaled steroids: Secondary | ICD-10-CM

## 2017-11-28 DIAGNOSIS — K5720 Diverticulitis of large intestine with perforation and abscess without bleeding: Principal | ICD-10-CM

## 2017-11-28 HISTORY — DX: Diverticulitis of intestine, part unspecified, without perforation or abscess without bleeding: K57.92

## 2017-11-28 LAB — CBC
HCT: 52.6 % — ABNORMAL HIGH (ref 40.0–52.0)
Hemoglobin: 17.6 g/dL (ref 13.0–18.0)
MCH: 33.4 pg (ref 26.0–34.0)
MCHC: 33.5 g/dL (ref 32.0–36.0)
MCV: 99.9 fL (ref 80.0–100.0)
Platelets: 302 10*3/uL (ref 150–440)
RBC: 5.27 MIL/uL (ref 4.40–5.90)
RDW: 13.8 % (ref 11.5–14.5)
WBC: 12.5 10*3/uL — ABNORMAL HIGH (ref 3.8–10.6)

## 2017-11-28 LAB — URINALYSIS, COMPLETE (UACMP) WITH MICROSCOPIC
BILIRUBIN URINE: NEGATIVE
GLUCOSE, UA: NEGATIVE mg/dL
Hgb urine dipstick: NEGATIVE
KETONES UR: NEGATIVE mg/dL
Leukocytes, UA: NEGATIVE
NITRITE: NEGATIVE
PH: 5 (ref 5.0–8.0)
Protein, ur: 30 mg/dL — AB
Specific Gravity, Urine: 1.029 (ref 1.005–1.030)

## 2017-11-28 LAB — COMPREHENSIVE METABOLIC PANEL
ALK PHOS: 64 U/L (ref 38–126)
ALT: 33 U/L (ref 17–63)
ANION GAP: 12 (ref 5–15)
AST: 25 U/L (ref 15–41)
Albumin: 4 g/dL (ref 3.5–5.0)
BILIRUBIN TOTAL: 1.6 mg/dL — AB (ref 0.3–1.2)
BUN: 13 mg/dL (ref 6–20)
CALCIUM: 8.9 mg/dL (ref 8.9–10.3)
CO2: 23 mmol/L (ref 22–32)
Chloride: 102 mmol/L (ref 101–111)
Creatinine, Ser: 1.25 mg/dL — ABNORMAL HIGH (ref 0.61–1.24)
GFR calc non Af Amer: >60 mL/min (ref >60)
Glucose, Bld: 135 mg/dL — ABNORMAL HIGH (ref 65–99)
Potassium: 3.5 mmol/L (ref 3.5–5.1)
SODIUM: 137 mmol/L (ref 135–145)
TOTAL PROTEIN: 7.6 g/dL (ref 6.5–8.1)

## 2017-11-28 LAB — LIPASE, BLOOD: Lipase: 27 U/L (ref 11–51)

## 2017-11-28 MED ORDER — ACETAMINOPHEN 325 MG PO TABS: 650.0000 mg | ORAL_TABLET | Freq: Four times a day (QID) | ORAL | Status: DC | PRN

## 2017-11-28 MED ORDER — SODIUM CHLORIDE 0.9 % IV BOLUS
1000.0000 mL | Freq: Once | INTRAVENOUS | Status: AC
  Administered 2017-11-28: 1000 mL via INTRAVENOUS

## 2017-11-28 MED ORDER — IOPAMIDOL (ISOVUE-300) INJECTION 61%
100.0000 mL | Freq: Once | INTRAVENOUS | Status: AC | PRN
  Administered 2017-11-28: 100 mL via INTRAVENOUS
  Filled 2017-11-28: qty 100

## 2017-11-28 MED ORDER — ACETAMINOPHEN 650 MG RE SUPP: 650.0000 mg | Freq: Four times a day (QID) | RECTAL | Status: DC | PRN

## 2017-11-28 MED ORDER — ONDANSETRON HCL 4 MG/2ML IJ SOLN: 4.0000 mg | Freq: Four times a day (QID) | INTRAMUSCULAR | Status: DC | PRN

## 2017-11-28 MED ORDER — CIPROFLOXACIN IN D5W 400 MG/200ML IV SOLN
400.0000 mg | Freq: Once | INTRAVENOUS | Status: AC
  Administered 2017-11-28: 400 mg via INTRAVENOUS
  Filled 2017-11-28 (×2): qty 200

## 2017-11-28 MED ORDER — ONDANSETRON HCL 4 MG/2ML IJ SOLN
4.0000 mg | Freq: Once | INTRAMUSCULAR | Status: AC
  Administered 2017-11-28: 4 mg via INTRAVENOUS
  Filled 2017-11-28: qty 2

## 2017-11-28 MED ORDER — MORPHINE SULFATE (PF) 4 MG/ML IV SOLN
4.0000 mg | Freq: Once | INTRAVENOUS | Status: AC
  Administered 2017-11-28: 4 mg via INTRAVENOUS
  Filled 2017-11-28: qty 1

## 2017-11-28 MED ORDER — KCL IN DEXTROSE-NACL 20-5-0.45 MEQ/L-%-% IV SOLN
INTRAVENOUS | Status: DC
  Administered 2017-11-28 – 2017-11-29 (×2): via INTRAVENOUS
  Filled 2017-11-28 (×6): qty 1000

## 2017-11-28 MED ORDER — IOPAMIDOL (ISOVUE-300) INJECTION 61%
30.0000 mL | Freq: Once | INTRAVENOUS | Status: DC
  Filled 2017-11-28: qty 30

## 2017-11-28 MED ORDER — METRONIDAZOLE IN NACL 5-0.79 MG/ML-% IV SOLN
500.0000 mg | Freq: Once | INTRAVENOUS | Status: AC
  Administered 2017-11-28: 500 mg via INTRAVENOUS
  Filled 2017-11-28 (×2): qty 100

## 2017-11-28 MED ORDER — ENOXAPARIN SODIUM 40 MG/0.4ML ~~LOC~~ SOLN
40.0000 mg | SUBCUTANEOUS | Status: DC
  Administered 2017-11-29 – 2017-11-30 (×2): 40 mg via SUBCUTANEOUS
  Filled 2017-11-28 (×2): qty 0.4

## 2017-11-28 MED ORDER — CEFTRIAXONE SODIUM 2 G IJ SOLR
2.0000 g | INTRAMUSCULAR | Status: DC
  Administered 2017-11-28 – 2017-11-29 (×2): 2 g via INTRAVENOUS
  Filled 2017-11-28 (×3): qty 20

## 2017-11-28 MED ORDER — KETOROLAC TROMETHAMINE 30 MG/ML IJ SOLN: 30.0000 mg | Freq: Four times a day (QID) | INTRAMUSCULAR | Status: DC | PRN

## 2017-11-28 MED ORDER — ONDANSETRON 4 MG PO TBDP: 4.0000 mg | ORAL_TABLET | Freq: Four times a day (QID) | ORAL | Status: DC | PRN

## 2017-11-28 MED ORDER — KETOROLAC TROMETHAMINE 30 MG/ML IJ SOLN
30.0000 mg | Freq: Four times a day (QID) | INTRAMUSCULAR | Status: DC
  Administered 2017-11-29 – 2017-12-01 (×10): 30 mg via INTRAVENOUS
  Filled 2017-11-28 (×10): qty 1

## 2017-11-28 MED ORDER — MORPHINE SULFATE (PF) 2 MG/ML IV SOLN: 2.0000 mg | INTRAVENOUS | Status: DC | PRN

## 2017-11-28 MED ORDER — METRONIDAZOLE IN NACL 5-0.79 MG/ML-% IV SOLN
500.0000 mg | Freq: Three times a day (TID) | INTRAVENOUS | Status: DC
  Administered 2017-11-29 – 2017-11-30 (×4): 500 mg via INTRAVENOUS
  Filled 2017-11-28 (×6): qty 100

## 2017-11-28 NOTE — ED Provider Notes (Signed)
[facility] Emergency Department Provider Note  Time seen: 5:23 PM  I have reviewed the triage vital signs and the nursing notes.   HISTORY  Chief Complaint Abdominal Pain    HPI Gerald Boyer is a 57 y.o. male with a past medical history of hypertension, hyperlipidemia, diverticulitis 3 years ago requiring hospitalization, presents the emergency department for left lower quadrant abdominal pain.  According to the patient around 11:00 yesterday morning he developed left lower quadrant abdominal discomfort which he describes as a moderate dull aching type pain.  States the pain is somewhat similar to his last episode of diverticulitis.  Denies any nausea vomiting or diarrhea.  Denies any black or bloody stool.  Denies any fever.  Largely negative review of systems otherwise.   Past Medical History:  Diagnosis Date  . Allergy   . Anxiety   . Diverticulitis   . Hyperlipidemia   . Hypertension     Patient Active Problem List   Diagnosis Date Noted  . Obesity 08/23/2017  . Elevated prostate specific antigen (PSA) 08/04/2015  . Essential (primary) hypertension 08/04/2015  . Ruptured tympanic membrane 08/04/2015  . 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 Hypertriglyceridemia 06/27/2014  . History of colonic polyps 02/21/2012  . Panic disorder 09/06/2001  . Allergic rhinitis due to pollen 09/06/1998    Past Surgical History:  Procedure Laterality Date  . LIPOMA EXCISION     located on  left shoulder  . PROSTATE BIOPSY  01/2011   outpatient, Dr. Eliberto Ivory; 05/2011- mild chronic inflammation, no dysplasia  . VASECTOMY  1995    Prior to Admission medications   Medication Sig Start Date End Date Taking? Authorizing Provider  albuterol (PROVENTIL HFA;VENTOLIN HFA) 108 (90 Base) MCG/ACT inhaler Inhale 2 puffs into the lungs every 6 (six) hours as needed for wheezing or shortness of breath. 02/12/16   Birdie Sons, MD  amLODipine (NORVASC) 2.5 MG tablet Take  1 tablet (2.5 mg total) by mouth daily. 09/26/17   Birdie Sons, MD  clonazePAM (KLONOPIN) 0.5 MG tablet Take 1 tablet (0.5 mg total) by mouth 3 (three) times daily as needed for anxiety. 03/07/17   Birdie Sons, MD  fluticasone furoate-vilanterol (BREO ELLIPTA) 200-25 MCG/INH AEPB Inhale 1 puff into the lungs daily. 08/23/17   Birdie Sons, MD  Loratadine 10 MG CAPS Take 1 tablet by mouth daily.    [provider]  venlafaxine XR (EFFEXOR-XR) 150 MG 24 hr capsule TAKE 1 CAPSULE BY MOUTH EVERY DAY 09/26/17   Birdie Sons, MD    No Known Allergies  Family History  Problem Relation Age of Onset  . COPD Mother   . Kidney cancer Mother   . Rectal cancer Daughter   . Hypertension Other     Social History Social History   Tobacco Use  . Smoking status: Never Smoker  . Smokeless tobacco: Never Used  Substance Use Topics  . Alcohol use: Yes    Alcohol/week: 0.0 oz    Comment: occasional use  . Drug use: No    Review of Systems Constitutional: Negative for fever. Eyes: Negative for visual complaints ENT: Negative for recent illness/congestion Cardiovascular: Negative for chest pain. Respiratory: Negative for shortness of breath. Gastrointestinal: Moderate left lower quadrant dull aching pain.  Negative for nausea vomiting or diarrhea. Genitourinary: States somewhat dark urine but no clear hematuria.  No dysuria. Musculoskeletal: Negative for musculoskeletal complaints Skin: Negative for skin complaints  Neurological: Negative for headache All other ROS  negative  ____________________________________________   PHYSICAL EXAM:  VITAL SIGNS: ED Triage Vitals [11/28/17 1622]  Enc Vitals Group     BP (!) 154/107     Pulse Rate (!) 115     Resp 20     Temp 98.5 F (36.9 C)     Temp Source Oral     SpO2 97 %     Weight 210 lb (95.3 kg)     Height 5\' 7"  (1.702 m)     Head Circumference      Peak Flow      Pain Score 6     Pain Loc      Pain Edu?       Excl. in [place]?     Constitutional: Alert and oriented. Well appearing and in no distress. Eyes: Normal exam ENT   Head: Normocephalic and atraumatic.   Mouth/Throat: Mucous membranes are moist. Cardiovascular: Normal rate, regular rhythm. No murmur Respiratory: Normal respiratory effort without tachypnea nor retractions. Breath sounds are clear  Gastrointestinal: Soft, moderate left lower quadrant and suprapubic tenderness to palpation.  No rebound or guarding.  No distention. Musculoskeletal: Nontender with normal range of motion in all extremities.  Neurologic:  Normal speech and language. No gross focal neurologic deficits Skin:  Skin is warm, dry and intact.  Psychiatric: Mood and affect are normal. Speech and behavior are normal.   ____________________________________________    RADIOLOGY  CT consistent with diverticulitis with microperforations.  ____________________________________________   INITIAL IMPRESSION / ASSESSMENT AND PLAN / ED COURSE  Pertinent labs & imaging results that were available during my care of the patient were reviewed by me and considered in my medical decision making (see chart for details).  Patient presents to the emergency department for left lower quadrant abdominal pain.  Differential would include diverticulitis, colitis, urinary tract infection or pyelonephritis, constipation.  Patient's labs are resulted showing a mild leukocytosis with mild renal insufficiency.  Patient has moderate left lower quadrant tenderness to palpation.  Highly suspect recurrent diverticulitis will obtain CT imaging to further evaluate.  Patient agreeable to this plan of care.  Rates his pain as a 5/10 currently does not wish for any pain medication.  Denies nausea.  CT consistent with diverticulitis with microperforations.  Discussed with general surgery will be admitting the patient for further treatment.  Ordered IV ciprofloxacin and Flagyl for the patient.   Discussed these findings with the patient and plan of care he is agreeable.  ____________________________________________   FINAL CLINICAL IMPRESSION(S) / ED DIAGNOSES  Left lower quadrant abdominal pain Complicated diverticulitis   Harvest Dark, MD 11/28/17 2311

## 2017-11-28 NOTE — ED Notes (Signed)
Patient transported to 213

## 2017-11-28 NOTE — H&P (Signed)
SURGICAL HISTORY & PHYSICAL (cpt [phone])  HISTORY OF PRESENT ILLNESS (HPI):  57 y.o. male presented to [facility] ED this evening for abdominal pain. Patient reports he developed yesterday morning (~11 am) moderate aching LLQ abdominal pain, which is similar to the pain he previously experienced with an episode of acute sigmoid colonic diverticulitis 3 years ago, which required hospitalization with IV antibiotics. Patient reports +flatus and +BM's, denies any history of constipation, N/V, fever/chills, CP, or SOB. Having just experienced similar 3 years ago with no history of constipation, patient expresses he doesn't want to continue experiencing the same and isOf note, he was recently diagnosed with prostate cancer and is scheduled for surgery 4/26.  PAST MEDICAL HISTORY (PMH):  Past Medical History:  Diagnosis Date  . Allergy   . Anxiety   . Diverticulitis   . Hyperlipidemia   . Hypertension     Reviewed. Otherwise negative.   PAST SURGICAL HISTORY ([place]):  Past Surgical History:  Procedure Laterality Date  . LIPOMA EXCISION     located on  left shoulder  . PROSTATE BIOPSY  01/2011   outpatient, Dr. Eliberto Ivory; 05/2011- mild chronic inflammation, no dysplasia  . VASECTOMY  1995    Reviewed. Otherwise negative.   MEDICATIONS:  Prior to Admission medications   Medication Sig Start Date End Date Taking? Authorizing Provider  albuterol (PROVENTIL HFA;VENTOLIN HFA) 108 (90 Base) MCG/ACT inhaler Inhale 2 puffs into the lungs every 6 (six) hours as needed for wheezing or shortness of breath. 02/12/16  Yes Birdie Sons, MD  amLODipine (NORVASC) 2.5 MG tablet Take 1 tablet (2.5 mg total) by mouth daily. 09/26/17  Yes Birdie Sons, MD  clonazePAM (KLONOPIN) 0.5 MG tablet Take 1 tablet (0.5 mg total) by mouth 3 (three) times daily as needed for anxiety. Patient taking differently: Take 0.25 mg by mouth 3 (three) times daily as needed for anxiety.  03/07/17  Yes Birdie Sons, MD  fluticasone  (FLONASE) 50 MCG/ACT nasal spray Place 2 sprays into both nostrils daily as needed for allergies or rhinitis.   Yes [provider]  fluticasone furoate-vilanterol (BREO ELLIPTA) 200-25 MCG/INH AEPB Inhale 1 puff into the lungs daily. 08/23/17  Yes Birdie Sons, MD  Loratadine 10 MG CAPS Take 1 tablet by mouth daily as needed (allergies).    Yes [provider]  venlafaxine XR (EFFEXOR-XR) 150 MG 24 hr capsule TAKE 1 CAPSULE BY MOUTH EVERY DAY 09/26/17  Yes Fisher, Kirstie Peri, MD     ALLERGIES:  No Known Allergies   SOCIAL HISTORY:  Social History   Socioeconomic History  . Marital status: Married    Spouse name: Not on file  . Number of children: 3  . Years of education: Not on file  . Highest education level: Not on file  Occupational History  . Occupation: Engineer, water  Social Needs  . Financial resource strain: Not on file  . Food insecurity:    Worry: Not on file    Inability: Not on file  . Transportation needs:    Medical: Not on file    Non-medical: Not on file  Tobacco Use  . Smoking status: Never Smoker  . Smokeless tobacco: Never Used  Substance and Sexual Activity  . Alcohol use: Yes    Alcohol/week: 0.0 oz    Comment: occasional use  . Drug use: No  . Sexual activity: Not on file  Lifestyle  . Physical activity:    Days per week: Not on file  Minutes per session: Not on file  . Stress: Not on file  Relationships  . Social connections:    Talks on phone: Not on file    Gets together: Not on file    Attends religious service: Not on file    Active member of club or organization: Not on file    Attends meetings of clubs or organizations: Not on file    Relationship status: Not on file  . Intimate partner violence:    Fear of current or ex partner: Not on file    Emotionally abused: Not on file    Physically abused: Not on file    Forced sexual activity: Not on file  Other Topics Concern  . Not on file  Social History Narrative   . Not on file    The patient currently resides (home / rehab facility / nursing home): Home The patient normally is (ambulatory / bedbound): Ambulatory  FAMILY HISTORY:  Family History  Problem Relation Age of Onset  . COPD Mother   . Kidney cancer Mother   . Rectal cancer Daughter   . Hypertension Other     Otherwise negative.   REVIEW OF SYSTEMS:  Constitutional: denies any other weight loss, fever, chills, or sweats  Eyes: denies any other vision changes, history of eye injury  ENT: denies sore throat, hearing problems  Respiratory: denies shortness of breath, wheezing  Cardiovascular: denies chest pain, palpitations  Gastrointestinal: abdominal pain, N/V, and bowel function as per HPI  Genitourinary: denies burning with urination or urinary frequency Musculoskeletal: denies any other joint pains or cramps  Skin: Denies any other rashes or skin discolorations  Neurological: denies any other headache, dizziness, weakness  Psychiatric: denies any other depression, anxiety   All other review of systems were otherwise negative.  VITAL SIGNS:  Temp:  [98.5 F (36.9 C)] 98.5 F (36.9 C) (03/25 1622) Pulse Rate:  [115] 115 (03/25 1622) Resp:  [20] 20 (03/25 1622) BP: (154)/(107) 154/107 (03/25 1622) SpO2:  [97 %] 97 % (03/25 1622) Weight:  [210 lb (95.3 kg)] 210 lb (95.3 kg) (03/25 1622)     Height: 5\' 7"  (170.2 cm) Weight: 210 lb (95.3 kg) BMI (Calculated): 32.88   INTAKE/OUTPUT:  This shift: No intake/output data recorded.  Last 2 shifts: @IOLAST2SHIFTS @  PHYSICAL EXAM:  Constitutional:  -- Overweight body habitus  -- Awake, alert, and oriented x3, no apparent distress Eyes:  -- Pupils equally round and reactive to light  -- No scleral icterus, B/L no occular discharge Ear, nose, throat: -- Neck is FROM WNL -- No jugular venous distension  Pulmonary:  -- No wheezes or rhales -- Equal breath sounds bilaterally -- Breathing non-labored at rest Cardiovascular:   -- S1, S2 present  -- No pericardial rubs  Gastrointestinal:  -- Abdomen soft and non-distended with moderate LLQ abdominal tenderness to palpation, no guarding or rebound tenderness -- No abdominal masses appreciated, pulsatile or otherwise  Musculoskeletal and Integumentary:  -- Wounds or skin discoloration: None appreciated -- Extremities: B/L UE and LE FROM, hands and feet warm, no edema  Neurologic:  -- Motor function: Intact and symmetric -- Sensation: Intact and symmetric Psychiatric:  -- Mood and affect WNL  Labs:  CBC Latest Ref Rng & Units 11/28/2017 03/15/2015 04/01/2013  WBC 3.8 - 10.6 K/uL 12.5(H) 7.5 6.8  Hemoglobin 13.0 - 18.0 g/dL 17.6 17.1 13.9  Hematocrit 40.0 - 52.0 % 52.6(H) 49.8 40.1  Platelets 150 - 440 K/uL 302 294 243  CMP Latest Ref Rng & Units 11/28/2017 08/23/2017 12/02/2015  Glucose 65 - 99 mg/dL 135(H) 112(H) 103(H)  BUN 6 - 20 mg/dL 13 11 16   Creatinine 0.61 - 1.24 mg/dL 1.25(H) 1.01 1.03  Sodium 135 - 145 mmol/L 137 136 137  Potassium 3.5 - 5.1 mmol/L 3.5 4.4 4.8  Chloride 101 - 111 mmol/L 102 101 98  CO2 22 - 32 mmol/L 23 25 22   Calcium 8.9 - 10.3 mg/dL 8.9 8.8 9.2  Total Protein 6.5 - 8.1 g/dL 7.6 6.4 6.6  Total Bilirubin 0.3 - 1.2 mg/dL 1.6(H) 0.6 0.3  Alkaline Phos 38 - 126 U/L 64 - 59  AST 15 - 41 U/L 25 26 39  ALT 17 - 63 U/L 33 37 42   Imaging studies:  CT Abdomen and Pelvis with IV Contrast (11/28/2017) - personally reviewed and discussed with patient and his wife Left colonic diverticulosis. Significant inflammatory stranding around the sigmoid colon with small locules of extraluminal gas compatible with diverticular micro perforation and diverticulitis.  Assessment/Plan: (ICD-10's: K36.22) 57 year old Male with recurrnent acute sigmoid colonic diverticulitis with significant pericolonic inflammation without abscess, complicated by pertinent comorbidities including obesity (BMI 33), prostate cancer pending surgery, HTN, HLD, and no  history of constipation to modify.    - clear liquids diet, IVF  - pain control prn (minimize narcotics)  - IV antibiotics (ceftriaxone and metronidazole)  - will admit to surgical service and monitor abdominal exam and bowel function  - if resolves this episode without surgery, risks, benefits, and alternatives to elective sigmoid colectomy discussed  - possibility of surgery with partial colectomy and likely colostomy also discussed if doesn't improve/resolve with antibiotics  - DVT prophylaxis, ambulation encouraged  All of the above findings and recommendations were discussed with the patient and his wife, and all of their questions were answered to their expressed satisfaction.  -- Marilynne Drivers Rosana Hoes, MD, [place]: [facility] - Partnering for exceptional care. Office: [phone]

## 2017-11-28 NOTE — ED Notes (Signed)
Pt given water by RN

## 2017-11-28 NOTE — Telephone Encounter (Signed)
Spoke with patient at this time. He wanted to schedule an appointment with one of the surgeons.  He has a history of diverticulitis with perforation. He last seen Dr.Ely 2014.  He stated he started having abdominal pain yesterday and chills today. We do not have a doctor in office 11/29/17 and he was instructed to be seen in the emergency department due to his history. Patient agreed. No appointment was made due to patient going to emergency room.

## 2017-11-28 NOTE — ED Triage Notes (Signed)
Pt reports that he is having LLQ pain, denies N/V/D, but has had some chills without fever. He reports that he has a history of diverticulitis and had to be admitted. MD office did not have any appointments and told him to come here for evaluation

## 2017-11-28 NOTE — ED Notes (Signed)
Report called to [facility] rn floor nurse.  

## 2017-11-29 ENCOUNTER — Other Ambulatory Visit: Payer: Self-pay

## 2017-11-29 DIAGNOSIS — K5720 Diverticulitis of large intestine with perforation and abscess without bleeding: Principal | ICD-10-CM

## 2017-11-29 LAB — BASIC METABOLIC PANEL
ANION GAP: 8 (ref 5–15)
BUN: 13 mg/dL (ref 6–20)
CALCIUM: 8 mg/dL — AB (ref 8.9–10.3)
CHLORIDE: 105 mmol/L (ref 101–111)
CO2: 21 mmol/L — AB (ref 22–32)
Creatinine, Ser: 1.05 mg/dL (ref 0.61–1.24)
GFR calc non Af Amer: >60 mL/min (ref >60)
Glucose, Bld: 114 mg/dL — ABNORMAL HIGH (ref 65–99)
Potassium: 3.6 mmol/L (ref 3.5–5.1)
Sodium: 134 mmol/L — ABNORMAL LOW (ref 135–145)

## 2017-11-29 LAB — CBC
HEMATOCRIT: 45.1 % (ref 40.0–52.0)
Hemoglobin: 15.5 g/dL (ref 13.0–18.0)
MCH: 33.9 pg (ref 26.0–34.0)
MCHC: 34.3 g/dL (ref 32.0–36.0)
MCV: 99 fL (ref 80.0–100.0)
PLATELETS: 226 10*3/uL (ref 150–440)
RBC: 4.56 MIL/uL (ref 4.40–5.90)
RDW: 13 % (ref 11.5–14.5)
WBC: 8.2 10*3/uL (ref 3.8–10.6)

## 2017-11-29 MED ORDER — SODIUM CHLORIDE 0.9 % IV BOLUS
1000.0000 mL | Freq: Once | INTRAVENOUS | Status: AC
  Administered 2017-11-29: 1000 mL via INTRAVENOUS

## 2017-11-29 NOTE — Discharge Instructions (Signed)
In addition to included general instructions for Diverticulitis,  Diet: Maintain adequate hydration. Resume home heart healthy diet, starting with easy to digest low-fiber foods x 6 weeks, followed by ongoing high fiber diet.  Activity: Light activity and walking are encouraged.  Medications: Resume all home medications AND Complete prescribed course of antibiotics even if feeling better/well. Do not drink alcohol while taking antibiotics.  Call office [phone]) at any time if any questions, worsening pain, fevers/chills, or other concerns.

## 2017-11-29 NOTE — Plan of Care (Signed)
Pt has had a minimal amount of pain. Ambulating in room

## 2017-11-29 NOTE — Progress Notes (Signed)
CC: diverticulitis Subjective: Feeling better, some pain, taking clears AVSS Objective: Vital signs in last 24 hours: Temp:  [97.7 F (36.5 C)-98.6 F (37 C)] 97.7 F (36.5 C) (03/26 0812) Pulse Rate:  [65-115] 65 (03/26 0812) Resp:  [18-20] 18 (03/26 0537) BP: (119-154)/(75-107) 126/82 (03/26 0812) SpO2:  [97 %-99 %] 98 % (03/26 0812) Weight:  [95.3 kg (210 lb)] 95.3 kg (210 lb) (03/25 1622) Last BM Date: 11/28/17  Intake/Output from previous day: 03/25 0701 - 03/26 0700 In: 900 [I.V.:700; IV Piggyback:200] Out: 0  Intake/Output this shift: Total I/O In: 600 [P.O.:600] Out: 300 [Urine:300]  Physical exam:  NAD, alert Abd: soft, mild ttp LLQ, no peritonitis Ext: well perfused and no edema  Lab Results: CBC  Recent Labs    11/28/17 1625 11/29/17 0538  WBC 12.5* 8.2  HGB 17.6 15.5  HCT 52.6* 45.1  PLT 302 226   BMET Recent Labs    11/28/17 1625 11/29/17 0538  NA 137 134*  K 3.5 3.6  CL 102 105  CO2 23 21*  GLUCOSE 135* 114*  BUN 13 13  CREATININE 1.25* 1.05  CALCIUM 8.9 8.0*   PT/INR No results for input(s): LABPROT, INR in the last 72 hours. ABG No results for input(s): PHART, HCO3 in the last 72 hours.  Invalid input(s): PCO2, PO2  Studies/Results: Ct Abdomen Pelvis W Contrast  Result Date: 11/28/2017 CLINICAL DATA:  Left lower quadrant pain EXAM: CT ABDOMEN AND PELVIS WITH CONTRAST TECHNIQUE: Multidetector CT imaging of the abdomen and pelvis was performed using the standard protocol following bolus administration of intravenous contrast. CONTRAST:  177mL ISOVUE-300 IOPAMIDOL (ISOVUE-300) INJECTION 61% COMPARISON:  11/07/2017 FINDINGS: Lower chest: Bibasilar scarring. Heart is normal size. No effusions. Hepatobiliary: Fatty infiltration of the liver. No focal abnormality or biliary ductal dilatation. Gallbladder unremarkable. Pancreas: No focal abnormality or ductal dilatation. Spleen: No focal abnormality.  Normal size. Adrenals/Urinary Tract: No  adrenal abnormality. No focal renal abnormality. No stones or hydronephrosis. Urinary bladder is unremarkable. Small cyst in the midpole of the right kidney. Stomach/Bowel: Sigmoid and descending colonic diverticulosis. Inflammatory stranding noted around the sigmoid colon with small extraluminal locules of gas compatible with contained diverticular perforation and diverticulitis. No evidence of bowel obstruction. Vascular/Lymphatic: No evidence of aneurysm or adenopathy. Reproductive: Mildly prominent prostate Other: No free fluid or free air. Musculoskeletal: Degenerative changes in the lumbar spine. No acute bony abnormality. IMPRESSION: Left colonic diverticulosis. Significant inflammatory stranding around the sigmoid colon with small locules of extraluminal gas compatible with diverticular micro perforation and diverticulitis. Fatty infiltration of the liver. Electronically Signed   By: Rolm Baptise M.D.   On: 11/28/2017 18:34    Anti-infectives: Anti-infectives (From admission, onward)   Start     Dose/Rate Route Frequency Ordered Stop   11/29/17 0500  metroNIDAZOLE (FLAGYL) IVPB 500 mg     500 mg 100 mL/hr over 60 Minutes Intravenous Every 8 hours 11/28/17 2042     11/28/17 2200  cefTRIAXone (ROCEPHIN) 2 g in sodium chloride 0.9 % 100 mL IVPB     2 g 200 mL/hr over 30 Minutes Intravenous Every 24 hours 11/28/17 2042     11/28/17 1845  ciprofloxacin (CIPRO) IVPB 400 mg     400 mg 200 mL/hr over 60 Minutes Intravenous  Once 11/28/17 1839 11/28/17 2059   11/28/17 1845  metroNIDAZOLE (FLAGYL) IVPB 500 mg     500 mg 100 mL/hr over 60 Minutes Intravenous  Once 11/28/17 1839 11/28/17 2308  Assessment/Plan: Diverticulitis w microperf Continue A/Bs Clears only No surgical intervention at this time HE will likely benefit from elective colectomy in the future given the recurrence, age and microperforation  Caroleen Hamman, MD, FACS  11/29/2017

## 2017-11-30 LAB — CBC
HCT: 41.5 % (ref 40.0–52.0)
Hemoglobin: 14.4 g/dL (ref 13.0–18.0)
MCH: 34.4 pg — AB (ref 26.0–34.0)
MCHC: 34.6 g/dL (ref 32.0–36.0)
MCV: 99.5 fL (ref 80.0–100.0)
PLATELETS: 221 10*3/uL (ref 150–440)
RBC: 4.17 MIL/uL — AB (ref 4.40–5.90)
RDW: 13.4 % (ref 11.5–14.5)
WBC: 6.4 10*3/uL (ref 3.8–10.6)

## 2017-11-30 LAB — HIV ANTIBODY (ROUTINE TESTING W REFLEX): HIV Screen 4th Generation wRfx: NONREACTIVE

## 2017-11-30 LAB — BASIC METABOLIC PANEL
Anion gap: 8 (ref 5–15)
BUN: 8 mg/dL (ref 6–20)
CO2: 24 mmol/L (ref 22–32)
CREATININE: 0.88 mg/dL (ref 0.61–1.24)
Calcium: 8.1 mg/dL — ABNORMAL LOW (ref 8.9–10.3)
Chloride: 104 mmol/L (ref 101–111)
GFR calc Af Amer: >60 mL/min (ref >60)
GLUCOSE: 120 mg/dL — AB (ref 65–99)
Potassium: 3.8 mmol/L (ref 3.5–5.1)
SODIUM: 136 mmol/L (ref 135–145)

## 2017-11-30 MED ORDER — CIPROFLOXACIN HCL 500 MG PO TABS
500.0000 mg | ORAL_TABLET | Freq: Two times a day (BID) | ORAL | Status: DC
  Administered 2017-11-30 – 2017-12-01 (×3): 500 mg via ORAL
  Filled 2017-11-30 (×3): qty 1

## 2017-11-30 MED ORDER — METRONIDAZOLE 500 MG PO TABS
500.0000 mg | ORAL_TABLET | Freq: Three times a day (TID) | ORAL | Status: DC
  Administered 2017-11-30 – 2017-12-01 (×3): 500 mg via ORAL
  Filled 2017-11-30 (×4): qty 1

## 2017-11-30 NOTE — Plan of Care (Signed)
Pts pain has been minimal. Switched to oral ABX

## 2017-11-30 NOTE — Progress Notes (Signed)
CC: Diverticulitis Subjective:\ Feeling better.  Some intermittent pain.  Tolerating clear liquids  Objective: Vital signs in last 24 hours: Temp:  [97.4 F (36.3 C)-97.9 F (36.6 C)] 97.4 F (36.3 C) (03/27 0457) Pulse Rate:  [67-86] 67 (03/27 0457) Resp:  [20] 20 (03/27 0457) BP: (127-141)/(87-95) 141/95 (03/27 0457) SpO2:  [95 %-99 %] 97 % (03/27 0457) Last BM Date: 11/29/17  Intake/Output from previous day: 03/26 0701 - 03/27 0700 In: 5762 [P.O.:2280; I.V.:3182; IV Piggyback:300] Out: 2700 [Urine:2700] Intake/Output this shift: No intake/output data recorded.  Physical exam: NAD, alert GEX:BMWU, maild TTP llq, no peritonitis. Ext: well perfused and no edema   Lab Results: CBC  Recent Labs    11/29/17 0538 11/30/17 0452  WBC 8.2 6.4  HGB 15.5 14.4  HCT 45.1 41.5  PLT 226 221   BMET Recent Labs    11/29/17 0538 11/30/17 0452  NA 134* 136  K 3.6 3.8  CL 105 104  CO2 21* 24  GLUCOSE 114* 120*  BUN 13 8  CREATININE 1.05 0.88  CALCIUM 8.0* 8.1*   PT/INR No results for input(s): LABPROT, INR in the last 72 hours. ABG No results for input(s): PHART, HCO3 in the last 72 hours.  Invalid input(s): PCO2, PO2  Studies/Results: Ct Abdomen Pelvis W Contrast  Result Date: 11/28/2017 CLINICAL DATA:  Left lower quadrant pain EXAM: CT ABDOMEN AND PELVIS WITH CONTRAST TECHNIQUE: Multidetector CT imaging of the abdomen and pelvis was performed using the standard protocol following bolus administration of intravenous contrast. CONTRAST:  146mL ISOVUE-300 IOPAMIDOL (ISOVUE-300) INJECTION 61% COMPARISON:  11/07/2017 FINDINGS: Lower chest: Bibasilar scarring. Heart is normal size. No effusions. Hepatobiliary: Fatty infiltration of the liver. No focal abnormality or biliary ductal dilatation. Gallbladder unremarkable. Pancreas: No focal abnormality or ductal dilatation. Spleen: No focal abnormality.  Normal size. Adrenals/Urinary Tract: No adrenal abnormality. No focal renal  abnormality. No stones or hydronephrosis. Urinary bladder is unremarkable. Small cyst in the midpole of the right kidney. Stomach/Bowel: Sigmoid and descending colonic diverticulosis. Inflammatory stranding noted around the sigmoid colon with small extraluminal locules of gas compatible with contained diverticular perforation and diverticulitis. No evidence of bowel obstruction. Vascular/Lymphatic: No evidence of aneurysm or adenopathy. Reproductive: Mildly prominent prostate Other: No free fluid or free air. Musculoskeletal: Degenerative changes in the lumbar spine. No acute bony abnormality. IMPRESSION: Left colonic diverticulosis. Significant inflammatory stranding around the sigmoid colon with small locules of extraluminal gas compatible with diverticular micro perforation and diverticulitis. Fatty infiltration of the liver. Electronically Signed   By: Rolm Baptise M.D.   On: 11/28/2017 18:34    Anti-infectives: Anti-infectives (From admission, onward)   Start     Dose/Rate Route Frequency Ordered Stop   11/29/17 0500  metroNIDAZOLE (FLAGYL) IVPB 500 mg     500 mg 100 mL/hr over 60 Minutes Intravenous Every 8 hours 11/28/17 2042     11/28/17 2200  cefTRIAXone (ROCEPHIN) 2 g in sodium chloride 0.9 % 100 mL IVPB     2 g 200 mL/hr over 30 Minutes Intravenous Every 24 hours 11/28/17 2042     11/28/17 1845  ciprofloxacin (CIPRO) IVPB 400 mg     400 mg 200 mL/hr over 60 Minutes Intravenous  Once 11/28/17 1839 11/28/17 2059   11/28/17 1845  metroNIDAZOLE (FLAGYL) IVPB 500 mg     500 mg 100 mL/hr over 60 Minutes Intravenous  Once 11/28/17 1839 11/28/17 2308      Assessment/Plan:  Diverticulitis with microperforation responding to medical therapy. Will advance  his diet and switch him to p.o. Antibiotics. If he continues to improve we will discharge him home tomorrow  Caroleen Hamman, MD, [facility]  11/30/2017

## 2017-12-01 ENCOUNTER — Other Ambulatory Visit: Payer: Self-pay | Admitting: Family Medicine

## 2017-12-01 DIAGNOSIS — F410 Panic disorder [episodic paroxysmal anxiety] without agoraphobia: Secondary | ICD-10-CM

## 2017-12-01 MED ORDER — METRONIDAZOLE 500 MG PO TABS: 500.0000 mg | ORAL_TABLET | Freq: Three times a day (TID) | ORAL | 0 refills | Status: AC

## 2017-12-01 MED ORDER — CIPROFLOXACIN HCL 500 MG PO TABS: 500.0000 mg | ORAL_TABLET | Freq: Two times a day (BID) | ORAL | 0 refills | Status: AC

## 2017-12-01 NOTE — Progress Notes (Signed)
HPI: The patient is a 57 yo male who was admitted with diverticulitis with microperforation. He has responded well to antibiotics and improved significantly since his presentation.  SUBJECTIVE: The patient states that he feels greatly improved today with minimal abdominal discomfort. He is tolerating a soft diet, passing flatus and soft stool, urinating well, and ambulating unassisted. He denies fever, chills, nausea, vomiting, chest pain, and SOB.  OBJECTIVE Vitals:   11/30/17 1959 12/01/17 0512  BP: (!) 141/95 (!) 149/94  Pulse: 89 69  Resp: 20 19  Temp: 97.9 F (36.6 C) 97.6 F (36.4 C)  SpO2: 97% 98%   Physical Exam  Constitutional: He is oriented to person, place, and time. He appears well-developed and well-nourished.  HENT:  Head: Normocephalic and atraumatic.  Cardiovascular: Normal rate and regular rhythm.  Respiratory: Effort normal.  GI: Soft. Bowel sounds are normal. He exhibits no distension and no mass. There is no tenderness. There is no rebound and no guarding.  Musculoskeletal: Normal range of motion.  Neurological: He is alert and oriented to person, place, and time.  Skin: Skin is warm and dry.  Psychiatric: He has a normal mood and affect.   ASSESSMENT/PLAN: The patient is a 57 yo male admitted with a second episode of diverticulitis with microperforation. He has experienced marked improvement in symptoms with antibiotic therapy. We will discharge the patient home today on oral antibiotics with follow-up in the surgery clinic in 1-2 weeks.

## 2017-12-01 NOTE — Discharge Summary (Signed)
DATE OF ADMISSION: 11/28/2017 DATE OF DISCHARGE: 12/01/2017 ADMITTING PHYSICIAN: Dr. Tama High, [facility]  ADMITTING DIAGNOSIS: Diverticulitis with microperforation DISCHARGE DIAGNOSIS: Diverticulitis with microperforation CONDITION ON DISCHARGE: Good DISPOSITION: The patient is discharged home  PROCEDURES: [place]: The patient is a 57 yo male who was admitted with a second episode of diverticulitis with microperforation. He was placed on IV cipro/flagyl and showed marked improvement in clinical status and laboratory evaluation. At the time of discharge he is tolerating a soft diet, passing flatus and soft stool, urinating well, and ambulating unassisted.   FOLLOW-UP: The patient is advised to follow-up in the surgery clinic in 1-2 weeks. Elective sigmoidectomy was discussed with the patient and will be discussed further in the outpatient setting.  DISCHARGE INSTRUCTIONS: The patient is advised to continue a low fiber diet for the next six weeks followed by a high fiber diet. He is instructed to call the office or return to the ED if he experiences worsening abdominal pain, fever/chills, nausea/vomiting, chest pain or shortness of breath.  MEDICATIONS AT DISCHARGE:  albuterol (PROVENTIL HFA;VENTOLIN HFA) 108 (90 Base) MCG/ACT inhaler Inhale 2 puffs into the lungs every 6 (six) hours as needed for wheezing or shortness of breath.  Resume at Discharge    amLODipine (NORVASC) 2.5 MG tablet Take 1 tablet (2.5 mg total) by mouth daily.  Resume at Discharge   fluticasone (FLONASE) 50 MCG/ACT nasal spray Place 2 sprays into both nostrils daily as needed for allergies or rhinitis.  Resume at Discharge   fluticasone furoate-vilanterol (BREO ELLIPTA) 200-25 MCG/INH AEPB Inhale 1 puff into the lungs daily.  Resume at Discharge   Loratadine 10 MG CAPS Take 1 tablet by mouth daily as needed (allergies).   Resume at Discharge   venlafaxine XR (EFFEXOR-XR) 150 MG 24 hr  capsule TAKE 1 CAPSULE BY MOUTH EVERY DAY  Resume at Discharge    ciprofloxacin (CIPRO) 500 MG tablet Take 1 tablet (500 mg total) by mouth 2 (two) times daily for 7 days.  Prescribed   metroNIDAZOLE (FLAGYL) 500 MG Take 1 tablet (500 mg total) by mouth 3 (three) times daily for 7 days.  Prescribed

## 2017-12-01 NOTE — Care Management (Signed)
Independent in all adls, denies issues accessing medical care, obtaining medications or with transportation.   No discharge needs identified

## 2017-12-01 NOTE — Progress Notes (Signed)
Gerald Boyer to be D/C'd Home per MD order.  Discussed prescriptions and follow up appointments with the patient. Prescriptions given to patient, medication list explained in detail. Pt verbalized understanding.  Allergies as of 12/01/2017   No Known Allergies     Medication List    TAKE these medications   albuterol 108 (90 Base) MCG/ACT inhaler Commonly known as:  PROVENTIL HFA;VENTOLIN HFA Inhale 2 puffs into the lungs every 6 (six) hours as needed for wheezing or shortness of breath.   amLODipine 2.5 MG tablet Commonly known as:  NORVASC Take 1 tablet (2.5 mg total) by mouth daily.   ciprofloxacin 500 MG tablet Commonly known as:  CIPRO Take 1 tablet (500 mg total) by mouth 2 (two) times daily for 7 days.   clonazePAM 0.5 MG tablet Commonly known as:  KLONOPIN TAKE 1 TABLET BY MOUTH 3 TIMES A DAY AS NEEDED FOR ANXIETY What changed:  See the new instructions.   fluticasone 50 MCG/ACT nasal spray Commonly known as:  FLONASE Place 2 sprays into both nostrils daily as needed for allergies or rhinitis.   fluticasone furoate-vilanterol 200-25 MCG/INH Aepb Commonly known as:  BREO ELLIPTA Inhale 1 puff into the lungs daily.   Loratadine 10 MG Caps Take 1 tablet by mouth daily as needed (allergies).   metroNIDAZOLE 500 MG tablet Commonly known as:  FLAGYL Take 1 tablet (500 mg total) by mouth every 8 (eight) hours for 7 days.   venlafaxine XR 150 MG 24 hr capsule Commonly known as:  EFFEXOR-XR TAKE 1 CAPSULE BY MOUTH EVERY DAY       Vitals:   11/30/17 1959 12/01/17 0512  BP: (!) 141/95 (!) 149/94  Pulse: 89 69  Resp: 20 19  Temp: 97.9 F (36.6 C) 97.6 F (36.4 C)  SpO2: 97% 98%    Skin clean, dry and intact without evidence of skin break down, no evidence of skin tears noted. IV catheter discontinued intact. Site without signs and symptoms of complications. Dressing and pressure applied. Pt denies pain at this time. No complaints noted.  An After Visit  Summary was printed and given to the patient. Patient escorted via family, and D/C home via private auto.  Sharalyn Ink

## 2017-12-05 DIAGNOSIS — C61 Malignant neoplasm of prostate: Secondary | ICD-10-CM | POA: Diagnosis not present

## 2017-12-05 DIAGNOSIS — M62838 Other muscle spasm: Secondary | ICD-10-CM | POA: Diagnosis not present

## 2017-12-05 DIAGNOSIS — M6281 Muscle weakness (generalized): Secondary | ICD-10-CM | POA: Diagnosis not present

## 2017-12-06 ENCOUNTER — Telehealth: Payer: Self-pay | Admitting: Pulmonary Disease

## 2017-12-06 NOTE — Telephone Encounter (Signed)
3 attempts to schedule fu appt from recall list.   Deleting recall.   

## 2017-12-08 ENCOUNTER — Encounter: Payer: Self-pay | Admitting: Surgery

## 2017-12-08 ENCOUNTER — Telehealth: Payer: Self-pay | Admitting: Licensed Clinical Social Worker

## 2017-12-08 ENCOUNTER — Ambulatory Visit (INDEPENDENT_AMBULATORY_CARE_PROVIDER_SITE_OTHER): Payer: 59 | Admitting: Surgery

## 2017-12-08 VITALS — BP 177/116 | HR 94 | Temp 98.0°F | Ht 67.0 in | Wt 210.4 lb

## 2017-12-08 DIAGNOSIS — K5732 Diverticulitis of large intestine without perforation or abscess without bleeding: Secondary | ICD-10-CM

## 2017-12-08 NOTE — Patient Instructions (Addendum)
Please follow up with [place] after your scheduled surgery.    High-Fiber Diet Fiber, also called dietary fiber, is a type of carbohydrate found in fruits, vegetables, whole grains, and beans. A high-fiber diet can have many health benefits. Your health care provider may recommend a high-fiber diet to help:  Prevent constipation. Fiber can make your bowel movements more regular.  Lower your cholesterol.  Relieve hemorrhoids, uncomplicated diverticulosis, or irritable bowel syndrome.  Prevent overeating as part of a weight-loss plan.  Prevent heart disease, type 2 diabetes, and certain cancers.  What is my plan? The recommended daily intake of fiber includes:  38 grams for men under age 104.  72 grams for men over age 65.  84 grams for women under age 54.  93 grams for women over age 85.  You can get the recommended daily intake of dietary fiber by eating a variety of fruits, vegetables, grains, and beans. Your health care provider may also recommend a fiber supplement if it is not possible to get enough fiber through your diet. What do I need to know about a high-fiber diet?  Fiber supplements have not been widely studied for their effectiveness, so it is better to get fiber through food sources.  Always check the fiber content on thenutrition facts label of any prepackaged food. Look for foods that contain at least 5 grams of fiber per serving.  Ask your dietitian if you have questions about specific foods that are related to your condition, especially if those foods are not listed in the following section.  Increase your daily fiber consumption gradually. Increasing your intake of dietary fiber too quickly may cause bloating, cramping, or gas.  Drink plenty of water. Water helps you to digest fiber. What foods can I eat? Grains Whole-grain breads. Multigrain cereal. Oats and oatmeal. Brown rice. Barley. Bulgur wheat. [place]. Bran muffins. Popcorn. Rye wafer  crackers. Vegetables Sweet potatoes. Spinach. Kale. Artichokes. Cabbage. Broccoli. Green peas. Carrots. Squash. Fruits Berries. Pears. Apples. Oranges. Avocados. Prunes and raisins. Dried figs. Meats and Other Protein Sources [place], kidney, pinto, and soy beans. Split peas. Lentils. Nuts and seeds. Dairy Fiber-fortified yogurt. Beverages Fiber-fortified soy milk. Fiber-fortified orange juice. Other Fiber bars. The items listed above may not be a complete list of recommended foods or beverages. Contact your dietitian for more options. What foods are not recommended? Grains White bread. Pasta made with refined flour. White rice. Vegetables Fried potatoes. Canned vegetables. Well-cooked vegetables. Fruits Fruit juice. Cooked, strained fruit. Meats and Other Protein Sources Fatty cuts of meat. Fried Sales executive or fried fish. Dairy Milk. Yogurt. Cream cheese. Sour cream. Beverages Soft drinks. Other Cakes and pastries. Butter and oils. The items listed above may not be a complete list of foods and beverages to avoid. Contact your dietitian for more information. What are some tips for including high-fiber foods in my diet?  Eat a wide variety of high-fiber foods.  Make sure that half of all grains consumed each day are whole grains.  Replace breads and cereals made from refined flour or white flour with whole-grain breads and cereals.  Replace white rice with brown rice, bulgur wheat, or millet.  Start the day with a breakfast that is high in fiber, such as a cereal that contains at least 5 grams of fiber per serving.  Use beans in place of meat in soups, salads, or pasta.  Eat high-fiber snacks, such as berries, raw vegetables, nuts, or popcorn. This information is not intended to replace advice given  to you by your health care provider. Make sure you discuss any questions you have with your health care provider. Document Released: 08/23/2005 Document Revised: 01/29/2016 Document  Reviewed: 02/05/2014 Elsevier Interactive Patient Education  [employer].

## 2017-12-08 NOTE — Progress Notes (Signed)
Surgical Clinic Progress/Follow-up Note   HPI:  57 y.o. Male presents to clinic for follow-up evaluation s/p recent hospital admission with antibiotics for 2nd episode in 3 years of acute sigmoid colonic diverticulitis. Patient reports he is again feeling better, denies significant abdominal pain with tolerating diet and soft BM's WNL, though adds that the Flagyl causes both a metallic taste and GI upset. He also repeats that he did not experience constipation prior to either episode of sigmoid colonic diverticulitis, underwent normal colonoscopy just 2 years ago, and would like to plan for surgery after his surgery the end of this month for prostate cancer. Patient otherwise denies N/V, fever/chills, CP, or SOB.  Doesn't like flagyl  Review of Systems:  Constitutional: denies any other weight loss, fever, chills, or sweats  Eyes: denies any other vision changes, history of eye injury  ENT: denies sore throat, hearing problems  Respiratory: denies shortness of breath, wheezing  Cardiovascular: denies chest pain, palpitations  Gastrointestinal: abdominal pain, N/V, and bowel function as per HPI Musculoskeletal: denies any other joint pains or cramps  Skin: Denies any other rashes or skin discolorations  Neurological: denies any other headache, dizziness, weakness  Psychiatric: denies any other depression, anxiety  All other review of systems: otherwise negative   Vital Signs:  BP (!) 177/116   Pulse 94   Temp 98 F (36.7 C) (Oral)   Ht 5\' 7"  (1.702 m)   Wt 210 lb 6.4 oz (95.4 kg)   BMI 32.95 kg/m    Physical Exam:  Constitutional:  -- Overweight body habitus  -- Awake, alert, and oriented x3  Eyes:  -- Pupils equally round and reactive to light  -- No scleral icterus  Ear, nose, throat:  -- No jugular venous distension  -- No nasal drainage, bleeding Pulmonary:  -- No crackles -- Equal breath sounds bilaterally -- Breathing non-labored at rest Cardiovascular:  -- S1,  S2 present  -- No pericardial rubs  Gastrointestinal:  -- Soft, nontender, non-distended, no guarding/rebound  -- No abdominal masses appreciated, pulsatile or otherwise  Musculoskeletal / Integumentary:  -- Wounds or skin discoloration: None appreciated -- Extremities: B/L UE and LE FROM, hands and feet warm, no edema  Neurologic:  -- Motor function: intact and symmetric  -- Sensation: intact and symmetric    Assessment:  57 y.o. yo Male with a problem list including...  Patient Active Problem List   Diagnosis Date Noted  . Diverticulitis of large intestine with perforation without bleeding 11/28/2017  . Obesity 08/23/2017  . Elevated prostate specific antigen (PSA) 08/04/2015  . Essential (primary) hypertension 08/04/2015  . Ruptured tympanic membrane 08/04/2015  . 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 Hypertriglyceridemia 06/27/2014  . History of colonic polyps 02/21/2012  . Panic disorder 09/06/2001  . Allergic rhinitis due to pollen 09/06/1998    presents to clinic for follow-up evaluation, doing well s/p recent hospital admission and antibiotics for recurrent sigmoid colonic diverticulitis without history of constipation, 2 years s/p colonoscopy.  Plan:   - complete prescribed course of antibiotics  - colonoscopy within past 2 years, does not require to be repeated so soon  - maintain hydration, easy-to-digest foods x 4 more weeks, then high-fiber foods  - all risks, benefits, and alternatives to laparoscopic and open partial colectomy were discussed with the patient, all of his questions were answered to his expressed satisfaction, patient expresses he wishes to proceed following his upcoming surgery for prostate cancer.  - return to clinic in May - June, following  recovery from surgery for prostate cancer  - instructed to call office if any questions or concerns  All of the above recommendations were discussed with the patient, and all of patient's questions were answered  to his expressed satisfaction.  -- Marilynne Drivers Rosana Hoes, MD, [place]: [facility] - Partnering for exceptional care. Office: [phone]

## 2017-12-08 NOTE — Telephone Encounter (Signed)
EMMI Response: CSW contacted patient regarding his responses to the EMMI call he received from the hospital. Patient allegedly had questions about his prescriptions and responded yes to losing interest in things. Patient informed CSW that he has been doing great and that he was able to get his prescriptions and did not have any questions. Patient also stated he must have answered yes in error to the loss of interest in things.  Shela Leff MSW,LCSW [phone]

## 2017-12-16 NOTE — Patient Instructions (Addendum)
Gerald Boyer  12/16/2017   Your procedure is scheduled on: 12-30-17   Report to [facility]  Entrance Report to Admitting at 5:30 AM    Call this number if you have problems the morning of surgery [phone]   Remember: Do not eat food or drink liquids :After Midnight.   Please consume a Clear Liquid Diet the Day of Prep  CLEAR LIQUID DIET   Foods Allowed                                                                     Foods Excluded  Coffee and tea, regular and decaf                             liquids that you cannot  Plain Jell-O in any flavor                                             see through such as: Fruit ices (not with fruit pulp)                                     milk, soups, orange juice  Iced Popsicles                                    All solid food Carbonated beverages, regular and diet                                    Cranberry, grape and apple juices Sports drinks like Gatorade Lightly seasoned clear broth or consume(fat free) Sugar, honey syrup  Sample Menu Breakfast                                Lunch                                     Supper Cranberry juice                    Beef broth                            Chicken broth Jell-O                                     Grape juice                           Apple juice Coffee or tea  Jell-O                                      Popsicle                                                Coffee or tea                        Coffee or tea  _____________________________________________________________________     Take these medicines the morning of surgery with A SIP OF WATER:  Amlodipine (Norvasc), Venlafaxine XR, and Clonazepam as needed. You may also bring and use your eyedrops, inhaler and nasal spray                                You may not have any metal on your body including hair pins and              piercings  Do not wear jewelry,  lotions, powders or deodorant             Men may shave face and neck.   Do not bring valuables to the hospital. [place].  Contacts, dentures or bridgework may not be worn into surgery.  Leave suitcase in the car. After surgery it may be brought to your room.                Please read over the following fact sheets you were given: _____________________________________________________________________          [facility] - Preparing for Surgery Before surgery, you can play an important role.  Because skin is not sterile, your skin needs to be as free of germs as possible.  You can reduce the number of germs on your skin by washing with CHG (chlorahexidine gluconate) soap before surgery.  CHG is an antiseptic cleaner which kills germs and bonds with the skin to continue killing germs even after washing. Please DO NOT use if you have an allergy to CHG or antibacterial soaps.  If your skin becomes reddened/irritated stop using the CHG and inform your nurse when you arrive at Short Stay. Do not shave (including legs and underarms) for at least 48 hours prior to the first CHG shower.  You may shave your face/neck. Please follow these instructions carefully:  1.  Shower with CHG Soap the night before surgery and the  morning of Surgery.  2.  If you choose to wash your hair, wash your hair first as usual with your  normal  shampoo.  3.  After you shampoo, rinse your hair and body thoroughly to remove the  shampoo.                           4.  Use CHG as you would any other liquid soap.  You can apply chg directly  to the skin and wash                       Gently with a scrungie or clean washcloth.  5.  Apply the CHG Soap to your body ONLY FROM THE NECK DOWN.   Do not use on face/ open                           Wound or open sores. Avoid contact with eyes, ears mouth and genitals (private parts).                       Wash face,  Genitals (private  parts) with your normal soap.             6.  Wash thoroughly, paying special attention to the area where your surgery  will be performed.  7.  Thoroughly rinse your body with warm water from the neck down.  8.  DO NOT shower/wash with your normal soap after using and rinsing off  the CHG Soap.                9.  Pat yourself dry with a clean towel.            10.  Wear clean pajamas.            11.  Place clean sheets on your bed the night of your first shower and do not  sleep with pets. Day of Surgery : Do not apply any lotions/deodorants the morning of surgery.  Please wear clean clothes to the hospital/surgery center.  FAILURE TO FOLLOW THESE INSTRUCTIONS MAY RESULT IN THE CANCELLATION OF YOUR SURGERY PATIENT SIGNATURE_________________________________  NURSE SIGNATURE__________________________________  ________________________________________________________________________  WHAT IS A BLOOD TRANSFUSION? Blood Transfusion Information  A transfusion is the replacement of blood or some of its parts. Blood is made up of multiple cells which provide different functions.  Red blood cells carry oxygen and are used for blood loss replacement.  White blood cells fight against infection.  Platelets control bleeding.  Plasma helps clot blood.  Other blood products are available for specialized needs, such as hemophilia or other clotting disorders. BEFORE THE TRANSFUSION  Who gives blood for transfusions?   Healthy volunteers who are fully evaluated to make sure their blood is safe. This is blood bank blood. Transfusion therapy is the safest it has ever been in the practice of medicine. Before blood is taken from a donor, a complete history is taken to make sure that person has no history of diseases nor engages in risky social behavior (examples are intravenous drug use or sexual activity with multiple partners). The donor's travel history is screened to minimize risk of transmitting  infections, such as malaria. The donated blood is tested for signs of infectious diseases, such as HIV and hepatitis. The blood is then tested to be sure it is compatible with you in order to minimize the chance of a transfusion reaction. If you or a relative donates blood, this is often done in anticipation of surgery and is not appropriate for emergency situations. It takes many days to process the donated blood. RISKS AND COMPLICATIONS Although transfusion therapy is very safe and saves many lives, the main dangers of transfusion include:   Getting an infectious disease.  Developing a transfusion reaction. This is an allergic reaction to something in the blood you were given. Every precaution is taken to prevent this. The decision to have a blood transfusion has been considered carefully by your caregiver before blood is given. Blood is not given unless the benefits outweigh the risks. AFTER THE TRANSFUSION  Right  after receiving a blood transfusion, you will usually feel much better and more energetic. This is especially true if your red blood cells have gotten low (anemic). The transfusion raises the level of the red blood cells which carry oxygen, and this usually causes an energy increase.  The nurse administering the transfusion will monitor you carefully for complications. HOME CARE INSTRUCTIONS  No special instructions are needed after a transfusion. You may find your energy is better. Speak with your caregiver about any limitations on activity for underlying diseases you may have. SEEK MEDICAL CARE IF:   Your condition is not improving after your transfusion.  You develop redness or irritation at the intravenous (IV) site. SEEK IMMEDIATE MEDICAL CARE IF:  Any of the following symptoms occur over the next 12 hours:  Shaking chills.  You have a temperature by mouth above 102 F (38.9 C), not controlled by medicine.  Chest, back, or muscle pain.  People around you feel you are  not acting correctly or are confused.  Shortness of breath or difficulty breathing.  Dizziness and fainting.  You get a rash or develop hives.  You have a decrease in urine output.  Your urine turns a dark color or changes to pink, red, or brown. Any of the following symptoms occur over the next 10 days:  You have a temperature by mouth above 102 F (38.9 C), not controlled by medicine.  Shortness of breath.  Weakness after normal activity.  The white part of the eye turns yellow (jaundice).  You have a decrease in the amount of urine or are urinating less often.  Your urine turns a dark color or changes to pink, red, or brown. Document Released: 08/20/2000 Document Revised: 11/15/2011 Document Reviewed: 04/08/2008 [facility] Patient Information 2014 [place], [place].  _______________________________________________________________________

## 2017-12-19 ENCOUNTER — Encounter (HOSPITAL_COMMUNITY)
Admission: RE | Admit: 2017-12-19 | Discharge: 2017-12-19 | Disposition: A | Discharge status: Home / Self Care | Payer: 59 | Source: Ambulatory Visit | Attending: Urology | Admitting: Urology

## 2017-12-19 ENCOUNTER — Other Ambulatory Visit: Payer: Self-pay

## 2017-12-19 ENCOUNTER — Encounter (HOSPITAL_COMMUNITY): Payer: Self-pay

## 2017-12-19 DIAGNOSIS — Z0183 Encounter for blood typing: Secondary | ICD-10-CM | POA: Diagnosis not present

## 2017-12-19 DIAGNOSIS — C61 Malignant neoplasm of prostate: Secondary | ICD-10-CM | POA: Diagnosis not present

## 2017-12-19 DIAGNOSIS — I10 Essential (primary) hypertension: Secondary | ICD-10-CM | POA: Diagnosis not present

## 2017-12-19 DIAGNOSIS — Z01818 Encounter for other preprocedural examination: Secondary | ICD-10-CM | POA: Insufficient documentation

## 2017-12-19 DIAGNOSIS — Z01812 Encounter for preprocedural laboratory examination: Secondary | ICD-10-CM | POA: Diagnosis not present

## 2017-12-19 DIAGNOSIS — M62838 Other muscle spasm: Secondary | ICD-10-CM | POA: Diagnosis not present

## 2017-12-19 DIAGNOSIS — M6281 Muscle weakness (generalized): Secondary | ICD-10-CM | POA: Diagnosis not present

## 2017-12-19 LAB — BASIC METABOLIC PANEL
ANION GAP: 12 (ref 5–15)
BUN: 16 mg/dL (ref 6–20)
CHLORIDE: 103 mmol/L (ref 101–111)
CO2: 22 mmol/L (ref 22–32)
Calcium: 8.9 mg/dL (ref 8.9–10.3)
Creatinine, Ser: 1.06 mg/dL (ref 0.61–1.24)
GFR calc Af Amer: >60 mL/min (ref >60)
Glucose, Bld: 98 mg/dL (ref 65–99)
POTASSIUM: 4.5 mmol/L (ref 3.5–5.1)
Sodium: 137 mmol/L (ref 135–145)

## 2017-12-19 LAB — TYPE AND SCREEN
ABO/RH(D): A POS
ANTIBODY SCREEN: NEGATIVE

## 2017-12-19 LAB — CBC
HEMATOCRIT: 48.2 % (ref 39.0–52.0)
HEMOGLOBIN: 17.1 g/dL — AB (ref 13.0–17.0)
MCH: 34.3 pg — ABNORMAL HIGH (ref 26.0–34.0)
MCHC: 35.5 g/dL (ref 30.0–36.0)
MCV: 96.8 fL (ref 78.0–100.0)
Platelets: 317 10*3/uL (ref 150–400)
RBC: 4.98 MIL/uL (ref 4.22–5.81)
RDW: 12.6 % (ref 11.5–15.5)
WBC: 7.8 10*3/uL (ref 4.0–10.5)

## 2017-12-19 LAB — ABO/RH: ABO/RH(D): A POS

## 2017-12-20 DIAGNOSIS — C61 Malignant neoplasm of prostate: Secondary | ICD-10-CM | POA: Diagnosis not present

## 2017-12-29 NOTE — Anesthesia Preprocedure Evaluation (Addendum)
Anesthesia Evaluation  Patient identified by MRN, date of birth, ID band Patient awake    Reviewed: Allergy & Precautions, NPO status , Patient's Chart, lab work & pertinent test results  Airway Mallampati: III  TM Distance: >3 FB Neck ROM: Full    Dental  (+) Teeth Intact, Dental Advisory Given   Pulmonary asthma ,    Pulmonary exam normal breath sounds clear to auscultation       Cardiovascular Exercise Tolerance: Good hypertension, Pt. on medications Normal cardiovascular exam Rhythm:Regular Rate:Normal     Neuro/Psych PSYCHIATRIC DISORDERS Anxiety negative neurological ROS     GI/Hepatic negative GI ROS, Neg liver ROS,   Endo/Other  negative endocrine ROSObesity   Renal/GU negative Renal ROS   Prostate cancer    Musculoskeletal negative musculoskeletal ROS (+)   Abdominal   Peds  Hematology negative hematology ROS (+)   Anesthesia Other Findings Day of surgery medications reviewed with the patient.  Reproductive/Obstetrics                            Anesthesia Physical Anesthesia Plan  ASA: III  Anesthesia Plan: General   Post-op Pain Management:    Induction: Intravenous  PONV Risk Score and Plan: 3 and Dexamethasone, Ondansetron, Midazolam and Scopolamine patch - Pre-op  Airway Management Planned: Oral ETT  Additional Equipment:   Intra-op Plan:   Post-operative Plan: Extubation in OR  Informed Consent: I have reviewed the patients History and Physical, chart, labs and discussed the procedure including the risks, benefits and alternatives for the proposed anesthesia with the patient or authorized representative who has indicated his/her understanding and acceptance.   Dental advisory given  Plan Discussed with: CRNA  Anesthesia Plan Comments: (2nd IV after induction.)        Anesthesia Quick Evaluation

## 2017-12-30 ENCOUNTER — Encounter (HOSPITAL_COMMUNITY)
Admission: RE | Disposition: A | Discharge status: Home / Self Care | Payer: Self-pay | Source: Ambulatory Visit | Attending: Urology

## 2017-12-30 ENCOUNTER — Ambulatory Visit (HOSPITAL_COMMUNITY): Payer: 59 | Admitting: Anesthesiology

## 2017-12-30 ENCOUNTER — Observation Stay (HOSPITAL_COMMUNITY)
Admission: RE | Admit: 2017-12-30 | Discharge: 2017-12-31 | Disposition: A | Discharge status: Home / Self Care | Payer: 59 | Source: Ambulatory Visit | Attending: Urology | Admitting: Urology

## 2017-12-30 ENCOUNTER — Encounter (HOSPITAL_COMMUNITY): Payer: Self-pay | Admitting: *Deleted

## 2017-12-30 ENCOUNTER — Other Ambulatory Visit: Payer: Self-pay

## 2017-12-30 DIAGNOSIS — F419 Anxiety disorder, unspecified: Secondary | ICD-10-CM | POA: Insufficient documentation

## 2017-12-30 DIAGNOSIS — C61 Malignant neoplasm of prostate: Secondary | ICD-10-CM | POA: Diagnosis not present

## 2017-12-30 DIAGNOSIS — J45909 Unspecified asthma, uncomplicated: Secondary | ICD-10-CM | POA: Diagnosis not present

## 2017-12-30 DIAGNOSIS — Z79899 Other long term (current) drug therapy: Secondary | ICD-10-CM | POA: Diagnosis not present

## 2017-12-30 DIAGNOSIS — I10 Essential (primary) hypertension: Secondary | ICD-10-CM | POA: Diagnosis not present

## 2017-12-30 DIAGNOSIS — E781 Pure hyperglyceridemia: Secondary | ICD-10-CM | POA: Diagnosis not present

## 2017-12-30 DIAGNOSIS — Z7951 Long term (current) use of inhaled steroids: Secondary | ICD-10-CM | POA: Diagnosis not present

## 2017-12-30 HISTORY — PX: LYMPHADENECTOMY: SHX5960

## 2017-12-30 HISTORY — PX: ROBOT ASSISTED LAPAROSCOPIC RADICAL PROSTATECTOMY: SHX5141

## 2017-12-30 LAB — HEMOGLOBIN AND HEMATOCRIT, BLOOD
HCT: 46.5 % (ref 39.0–52.0)
HEMOGLOBIN: 16.1 g/dL (ref 13.0–17.0)

## 2017-12-30 SURGERY — PROSTATECTOMY, RADICAL, ROBOT-ASSISTED, LAPAROSCOPIC
Anesthesia: General

## 2017-12-30 MED ORDER — PROPOFOL 10 MG/ML IV BOLUS
INTRAVENOUS | Status: AC
  Filled 2017-12-30: qty 20

## 2017-12-30 MED ORDER — MAGNESIUM CITRATE PO SOLN
1.0000 | Freq: Once | ORAL | Status: DC
  Filled 2017-12-30: qty 296

## 2017-12-30 MED ORDER — ROCURONIUM BROMIDE 10 MG/ML (PF) SYRINGE
PREFILLED_SYRINGE | INTRAVENOUS | Status: AC
  Filled 2017-12-30: qty 5

## 2017-12-30 MED ORDER — ONDANSETRON HCL 4 MG/2ML IJ SOLN
INTRAMUSCULAR | Status: DC | PRN
  Administered 2017-12-30: 4 mg via INTRAVENOUS

## 2017-12-30 MED ORDER — SODIUM CHLORIDE 0.9 % IJ SOLN
INTRAMUSCULAR | Status: DC | PRN
  Administered 2017-12-30: 20 mL

## 2017-12-30 MED ORDER — AMLODIPINE BESYLATE 5 MG PO TABS
2.5000 mg | ORAL_TABLET | Freq: Every day | ORAL | Status: DC
  Administered 2017-12-31: 2.5 mg via ORAL
  Filled 2017-12-30: qty 1

## 2017-12-30 MED ORDER — VENLAFAXINE HCL ER 150 MG PO CP24
150.0000 mg | ORAL_CAPSULE | Freq: Every day | ORAL | Status: DC
  Administered 2017-12-31: 150 mg via ORAL
  Filled 2017-12-30: qty 1

## 2017-12-30 MED ORDER — FENTANYL CITRATE (PF) 250 MCG/5ML IJ SOLN
INTRAMUSCULAR | Status: AC
  Filled 2017-12-30: qty 5

## 2017-12-30 MED ORDER — INDOCYANINE GREEN 25 MG IV SOLR
INTRAVENOUS | Status: DC | PRN
  Administered 2017-12-30: 1 mg

## 2017-12-30 MED ORDER — ACETAMINOPHEN 500 MG PO TABS
1000.0000 mg | ORAL_TABLET | Freq: Four times a day (QID) | ORAL | Status: DC
  Administered 2017-12-30 (×3): 1000 mg via ORAL
  Filled 2017-12-30 (×4): qty 2

## 2017-12-30 MED ORDER — SCOPOLAMINE 1 MG/3DAYS TD PT72
MEDICATED_PATCH | TRANSDERMAL | Status: AC
  Filled 2017-12-30: qty 1

## 2017-12-30 MED ORDER — ROCURONIUM BROMIDE 50 MG/5ML IV SOSY
PREFILLED_SYRINGE | INTRAVENOUS | Status: DC | PRN
  Administered 2017-12-30: 50 mg via INTRAVENOUS
  Administered 2017-12-30: 20 mg via INTRAVENOUS
  Administered 2017-12-30: 10 mg via INTRAVENOUS
  Administered 2017-12-30 (×2): 20 mg via INTRAVENOUS

## 2017-12-30 MED ORDER — FLUTICASONE FUROATE-VILANTEROL 200-25 MCG/INH IN AEPB
1.0000 | INHALATION_SPRAY | Freq: Every day | RESPIRATORY_TRACT | Status: DC
  Filled 2017-12-30: qty 28

## 2017-12-30 MED ORDER — OXYCODONE HCL 5 MG PO TABS: 5.0000 mg | ORAL_TABLET | ORAL | Status: DC | PRN

## 2017-12-30 MED ORDER — SODIUM CHLORIDE 0.9 % IV BOLUS
1000.0000 mL | Freq: Once | INTRAVENOUS | Status: AC
  Administered 2017-12-30: 1000 mL via INTRAVENOUS

## 2017-12-30 MED ORDER — GABAPENTIN 300 MG PO CAPS
300.0000 mg | ORAL_CAPSULE | Freq: Once | ORAL | Status: AC
  Administered 2017-12-30: 300 mg via ORAL
  Filled 2017-12-30: qty 1

## 2017-12-30 MED ORDER — EPHEDRINE 5 MG/ML INJ
INTRAVENOUS | Status: AC
  Filled 2017-12-30: qty 10

## 2017-12-30 MED ORDER — ONDANSETRON HCL 4 MG/2ML IJ SOLN
INTRAMUSCULAR | Status: AC
  Filled 2017-12-30: qty 2

## 2017-12-30 MED ORDER — MIDAZOLAM HCL 2 MG/2ML IJ SOLN
INTRAMUSCULAR | Status: AC
  Filled 2017-12-30: qty 2

## 2017-12-30 MED ORDER — ALBUTEROL SULFATE HFA 108 (90 BASE) MCG/ACT IN AERS: 2.0000 | INHALATION_SPRAY | Freq: Four times a day (QID) | RESPIRATORY_TRACT | Status: DC | PRN

## 2017-12-30 MED ORDER — MIDAZOLAM HCL 5 MG/5ML IJ SOLN
INTRAMUSCULAR | Status: DC | PRN
  Administered 2017-12-30: 2 mg via INTRAVENOUS

## 2017-12-30 MED ORDER — PROPOFOL 10 MG/ML IV BOLUS
INTRAVENOUS | Status: DC | PRN
  Administered 2017-12-30: 180 mg via INTRAVENOUS

## 2017-12-30 MED ORDER — ALBUTEROL SULFATE (2.5 MG/3ML) 0.083% IN NEBU: 2.5000 mg | INHALATION_SOLUTION | Freq: Four times a day (QID) | RESPIRATORY_TRACT | Status: DC | PRN

## 2017-12-30 MED ORDER — LACTATED RINGERS IV SOLN
INTRAVENOUS | Status: DC | PRN
  Administered 2017-12-30: 08:00:00 via INTRAVENOUS

## 2017-12-30 MED ORDER — SUGAMMADEX SODIUM 200 MG/2ML IV SOLN
INTRAVENOUS | Status: DC | PRN
  Administered 2017-12-30: 200 mg via INTRAVENOUS

## 2017-12-30 MED ORDER — BUPIVACAINE LIPOSOME 1.3 % IJ SUSP
20.0000 mL | Freq: Once | INTRAMUSCULAR | Status: AC
  Administered 2017-12-30: 20 mL
  Filled 2017-12-30: qty 20

## 2017-12-30 MED ORDER — SODIUM CHLORIDE 0.9 % IJ SOLN
INTRAMUSCULAR | Status: AC
  Filled 2017-12-30: qty 50

## 2017-12-30 MED ORDER — ACETAMINOPHEN 500 MG PO TABS
1000.0000 mg | ORAL_TABLET | Freq: Once | ORAL | Status: AC
  Administered 2017-12-30: 1000 mg via ORAL
  Filled 2017-12-30: qty 2

## 2017-12-30 MED ORDER — DEXTROSE-NACL 5-0.45 % IV SOLN
INTRAVENOUS | Status: DC
  Administered 2017-12-30: 12:00:00 via INTRAVENOUS

## 2017-12-30 MED ORDER — PROMETHAZINE HCL 25 MG/ML IJ SOLN: 6.2500 mg | INTRAMUSCULAR | Status: DC | PRN

## 2017-12-30 MED ORDER — SULFAMETHOXAZOLE-TRIMETHOPRIM 800-160 MG PO TABS: 1.0000 | ORAL_TABLET | Freq: Two times a day (BID) | ORAL | 0 refills | Status: DC

## 2017-12-30 MED ORDER — FENTANYL CITRATE (PF) 100 MCG/2ML IJ SOLN: 25.0000 ug | INTRAMUSCULAR | Status: DC | PRN

## 2017-12-30 MED ORDER — HYDROMORPHONE HCL 1 MG/ML IJ SOLN: 0.5000 mg | INTRAMUSCULAR | Status: DC | PRN

## 2017-12-30 MED ORDER — ONDANSETRON HCL 4 MG/2ML IJ SOLN: 4.0000 mg | INTRAMUSCULAR | Status: DC | PRN

## 2017-12-30 MED ORDER — CEFAZOLIN SODIUM-DEXTROSE 2-4 GM/100ML-% IV SOLN
2.0000 g | INTRAVENOUS | Status: AC
  Administered 2017-12-30: 2 g via INTRAVENOUS
  Filled 2017-12-30: qty 100

## 2017-12-30 MED ORDER — HYDROCODONE-ACETAMINOPHEN 5-325 MG PO TABS: 1.0000 | ORAL_TABLET | Freq: Four times a day (QID) | ORAL | 0 refills | Status: DC | PRN

## 2017-12-30 MED ORDER — SCOPOLAMINE 1 MG/3DAYS TD PT72
MEDICATED_PATCH | TRANSDERMAL | Status: DC | PRN
  Administered 2017-12-30: 1 via TRANSDERMAL

## 2017-12-30 MED ORDER — LACTATED RINGERS IV SOLN
INTRAVENOUS | Status: DC
  Administered 2017-12-30: 06:00:00 via INTRAVENOUS

## 2017-12-30 MED ORDER — LIDOCAINE 2% (20 MG/ML) 5 ML SYRINGE
INTRAMUSCULAR | Status: AC
  Filled 2017-12-30: qty 5

## 2017-12-30 MED ORDER — LIDOCAINE 2% (20 MG/ML) 5 ML SYRINGE
INTRAMUSCULAR | Status: DC | PRN
  Administered 2017-12-30: 80 mg via INTRAVENOUS

## 2017-12-30 MED ORDER — EPHEDRINE SULFATE-NACL 50-0.9 MG/10ML-% IV SOSY
PREFILLED_SYRINGE | INTRAVENOUS | Status: DC | PRN
  Administered 2017-12-30: 5 mg via INTRAVENOUS

## 2017-12-30 MED ORDER — SUGAMMADEX SODIUM 500 MG/5ML IV SOLN
INTRAVENOUS | Status: AC
  Filled 2017-12-30: qty 5

## 2017-12-30 MED ORDER — DEXAMETHASONE SODIUM PHOSPHATE 10 MG/ML IJ SOLN
INTRAMUSCULAR | Status: AC
  Filled 2017-12-30: qty 1

## 2017-12-30 MED ORDER — DIPHENHYDRAMINE HCL 50 MG/ML IJ SOLN: 12.5000 mg | Freq: Four times a day (QID) | INTRAMUSCULAR | Status: DC | PRN

## 2017-12-30 MED ORDER — FENTANYL CITRATE (PF) 100 MCG/2ML IJ SOLN
INTRAMUSCULAR | Status: AC
  Filled 2017-12-30: qty 2

## 2017-12-30 MED ORDER — DEXAMETHASONE SODIUM PHOSPHATE 4 MG/ML IJ SOLN
INTRAMUSCULAR | Status: DC | PRN
  Administered 2017-12-30: 10 mg via INTRAVENOUS

## 2017-12-30 MED ORDER — DIPHENHYDRAMINE HCL 12.5 MG/5ML PO ELIX: 12.5000 mg | ORAL_SOLUTION | Freq: Four times a day (QID) | ORAL | Status: DC | PRN

## 2017-12-30 MED ORDER — CLONAZEPAM 0.5 MG PO TABS: 0.5000 mg | ORAL_TABLET | Freq: Three times a day (TID) | ORAL | Status: DC | PRN

## 2017-12-30 MED ORDER — FENTANYL CITRATE (PF) 100 MCG/2ML IJ SOLN
INTRAMUSCULAR | Status: DC | PRN
  Administered 2017-12-30 (×3): 50 ug via INTRAVENOUS
  Administered 2017-12-30 (×2): 25 ug via INTRAVENOUS
  Administered 2017-12-30: 50 ug via INTRAVENOUS
  Administered 2017-12-30 (×2): 25 ug via INTRAVENOUS

## 2017-12-30 SURGICAL SUPPLY — 73 items
APPLICATOR COTTON TIP 6IN STRL (MISCELLANEOUS) ×4 IMPLANT
BAG URO CATCHER STRL LF (MISCELLANEOUS) IMPLANT
CATH FOLEY 2WAY SLVR 18FR 30CC (CATHETERS) ×4 IMPLANT
CATH TIEMANN FOLEY 18FR 5CC (CATHETERS) ×4 IMPLANT
CHLORAPREP W/TINT 26ML (MISCELLANEOUS) ×4 IMPLANT
CLIP VESOLOCK LG 6/CT PURPLE (CLIP) ×12 IMPLANT
CLOTH BEACON ORANGE TIMEOUT ST (SAFETY) ×4 IMPLANT
CONT SPEC 4OZ CLIKSEAL STRL BL (MISCELLANEOUS) ×4 IMPLANT
COVER FOOTSWITCH UNIV (MISCELLANEOUS) IMPLANT
COVER SURGICAL LIGHT HANDLE (MISCELLANEOUS) ×4 IMPLANT
COVER TIP SHEARS 8 DVNC (MISCELLANEOUS) ×2 IMPLANT
COVER TIP SHEARS 8MM DA VINCI (MISCELLANEOUS) ×2
CUTTER ECHEON FLEX ENDO 45 340 (ENDOMECHANICALS) ×4 IMPLANT
DECANTER SPIKE VIAL GLASS SM (MISCELLANEOUS) ×4 IMPLANT
DERMABOND ADVANCED (GAUZE/BANDAGES/DRESSINGS) ×4
DERMABOND ADVANCED .7 DNX12 (GAUZE/BANDAGES/DRESSINGS) ×4 IMPLANT
DRAPE ARM DVNC X/XI (DISPOSABLE) ×8 IMPLANT
DRAPE COLUMN DVNC XI (DISPOSABLE) ×2 IMPLANT
DRAPE DA VINCI XI ARM (DISPOSABLE) ×8
DRAPE DA VINCI XI COLUMN (DISPOSABLE) ×2
DRAPE SURG IRRIG POUCH 19X23 (DRAPES) ×4 IMPLANT
DRSG TEGADERM 4X4.75 (GAUZE/BANDAGES/DRESSINGS) ×4 IMPLANT
ELECT PENCIL ROCKER SW 15FT (MISCELLANEOUS) ×4 IMPLANT
ELECT REM PT RETURN 15FT ADLT (MISCELLANEOUS) ×4 IMPLANT
GAUZE SPONGE 2X2 8PLY STRL LF (GAUZE/BANDAGES/DRESSINGS) IMPLANT
GLOVE BIO SURGEON STRL SZ 6.5 (GLOVE) ×3 IMPLANT
GLOVE BIO SURGEONS STRL SZ 6.5 (GLOVE) ×1
GLOVE BIOGEL M STRL SZ7.5 (GLOVE) ×8 IMPLANT
GLOVE BIOGEL PI IND STRL 7.5 (GLOVE) ×2 IMPLANT
GLOVE BIOGEL PI INDICATOR 7.5 (GLOVE) ×2
GLOVE SURG SS PI 7.5 STRL IVOR (GLOVE) ×4 IMPLANT
GOWN STRL REUS W/TWL LRG LVL3 (GOWN DISPOSABLE) ×12 IMPLANT
GOWN STRL REUS W/TWL XL LVL3 (GOWN DISPOSABLE) IMPLANT
HOLDER FOLEY CATH W/STRAP (MISCELLANEOUS) ×4 IMPLANT
IRRIG SUCT STRYKERFLOW 2 WTIP (MISCELLANEOUS) ×4
IRRIGATION SUCT STRKRFLW 2 WTP (MISCELLANEOUS) ×2 IMPLANT
IV LACTATED RINGERS 1000ML (IV SOLUTION) ×4 IMPLANT
KIT PROCEDURE DA VINCI SI (MISCELLANEOUS) ×2
KIT PROCEDURE DVNC SI (MISCELLANEOUS) ×2 IMPLANT
MANIFOLD NEPTUNE II (INSTRUMENTS) ×4 IMPLANT
NEEDLE INSUFFLATION 14GA 120MM (NEEDLE) ×4 IMPLANT
NEEDLE SPNL 22GX7 QUINCKE BK (NEEDLE) ×4 IMPLANT
PACK CYSTO (CUSTOM PROCEDURE TRAY) IMPLANT
PACK ROBOT UROLOGY CUSTOM (CUSTOM PROCEDURE TRAY) ×4 IMPLANT
PAD POSITIONING PINK XL (MISCELLANEOUS) ×4 IMPLANT
PORT ACCESS TROCAR AIRSEAL 12 (TROCAR) ×2 IMPLANT
PORT ACCESS TROCAR AIRSEAL 5M (TROCAR) ×2
SEAL CANN UNIV 5-8 DVNC XI (MISCELLANEOUS) ×8 IMPLANT
SEAL XI 5MM-8MM UNIVERSAL (MISCELLANEOUS) ×8
SET TRI-LUMEN FLTR TB AIRSEAL (TUBING) ×4 IMPLANT
SOLUTION ANTI FOG 6CC (MISCELLANEOUS) ×4 IMPLANT
SOLUTION ELECTROLUBE (MISCELLANEOUS) ×4 IMPLANT
SPONGE GAUZE 2X2 STER 10/PKG (GAUZE/BANDAGES/DRESSINGS)
SPONGE LAP 4X18 RFD (DISPOSABLE) ×4 IMPLANT
STAPLE RELOAD 45 GRN (STAPLE) ×2 IMPLANT
STAPLE RELOAD 45MM GREEN (STAPLE) ×2
SUT ETHILON 3 0 PS 1 (SUTURE) ×4 IMPLANT
SUT MNCRL AB 4-0 PS2 18 (SUTURE) ×12 IMPLANT
SUT PDS AB 1 CT1 27 (SUTURE) ×8 IMPLANT
SUT VIC AB 2-0 SH 27 (SUTURE) ×2
SUT VIC AB 2-0 SH 27X BRD (SUTURE) ×2 IMPLANT
SUT VICRYL 0 UR6 27IN ABS (SUTURE) ×4 IMPLANT
SUT VLOC BARB 180 ABS3/0GR12 (SUTURE) ×12
SUTURE VLOC BRB 180 ABS3/0GR12 (SUTURE) ×6 IMPLANT
SYR 27GX1/2 1ML LL SAFETY (SYRINGE) ×4 IMPLANT
SYR 3ML LL SCALE MARK (SYRINGE) ×4 IMPLANT
SYR CONTROL 10ML LL (SYRINGE) IMPLANT
TOWEL OR 17X26 10 PK STRL BLUE (TOWEL DISPOSABLE) IMPLANT
TOWEL OR NON WOVEN STRL DISP B (DISPOSABLE) ×4 IMPLANT
TUBING CONNECTING 10 (TUBING) IMPLANT
TUBING CONNECTING 10' (TUBING)
WATER STERILE IRR 1000ML POUR (IV SOLUTION) ×4 IMPLANT
WATER STERILE IRR 3000ML UROMA (IV SOLUTION) IMPLANT

## 2017-12-30 NOTE — Anesthesia Procedure Notes (Signed)
Procedure Name: Intubation Date/Time: 12/30/2017 7:36 AM Performed by: Deliah Boston, CRNA Pre-anesthesia Checklist: Patient identified, Emergency Drugs available, Suction available and Patient being monitored Patient Re-evaluated:Patient Re-evaluated prior to induction Oxygen Delivery Method: Circle system utilized Preoxygenation: Pre-oxygenation with 100% oxygen Induction Type: IV induction Ventilation: Mask ventilation without difficulty Laryngoscope Size: Mac and 4 Grade View: Grade I Tube type: Oral Tube size: 7.5 mm Number of attempts: 1 Airway Equipment and Method: Stylet and Oral airway Placement Confirmation: ETT inserted through vocal cords under direct vision,  positive ETCO2 and breath sounds checked- equal and bilateral Secured at: 21 cm Tube secured with: Tape Dental Injury: Teeth and Oropharynx as per pre-operative assessment

## 2017-12-30 NOTE — H&P (Signed)
Gerald Boyer is an 57 y.o. male.    Chief Complaint: Pre-op Robotic Prostatectomy.   HPI:   1 - High Risk Prostate Cancer - 4 cores up to Gleason 4+3=7 disease (RMA, LMA, RMM, RLA) on eval rising PSA to 70.4 by MRI fusion BX 10/2017. Had negative BX x 3 by Gerald Boyer previously. CT and Bone Scan 11/2017 clinically localized (nonspecific maxiallary uptake only, likley inflammatory). TRUS 63mL, no median lobe.   He is scheduled for prostatectomy 4/26 and has had pre-op PT eval.   PMH sig for HLD, HTN, Anxiety, TNA, recurrent diverticulitis. NO ischemic CV disease / blood thinners. His PCP is Gerald Beets MD with [facility].   Today " Gerald Boyer " is seen to proceed with prostatectomy. NO interval fevers.     Past Medical History:  Diagnosis Date  . Allergy   . Anxiety   . Diverticulitis   . Hyperlipidemia   . Hypertension     Past Surgical History:  Procedure Laterality Date  . LIPOMA EXCISION     located on  left shoulder  . PROSTATE BIOPSY  01/2011   outpatient, Dr. Eliberto Boyer; 05/2011- mild chronic inflammation, no dysplasia  . TONSILLECTOMY    . VASECTOMY  1995    Family History  Problem Relation Age of Onset  . COPD Mother   . Kidney cancer Mother   . Rectal cancer Daughter   . Hypertension Other    Social History:  reports that he has never smoked. He has never used smokeless tobacco. He reports that he drinks alcohol. He reports that he does not use drugs.  Allergies: No Known Allergies  Medications Prior to Admission  Medication Sig Dispense Refill  . amLODipine (NORVASC) 2.5 MG tablet Take 1 tablet (2.5 mg total) by mouth daily. 90 tablet 2  . clonazePAM (KLONOPIN) 0.5 MG tablet TAKE 1 TABLET BY MOUTH 3 TIMES A DAY AS NEEDED FOR ANXIETY 60 tablet 5  . fluticasone (FLONASE) 50 MCG/ACT nasal spray Place 2 sprays into both nostrils daily as needed for allergies or rhinitis.    . fluticasone furoate-vilanterol (BREO ELLIPTA) 200-25 MCG/INH AEPB Inhale 1 puff into  the lungs daily. 60 each 5  . Loratadine 10 MG CAPS Take 1 tablet by mouth daily as needed (allergies).     . venlafaxine XR (EFFEXOR-XR) 150 MG 24 hr capsule TAKE 1 CAPSULE BY MOUTH EVERY DAY 90 capsule 2  . albuterol (PROVENTIL HFA;VENTOLIN HFA) 108 (90 Base) MCG/ACT inhaler Inhale 2 puffs into the lungs every 6 (six) hours as needed for wheezing or shortness of breath. (Patient not taking: Reported on 12/19/2017) 1 Inhaler 2  . sulfamethoxazole-trimethoprim (BACTRIM DS,SEPTRA DS) 800-160 MG tablet TAKE 1 TABLET BY MOUTH TWICE A DAY *BEGIN MORNING OF PROSTATE BIOPSY*  0    No results found for this or any previous visit (from the past 69 hour(s)). No results found.  Review of Systems  Constitutional: Negative.  Negative for chills and fever.  HENT: Negative.   Eyes: Negative.   Respiratory: Negative.   Gastrointestinal: Negative.   Genitourinary: Negative.   Musculoskeletal: Negative.   Skin: Negative.   Neurological: Negative.   Endo/Heme/Allergies: Negative.   Psychiatric/Behavioral: Negative.     Blood pressure (!) 138/93, pulse 97, temperature 97.8 F (36.6 C), temperature source Oral, resp. rate 18, height 5' 7.5" (1.715 m), weight 94.1 kg (207 lb 8 oz), SpO2 97 %. Physical Exam  Constitutional: He appears well-developed.  HENT:  Head: Normocephalic.  Eyes: Pupils  are equal, round, and reactive to light.  Neck: Normal range of motion.  Cardiovascular: Normal rate.  Respiratory: Effort normal.  Genitourinary:  Genitourinary Comments: NO CVAT  Musculoskeletal: Normal range of motion.  Neurological: He is alert.  Skin: Skin is warm.  Psychiatric: He has a normal mood and affect.     Assessment/Plan  Proceed as planned with robotic prostatectomy + ICG + Nodes for high risk cancer. Risks, benefits, alternatives, expected peri-op course discussed previously and reiterated today.   Gerald Frock, MD 12/30/2017, 6:46 AM

## 2017-12-30 NOTE — Brief Op Note (Signed)
12/30/2017  10:22 AM  PATIENT:  Gerald Boyer  57 y.o. male  PRE-OPERATIVE DIAGNOSIS:  PROSTATE CANCER  POST-OPERATIVE DIAGNOSIS:  PROSTATE CANCER  PROCEDURE:  Procedure(s): XI ROBOTIC ASSISTED LAPAROSCOPIC RADICAL PROSTATECTOMY WITH PELVIC LYMPHADENECTOMY AND INJECTION OF INDOCYANINE GREEN DYE (N/A) LYMPHADENECTOMY (Bilateral)  SURGEON:  Surgeon(s) and Role:    Alexis Frock, MD - Primary  PHYSICIAN ASSISTANT:   ASSISTANTS: Debbrah Alar PA   ANESTHESIA:   local and general  EBL:  280 mL   BLOOD ADMINISTERED:none  DRAINS: JP to bulb, Foley to gravity   LOCAL MEDICATIONS USED:  MARCAINE     SPECIMEN:  Source of Specimen:  1 - prostatecotmy, 2 - pelvic lymph nodes, 3 - perivesical lymph nodes, 4 - periprostatic fat  DISPOSITION OF SPECIMEN:  PATHOLOGY  COUNTS:  YES   TOURNIQUET:  * No tourniquets in log *  DICTATION: .Other Dictation: Dictation Number  [id number]  PLAN OF CARE: Admit for overnight observation  PATIENT DISPOSITION:  PACU - hemodynamically stable.   Delay start of Pharmacological VTE agent (>24hrs) due to surgical blood loss or risk of bleeding: yes

## 2017-12-30 NOTE — Anesthesia Postprocedure Evaluation (Signed)
Anesthesia Post Note  Patient: Dennie Maizes  Procedure(s) Performed: XI ROBOTIC ASSISTED LAPAROSCOPIC RADICAL PROSTATECTOMY WITH PELVIC LYMPHADENECTOMY AND INJECTION OF INDOCYANINE GREEN DYE (N/A ) LYMPHADENECTOMY (Bilateral )     Patient location during evaluation: PACU Anesthesia Type: General Level of consciousness: awake and alert Pain management: pain level controlled Vital Signs Assessment: post-procedure vital signs reviewed and stable Respiratory status: spontaneous breathing, nonlabored ventilation and respiratory function stable Cardiovascular status: blood pressure returned to baseline and stable Postop Assessment: no apparent nausea or vomiting Anesthetic complications: no    Last Vitals:  Vitals:   12/30/17 1130 12/30/17 1201  BP:  (!) 142/99  Pulse: 88 80  Resp: 14 20  Temp:  36.6 C  SpO2: 95% 95%    Last Pain:  Vitals:   12/30/17 1332  TempSrc:   PainSc: 0-No pain                 Catalina Gravel

## 2017-12-30 NOTE — Transfer of Care (Signed)
Immediate Anesthesia Transfer of Care Note  Patient: Gerald Boyer  Procedure(s) Performed: XI ROBOTIC ASSISTED LAPAROSCOPIC RADICAL PROSTATECTOMY WITH PELVIC LYMPHADENECTOMY AND INJECTION OF INDOCYANINE GREEN DYE (N/A ) LYMPHADENECTOMY (Bilateral )  Patient Location: PACU  Anesthesia Type:General  Level of Consciousness: awake, alert , oriented and patient cooperative  Airway & Oxygen Therapy: Patient Spontanous Breathing and Patient connected to face mask oxygen  Post-op Assessment: Report given to RN and Post -op Vital signs reviewed and stable  Post vital signs: Reviewed and stable  Last Vitals:  Vitals Value Taken Time  BP 130/101 12/30/2017 10:39 AM  Temp    Pulse 86 12/30/2017 10:44 AM  Resp 11 12/30/2017 10:44 AM  SpO2 99 % 12/30/2017 10:44 AM  Vitals shown include unvalidated device data.  Last Pain:  Vitals:   12/30/17 0551  TempSrc:   PainSc: 0-No pain      Patients Stated Pain Goal: 3 (62/22/97 [id number])  Complications: No apparent anesthesia complications

## 2017-12-30 NOTE — Discharge Instructions (Signed)

## 2017-12-31 DIAGNOSIS — C61 Malignant neoplasm of prostate: Secondary | ICD-10-CM | POA: Diagnosis not present

## 2017-12-31 LAB — BASIC METABOLIC PANEL
Anion gap: 8 (ref 5–15)
BUN: 9 mg/dL (ref 6–20)
CHLORIDE: 104 mmol/L (ref 101–111)
CO2: 25 mmol/L (ref 22–32)
CREATININE: 1.01 mg/dL (ref 0.61–1.24)
Calcium: 8.3 mg/dL — ABNORMAL LOW (ref 8.9–10.3)
GFR calc Af Amer: >60 mL/min (ref >60)
GFR calc non Af Amer: >60 mL/min (ref >60)
GLUCOSE: 142 mg/dL — AB (ref 65–99)
Potassium: 4.2 mmol/L (ref 3.5–5.1)
SODIUM: 137 mmol/L (ref 135–145)

## 2017-12-31 LAB — HEMOGLOBIN AND HEMATOCRIT, BLOOD
HCT: 42.8 % (ref 39.0–52.0)
HEMOGLOBIN: 14.3 g/dL (ref 13.0–17.0)

## 2017-12-31 MED ORDER — HYDROCODONE-ACETAMINOPHEN 5-325 MG PO TABS
1.0000 | ORAL_TABLET | Freq: Four times a day (QID) | ORAL | Status: DC | PRN
  Administered 2017-12-31: 1 via ORAL
  Filled 2017-12-31: qty 1

## 2017-12-31 MED ORDER — BISACODYL 10 MG RE SUPP
10.0000 mg | Freq: Once | RECTAL | Status: AC
  Administered 2017-12-31: 10 mg via RECTAL
  Filled 2017-12-31: qty 1

## 2017-12-31 NOTE — Progress Notes (Signed)
Patient ID: Gerald Boyer, male   DOB: [birth year], 57 y.o.   MRN: [id number]  1 Day Post-Op Subjective: The patient is doing well.  No nausea or vomiting. Pain is adequately controlled.  Objective: Vital signs in last 24 hours: Temp:  [97.6 F (36.4 C)-99.5 F (37.5 C)] 98.3 F (36.8 C) (04/27 0556) Pulse Rate:  [63-93] 63 (04/27 0556) Resp:  [10-20] 20 (04/27 0556) BP: (117-142)/(84-101) 139/94 (04/27 0556) SpO2:  [93 %-99 %] 97 % (04/27 0556)  Intake/Output from previous day: 04/26 0701 - 04/27 0700 In: 3013.3 [P.O.:822; I.V.:2091.3; IV Piggyback:100] Out: 2995 [Urine:2530; Drains:185; Blood:280] Intake/Output this shift: No intake/output data recorded.  Physical Exam:  General: Alert and oriented. CV: RRR Lungs: Clear bilaterally. GI: Soft, Nondistended. Incisions: Clean, dry, and intact Urine: Clear Extremities: Nontender, no erythema, no edema.  Lab Results: Recent Labs    12/30/17 1055 12/31/17 0415  HGB 16.1 14.3  HCT 46.5 42.8      Assessment/Plan: POD# 1 s/p robotic prostatectomy.  1) SL IVF 2) Ambulate, Incentive spirometry 3) Transition to oral pain medication 4) Dulcolax suppository 5) D/C pelvic drain 6) Plan for likely discharge later today   Gerald Boyer. MD   LOS: 0 days   Gerald Boyer,LES 12/31/2017, 8:39 AM

## 2017-12-31 NOTE — Progress Notes (Signed)
  Pt discharge instructions reviewed with pt and family member. Transported via wheelchair to d/c. No questions or concerns at this time.

## 2017-12-31 NOTE — Discharge Summary (Signed)
  Date of admission: 12/30/2017  Date of discharge: 12/31/2017  Admission diagnosis: Prostate Cancer  Discharge diagnosis: Prostate Cancer  History and Physical: For full details, please see admission history and physical. Briefly, Gerald Boyer is a 57 y.o. gentleman with localized prostate cancer.  After discussing management/treatment options, he elected to proceed with surgical treatment.  Hospital Course: Jalan Fariss was taken to the operating room on 12/30/2017 and underwent a robotic assisted laparoscopic radical prostatectomy. He tolerated this procedure well and without complications. Postoperatively, he was able to be transferred to a regular hospital room following recovery from anesthesia.  He was able to begin ambulating the night of surgery. He remained hemodynamically stable overnight.  He had excellent urine output with appropriately minimal output from his pelvic drain and his pelvic drain was removed on POD #1.  He was transitioned to oral pain medication, tolerated a clear liquid diet, and had met all discharge criteria and was able to be discharged home later on POD#1.  Laboratory values:  Recent Labs    12/30/17 1055 12/31/17 0415  HGB 16.1 14.3  HCT 46.5 42.8    Disposition: Home  Discharge instruction: He was instructed to be ambulatory but to refrain from heavy lifting, strenuous activity, or driving. He was instructed on urethral catheter care.  Discharge medications:   Allergies as of 12/31/2017   No Known Allergies     Medication List    TAKE these medications   albuterol 108 (90 Base) MCG/ACT inhaler Commonly known as:  PROVENTIL HFA;VENTOLIN HFA Inhale 2 puffs into the lungs every 6 (six) hours as needed for wheezing or shortness of breath.   amLODipine 2.5 MG tablet Commonly known as:  NORVASC Take 1 tablet (2.5 mg total) by mouth daily.   clonazePAM 0.5 MG tablet Commonly known as:  KLONOPIN TAKE 1 TABLET BY MOUTH 3 TIMES A DAY AS  NEEDED FOR ANXIETY   fluticasone 50 MCG/ACT nasal spray Commonly known as:  FLONASE Place 2 sprays into both nostrils daily as needed for allergies or rhinitis.   fluticasone furoate-vilanterol 200-25 MCG/INH Aepb Commonly known as:  BREO ELLIPTA Inhale 1 puff into the lungs daily.   HYDROcodone-acetaminophen 5-325 MG tablet Commonly known as:  NORCO Take 1-2 tablets by mouth every 6 (six) hours as needed for moderate pain or severe pain.   Loratadine 10 MG Caps Take 1 tablet by mouth daily as needed (allergies).   sulfamethoxazole-trimethoprim 800-160 MG tablet Commonly known as:  BACTRIM DS,SEPTRA DS Take 1 tablet by mouth 2 (two) times daily. Start the day prior to foley removal appointment What changed:  See the new instructions.   venlafaxine XR 150 MG 24 hr capsule Commonly known as:  EFFEXOR-XR TAKE 1 CAPSULE BY MOUTH EVERY DAY       Followup: He will followup in 1 week for catheter removal and to discuss his surgical pathology results.

## 2018-01-02 NOTE — Op Note (Signed)
NAME:  Gerald Boyer, Gerald Boyer NO.:  MEDICAL RECORD NO.:  [id number]  LOCATION:                                 FACILITY:  PHYSICIAN:  Alexis Frock, MD          DATE OF BIRTH:  DATE OF PROCEDURE:  12/30/2017                                OPERATIVE REPORT   DIAGNOSIS:  High-risk prostate cancer.  PROCEDURES: 1. Robotic-assisted laparoscopic radical prostatectomy. 2. Bilateral pelvic lymphangiectomy with ICG dye.  ESTIMATED BLOOD LOSS:  250 cc.  COMPLICATION:  None.  SPECIMENS: 1. Radical prostatectomy. 2. Periprostatic fat. 3. Right external iliac lymph node, sentinel. 4. Right obturator lymph node, sentinel. 5. Right perivesical lymph node, sentinel. 6. Left perivesical lymph node, sentinel. 7. Left external iliac lymph nodes. 8. Left obturator lymph nodes, all for permanent pathology.  ASSISTANT:  Debbrah Alar, PA  DRAINS: 1. Jackson-Pratt drain to bulb suction. 2. Foley catheter to straight drain.  FINDINGS: 1. Significant amount of intraabdominal fat. 2. No evidence of active diverticulitis. 3. Sentinel lymph node mostly perivesical, but also with right     external iliac and right obturator groups respectively.  INDICATION:  The patient is a pleasant 57 year old gentleman, who was found on workup of extremely elevated PSA that was rising and have large volume Gleason 7 disease.  His PSA was extremely high.  He underwent staging imaging to rule out metastatic disease.  There was no overt metastatic disease.  Options were discussed for primary therapy including surveillance protocols versus ablative therapy versus surgical extirpation, and he wished to proceed with surgical extirpation with curative intent.  Informed consent was obtained and placed in the medical record.  PROCEDURE IN DETAIL:  The patient was verified.  Procedure being radical prostatectomy was confirmed.  Procedure was carried out.  Time-out ws performed.  Intravenous  antibiotics were administered.  General endotracheal anesthesia was introduced.  The patient was placed into a low lithotomy position.  Sterile field was created by prepping and draping the patient's penis, perineum and proximal thighs using iodine and his infra-xiphoid abdomen using chlorhexidine gluconate.  He was further fashioned to the operating table using 3-inch tape over foam padding across his supraxiphoid chest.  A test of steep Trendelenburg positioning was performed and he was to be suitably positioned.  Foley catheter was placed per urethra to straight drain.  Next, a high-flow, low-pressure pneumoperitoneum was obtained using Veress technique in the supraumbilical midline having passed the aspiration and drop test.  An 8- mm robotic camera port was then placed in the same location. Laparoscopic examination of the peritoneal cavity revealed no significant adhesions and no visceral injury.  There was a significant amount of intraabdominal fat as anticipated.  Additional ports were then placed as follows:  Right paramedian 8-mm robotic port, right far lateral 12-mm assistant port, right paramedian 5-mm suction port, left paramedian 8-mm robotic port and left far lateral 8-mm robotic port. Robot was docked and passed through the electronic checks.  Now, attention was directed at development of the space of Retzius.  An incision was made lateral to the right medial umbilical ligament from the midline towards  the area of the right internal ring coursing along the iliac vessels towards the area of the ureter.  The right vas deferens was encountered and purposely ligated using medial bucket handle, and the right bladder wall was swept away from the pelvic sidewall towards the endopelvic fascia on the right side.  A mirror- imaged dissection was performed on the left side.  Anterior attachments were taken down with cautery scissors.  This exposed the anterior base of the prostate and  bladder neck junction.  This area was defatted to allow better demarcation of this and this fat was set aside, labeled as periprostatic fat.  Next, 0.2 cc of indocyanine green dye was injected using a percutaneously-placed, robotically-guided needle with intervening suctioning to prevent dye spillage, which did not occur. Next, the endopelvic fascia was swept away from the prostate in a base- to-apex orientation bilaterally.  There was an accessory right artery that was coursing towards the area of the dorsal venous complex, likely representing some penile blood flow, and this was very carefully saved. This exposed the dorsal venous complex, which was controlled using vascular load stapler, taken exquisite care to avoid injury to the accessory penile artery.  It had been approximately 10 minutes post dye injection.  The pelvis was inspected under near-infrared fluorescence light.  There were multiple lymphatic channels seen coursing over the pelvic lymph node fields bilaterally.  There was a significant chain of perivesical lymph nodes noted as well on each side.  As such, lymph node dissection was performed on the right side.  First, the right external iliac group with confines being right external iliac artery, vein, pelvic sidewall, iliac bifurcation and this was set aside, labeled as right external iliac lymph node, sentinel.  Next, right obturator group was dissected with confines being right external iliac vein, pelvic sidewall, obturator nerve, set aside, labeled right obturator lymph node, sentinel.  The obturator nerve was inspected following these maneuvers and found to be intact.  The chain of perivesical lymph nodes was also dissected free and set aside, labeled as such.  Left-sided lymphangiectomy was performed of the left external iliac and left obturator groups respectively.  As per the right side, there were no obvious sentinel lymph nodes in this area.  There was a large  chain of sentinel lymph nodes noted along the left perivesical region.  These were dissected free, set aside, labeled as such.  Attention was directed at bladder neck dissection.  A lateral release was performed to further demarcate the bladder neck prostate junction, which was then carefully separated in anterior to posterior direction keeping what appeared to be a rim of circular muscle fibers just to the bladder neck and prostatectomy specimen.  Posterior dissection was performed by incising approximately 7 mm inferior-posterior to posterior lobe of the prostate entering the plane of Denonvilliers.  Bilateral vasa deferentia were encountered, dissected for distance approximately 4 cm, ligated and placed on gentle superior traction.  Bilateral seminal vesicles were dissected to their tips and placed on gentle superior traction. Dissection was proceeded towards the apex of the prostate.  This exposed the vascular pedicles bilaterally, they were quite thick.  Given high- risk disease, a non-nerve-sparing, very wide pedicle dissection was performed using sequential clipping technique in a base-to-apex orientation.  Final apical dissection was performed in the anterior plane transecting the membranous urethra, keeping what appeared to be an adequate urethral stump.  This was completely freed up the prostatectomy specimen, which was placed in an EndoCatch bag for  later retrieval. Digital rectal exam was then performed using indicator glove under laparoscopic vision.  No evidence of rectal violation was noted. Posterior dissection was performed using a 3-0 V-Loc suture reapproximating the posterior urethral plate to the posterior bladder neck bringing these structures into tension-free apposition.  Mucosa-to- mucosa anastomosis was performed using a double-armed 3-0 V-Loc suture bringing the bladder neck membranous urethra by continuity.  A new Foley catheter was then placed per urethra to  straight drain, irrigated quantitatively.  Sponge and needle counts were correct.  Hemostasis appeared excellent.  A closed suction drain was brought through the previous left lateral most robotic port site near the peritoneal cavity. Robot was then undocked.  Specimen was retrieved by extending the previous camera port site superiorly for distance approximately 3 cm removing the prostatectomy specimens, setting aside for permanent pathology.  This site was closed at the level of the fascia using figure- of-eight PDS x3 followed by reapproximation of the Scarpa's with running Vicryl.  All incision sites were infiltrated with dilute lyophilized Marcaine and closed at the level of the skin using subcuticular Monocryl followed by Dermabond, procedure was then terminated.  The patient tolerated the procedure well.  There were no immediate periprocedural complications.  The patient was taken to the postanesthesia care unit in stable condition.  Please note, first assistant, Debbrah Alar, was absolutely crucial for all robotic portions of the procedure today.  She provided invaluable retraction, vascular clipping, suture manipulation, specimen manipulation and suctioning; without which, this would not be possible.          ______________________________ Alexis Frock, MD     TM/MEDQ  D:  12/30/2017  T:  12/31/2017  Job:  [id number]

## 2018-01-12 DIAGNOSIS — H524 Presbyopia: Secondary | ICD-10-CM | POA: Diagnosis not present

## 2018-01-27 DIAGNOSIS — M6281 Muscle weakness (generalized): Secondary | ICD-10-CM | POA: Diagnosis not present

## 2018-01-27 DIAGNOSIS — M62838 Other muscle spasm: Secondary | ICD-10-CM | POA: Diagnosis not present

## 2018-02-10 DIAGNOSIS — M62838 Other muscle spasm: Secondary | ICD-10-CM | POA: Diagnosis not present

## 2018-02-10 DIAGNOSIS — M6281 Muscle weakness (generalized): Secondary | ICD-10-CM | POA: Diagnosis not present

## 2018-02-23 ENCOUNTER — Other Ambulatory Visit: Payer: Self-pay | Admitting: Family Medicine

## 2018-02-23 DIAGNOSIS — J45909 Unspecified asthma, uncomplicated: Secondary | ICD-10-CM

## 2018-03-31 DIAGNOSIS — M6281 Muscle weakness (generalized): Secondary | ICD-10-CM | POA: Diagnosis not present

## 2018-03-31 DIAGNOSIS — M62838 Other muscle spasm: Secondary | ICD-10-CM | POA: Diagnosis not present

## 2018-04-10 DIAGNOSIS — C61 Malignant neoplasm of prostate: Secondary | ICD-10-CM | POA: Diagnosis not present

## 2018-06-17 ENCOUNTER — Other Ambulatory Visit: Payer: Self-pay | Admitting: Family Medicine

## 2018-06-18 ENCOUNTER — Other Ambulatory Visit: Payer: Self-pay | Admitting: Family Medicine

## 2018-07-13 ENCOUNTER — Encounter: Payer: Self-pay | Admitting: Emergency Medicine

## 2018-07-13 ENCOUNTER — Other Ambulatory Visit: Payer: Self-pay

## 2018-07-13 ENCOUNTER — Emergency Department: Payer: 59

## 2018-07-13 ENCOUNTER — Emergency Department
Admission: EM | Admit: 2018-07-13 | Discharge: 2018-07-13 | Disposition: A | Discharge status: Home / Self Care | Payer: 59 | Attending: Emergency Medicine | Admitting: Emergency Medicine

## 2018-07-13 DIAGNOSIS — R0789 Other chest pain: Secondary | ICD-10-CM | POA: Diagnosis not present

## 2018-07-13 DIAGNOSIS — I10 Essential (primary) hypertension: Secondary | ICD-10-CM | POA: Insufficient documentation

## 2018-07-13 DIAGNOSIS — R079 Chest pain, unspecified: Secondary | ICD-10-CM

## 2018-07-13 LAB — CBC
HEMATOCRIT: 49.8 % (ref 39.0–52.0)
Hemoglobin: 17.5 g/dL — ABNORMAL HIGH (ref 13.0–17.0)
MCH: 33.8 pg (ref 26.0–34.0)
MCHC: 35.1 g/dL (ref 30.0–36.0)
MCV: 96.3 fL (ref 80.0–100.0)
NRBC: 0 % (ref 0.0–0.2)
PLATELETS: 285 10*3/uL (ref 150–400)
RBC: 5.17 MIL/uL (ref 4.22–5.81)
RDW: 12.7 % (ref 11.5–15.5)
WBC: 7.9 10*3/uL (ref 4.0–10.5)

## 2018-07-13 LAB — BASIC METABOLIC PANEL
Anion gap: 7 (ref 5–15)
BUN: 12 mg/dL (ref 6–20)
CALCIUM: 9 mg/dL (ref 8.9–10.3)
CHLORIDE: 104 mmol/L (ref 98–111)
CO2: 24 mmol/L (ref 22–32)
CREATININE: 0.93 mg/dL (ref 0.61–1.24)
GFR calc non Af Amer: >60 mL/min (ref >60)
Glucose, Bld: 119 mg/dL — ABNORMAL HIGH (ref 70–99)
Potassium: 4.1 mmol/L (ref 3.5–5.1)
Sodium: 135 mmol/L (ref 135–145)

## 2018-07-13 LAB — TROPONIN I
Troponin I: <0.03 ng/mL (ref <0.03)
Troponin I: <0.03 ng/mL (ref <0.03)

## 2018-07-13 MED ORDER — ONDANSETRON 4 MG PO TBDP
4.0000 mg | ORAL_TABLET | Freq: Once | ORAL | Status: AC | PRN
  Administered 2018-07-13: 4 mg via ORAL

## 2018-07-13 MED ORDER — ONDANSETRON 4 MG PO TBDP
ORAL_TABLET | ORAL | Status: AC
  Filled 2018-07-13: qty 1

## 2018-07-13 NOTE — ED Provider Notes (Signed)
[facility] Emergency Department Provider Note       Time seen: ----------------------------------------- 5:16 PM on 07/13/2018 -----------------------------------------   I have reviewed the triage vital signs and the nursing notes.  HISTORY   Chief Complaint Chest Pain    HPI Gerald Boyer is a 57 y.o. male with a history of allergies, anxiety, diverticulitis, hyperlipidemia and hypertension who presents to the ED for sided chest pain that started last night that is increased in intensity.  Patient states the pain makes him dizzy, short of breath and nauseous.  He reports the pain is tingling and discomfort.  Past Medical History:  Diagnosis Date  . Allergy   . Anxiety   . Diverticulitis   . Hyperlipidemia   . Hypertension     Patient Active Problem List   Diagnosis Date Noted  . Prostate cancer ([place]) 12/30/2017  . Diverticulitis of large intestine with perforation without bleeding 11/28/2017  . Obesity 08/23/2017  . Elevated prostate specific antigen (PSA) 08/04/2015  . Essential (primary) hypertension 08/04/2015  . Ruptured tympanic membrane 08/04/2015  . 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 Hypertriglyceridemia 06/27/2014  . History of colonic polyps 02/21/2012  . Panic disorder 09/06/2001  . Allergic rhinitis due to pollen 09/06/1998    Past Surgical History:  Procedure Laterality Date  . LIPOMA EXCISION     located on  left shoulder  . LYMPHADENECTOMY Bilateral 12/30/2017   Procedure: LYMPHADENECTOMY;  Surgeon: Alexis Frock, MD;  Location: [facility];  Service: Urology;  Laterality: Bilateral;  . PROSTATE BIOPSY  01/2011   outpatient, Dr. Eliberto Ivory; 05/2011- mild chronic inflammation, no dysplasia  . ROBOT ASSISTED LAPAROSCOPIC RADICAL PROSTATECTOMY N/A 12/30/2017   Procedure: XI ROBOTIC ASSISTED LAPAROSCOPIC RADICAL PROSTATECTOMY WITH PELVIC LYMPHADENECTOMY AND INJECTION OF INDOCYANINE GREEN DYE;  Surgeon: Alexis Frock, MD;   Location: [facility];  Service: Urology;  Laterality: N/A;  . TONSILLECTOMY    . VASECTOMY  1995    Allergies Patient has no known allergies.  Social History Social History   Tobacco Use  . Smoking status: Never Smoker  . Smokeless tobacco: Never Used  Substance Use Topics  . Alcohol use: Yes    Alcohol/week: 0.0 standard drinks    Comment: occasional use  . Drug use: No   Review of Systems Constitutional: Negative for fever. Cardiovascular: Positive for chest discomfort and paresthesias Respiratory: Negative for shortness of breath. Gastrointestinal: Negative for abdominal pain, positive for recent nausea Musculoskeletal: Negative for back pain. Skin: Negative for rash. Neurological: Negative for headaches, focal weakness or numbness.  All systems negative/normal/unremarkable except as stated in the HPI  ____________________________________________   PHYSICAL EXAM:  VITAL SIGNS: ED Triage Vitals  Enc Vitals Group     BP 07/13/18 1227 (!) 170/100     Pulse Rate 07/13/18 1227 80     Resp 07/13/18 1227 18     Temp 07/13/18 1227 (!) 97.5 F (36.4 C)     Temp Source 07/13/18 1227 Oral     SpO2 07/13/18 1227 95 %     Weight 07/13/18 1230 210 lb (95.3 kg)     Height 07/13/18 1230 5\' 7"  (1.702 m)     Head Circumference --      Peak Flow --      Pain Score 07/13/18 1230 7     Pain Loc --      Pain Edu? --      Excl. in [place]? --    Constitutional: Alert and oriented. Well appearing and in  no distress. Eyes: Conjunctivae are normal. Normal extraocular movements. ENT   Head: Normocephalic and atraumatic.   Nose: No congestion/rhinnorhea.   Mouth/Throat: Mucous membranes are moist.   Neck: No stridor. Cardiovascular: Normal rate, regular rhythm. No murmurs, rubs, or gallops. Respiratory: Normal respiratory effort without tachypnea nor retractions. Breath sounds are clear and equal bilaterally. No wheezes/rales/rhonchi. Gastrointestinal: Soft and nontender.  Normal bowel sounds Musculoskeletal: Nontender with normal range of motion in extremities. No lower extremity tenderness nor edema. Neurologic:  Normal speech and language. No gross focal neurologic deficits are appreciated.  Skin:  Skin is warm, dry and intact. No rash noted. Psychiatric: Mood and affect are normal. Speech and behavior are normal.  ____________________________________________  EKG: Interpreted by me.  Sinus rhythm rate of 77 bpm, left axis deviation, normal QRS, normal QT  ____________________________________________  ED COURSE:  As part of my medical decision making, I reviewed the following data within the [place] History obtained from family if available, nursing notes, old chart and ekg, as well as notes from prior ED visits. Patient presented for nonspecific chest pain, we will assess with labs and imaging as indicated at this time.   Procedures ____________________________________________   LABS (pertinent positives/negatives)  Labs Reviewed  BASIC METABOLIC PANEL - Abnormal; Notable for the following components:      Result Value   Glucose, Bld 119 (*)    All other components within normal limits  CBC - Abnormal; Notable for the following components:   Hemoglobin 17.5 (*)    All other components within normal limits  TROPONIN I  TROPONIN I    RADIOLOGY  Chest x-ray is unremarkable  ____________________________________________  DIFFERENTIAL DIAGNOSIS   Panic attack, GERD, MI, unstable angina, PE, pneumothorax  FINAL ASSESSMENT AND PLAN  Chest pain   Plan: The patient had presented for nonspecific chest pain which sounds more like anxiety but is unclear in etiology. Patient's labs were negative including repeat troponin testing. Patient's imaging was also negative.  He will be referred to cardiology for outpatient follow-up.   Laurence Aly, MD   Note: This note was generated in part or whole with voice  recognition software. Voice recognition is usually quite accurate but there are transcription errors that can and very often do occur. I apologize for any typographical errors that were not detected and corrected.     Earleen Newport, MD 07/13/18 1754

## 2018-07-13 NOTE — ED Triage Notes (Signed)
Pt arrived after being sent from work with concerns over middle/left sided chest pain that started last night and has increased with intensity. Pt states the pain makes him dizzy, short of breath, and nauseated. Pt reports the pain as a tingling/discomfort.

## 2018-07-13 NOTE — ED Notes (Signed)
Pt reports to front desk that he feels like he going to throw up

## 2018-07-14 ENCOUNTER — Other Ambulatory Visit: Payer: Self-pay | Admitting: Family Medicine

## 2018-07-14 DIAGNOSIS — F410 Panic disorder [episodic paroxysmal anxiety] without agoraphobia: Secondary | ICD-10-CM

## 2018-07-18 DIAGNOSIS — C61 Malignant neoplasm of prostate: Secondary | ICD-10-CM | POA: Diagnosis not present

## 2018-08-01 DIAGNOSIS — C61 Malignant neoplasm of prostate: Secondary | ICD-10-CM | POA: Diagnosis not present

## 2018-08-01 DIAGNOSIS — N393 Stress incontinence (female) (male): Secondary | ICD-10-CM | POA: Diagnosis not present

## 2018-08-07 ENCOUNTER — Other Ambulatory Visit: Payer: Self-pay | Admitting: Urology

## 2018-08-07 DIAGNOSIS — C61 Malignant neoplasm of prostate: Secondary | ICD-10-CM

## 2018-08-15 ENCOUNTER — Encounter (HOSPITAL_COMMUNITY)
Admission: RE | Admit: 2018-08-15 | Discharge: 2018-08-15 | Disposition: A | Discharge status: Home / Self Care | Payer: 59 | Source: Ambulatory Visit | Attending: Urology | Admitting: Urology

## 2018-08-15 DIAGNOSIS — C61 Malignant neoplasm of prostate: Secondary | ICD-10-CM | POA: Insufficient documentation

## 2018-08-15 MED ORDER — AXUMIN (FLUCICLOVINE F 18) INJECTION
9.4500 | Freq: Once | INTRAVENOUS | Status: AC
  Administered 2018-08-15: 9.45 via INTRAVENOUS

## 2018-08-29 ENCOUNTER — Encounter: Payer: Self-pay | Admitting: Emergency Medicine

## 2018-08-29 ENCOUNTER — Emergency Department: Payer: Commercial Managed Care - HMO

## 2018-08-29 ENCOUNTER — Emergency Department
Admission: EM | Admit: 2018-08-29 | Discharge: 2018-08-29 | Disposition: A | Discharge status: Home / Self Care | Payer: Commercial Managed Care - HMO | Attending: Emergency Medicine | Admitting: Emergency Medicine

## 2018-08-29 DIAGNOSIS — K5792 Diverticulitis of intestine, part unspecified, without perforation or abscess without bleeding: Secondary | ICD-10-CM

## 2018-08-29 DIAGNOSIS — R1032 Left lower quadrant pain: Secondary | ICD-10-CM | POA: Diagnosis present

## 2018-08-29 DIAGNOSIS — I10 Essential (primary) hypertension: Secondary | ICD-10-CM | POA: Insufficient documentation

## 2018-08-29 DIAGNOSIS — Z8546 Personal history of malignant neoplasm of prostate: Secondary | ICD-10-CM | POA: Diagnosis not present

## 2018-08-29 DIAGNOSIS — Z79899 Other long term (current) drug therapy: Secondary | ICD-10-CM | POA: Insufficient documentation

## 2018-08-29 DIAGNOSIS — N5089 Other specified disorders of the male genital organs: Secondary | ICD-10-CM | POA: Diagnosis not present

## 2018-08-29 DIAGNOSIS — K5732 Diverticulitis of large intestine without perforation or abscess without bleeding: Secondary | ICD-10-CM | POA: Diagnosis not present

## 2018-08-29 DIAGNOSIS — K5730 Diverticulosis of large intestine without perforation or abscess without bleeding: Secondary | ICD-10-CM | POA: Diagnosis not present

## 2018-08-29 HISTORY — DX: Malignant (primary) neoplasm, unspecified: C80.1

## 2018-08-29 LAB — COMPREHENSIVE METABOLIC PANEL
ALT: 26 U/L (ref 0–44)
AST: 19 U/L (ref 15–41)
Albumin: 3.9 g/dL (ref 3.5–5.0)
Alkaline Phosphatase: 50 U/L (ref 38–126)
Anion gap: 11 (ref 5–15)
BILIRUBIN TOTAL: 1 mg/dL (ref 0.3–1.2)
BUN: 14 mg/dL (ref 6–20)
CO2: 22 mmol/L (ref 22–32)
Calcium: 8.9 mg/dL (ref 8.9–10.3)
Chloride: 101 mmol/L (ref 98–111)
Creatinine, Ser: 0.92 mg/dL (ref 0.61–1.24)
Glucose, Bld: 134 mg/dL — ABNORMAL HIGH (ref 70–99)
POTASSIUM: 3.3 mmol/L — AB (ref 3.5–5.1)
Sodium: 134 mmol/L — ABNORMAL LOW (ref 135–145)
TOTAL PROTEIN: 7.3 g/dL (ref 6.5–8.1)

## 2018-08-29 LAB — URINALYSIS, COMPLETE (UACMP) WITH MICROSCOPIC
BACTERIA UA: NONE SEEN
BILIRUBIN URINE: NEGATIVE
GLUCOSE, UA: NEGATIVE mg/dL
HGB URINE DIPSTICK: NEGATIVE
KETONES UR: 20 mg/dL — AB
LEUKOCYTES UA: NEGATIVE
NITRITE: NEGATIVE
PROTEIN: 100 mg/dL — AB
Specific Gravity, Urine: 1.031 — ABNORMAL HIGH (ref 1.005–1.030)
pH: 5 (ref 5.0–8.0)

## 2018-08-29 LAB — CBC
HEMATOCRIT: 50 % (ref 39.0–52.0)
HEMOGLOBIN: 17.7 g/dL — AB (ref 13.0–17.0)
MCH: 33.7 pg (ref 26.0–34.0)
MCHC: 35.4 g/dL (ref 30.0–36.0)
MCV: 95.2 fL (ref 80.0–100.0)
Platelets: 319 10*3/uL (ref 150–400)
RBC: 5.25 MIL/uL (ref 4.22–5.81)
RDW: 11.9 % (ref 11.5–15.5)
WBC: 9 10*3/uL (ref 4.0–10.5)
nRBC: 0 % (ref 0.0–0.2)

## 2018-08-29 LAB — TROPONIN I

## 2018-08-29 LAB — LIPASE, BLOOD: LIPASE: 31 U/L (ref 11–51)

## 2018-08-29 MED ORDER — SODIUM CHLORIDE 0.9 % IV BOLUS
1000.0000 mL | Freq: Once | INTRAVENOUS | Status: AC
  Administered 2018-08-29: 1000 mL via INTRAVENOUS

## 2018-08-29 MED ORDER — METRONIDAZOLE 500 MG PO TABS
500.0000 mg | ORAL_TABLET | Freq: Once | ORAL | Status: AC
  Administered 2018-08-29: 500 mg via ORAL
  Filled 2018-08-29: qty 1

## 2018-08-29 MED ORDER — METRONIDAZOLE 500 MG PO TABS: 500.0000 mg | ORAL_TABLET | Freq: Three times a day (TID) | ORAL | 0 refills | Status: AC

## 2018-08-29 MED ORDER — IOPAMIDOL (ISOVUE-300) INJECTION 61%
100.0000 mL | Freq: Once | INTRAVENOUS | Status: AC | PRN
  Administered 2018-08-29: 100 mL via INTRAVENOUS

## 2018-08-29 MED ORDER — CIPROFLOXACIN HCL 500 MG PO TABS: 500.0000 mg | ORAL_TABLET | Freq: Two times a day (BID) | ORAL | 0 refills | Status: AC

## 2018-08-29 MED ORDER — CIPROFLOXACIN HCL 500 MG PO TABS
500.0000 mg | ORAL_TABLET | Freq: Once | ORAL | Status: AC
  Administered 2018-08-29: 500 mg via ORAL
  Filled 2018-08-29: qty 1

## 2018-08-29 NOTE — ED Triage Notes (Signed)
Pt states that he is having another flare up of diverticulitis. Pt c/o pain and cramping to abd. Reports he has a hx of the same and it feels the same. Pt also reports some dizzy spells intermittently over the past month. Denies CP or SOB with it. Pt states he also has prostate ca that has spread to his bladder.

## 2018-08-29 NOTE — ED Notes (Signed)
Patient transported to CT 

## 2018-08-29 NOTE — ED Provider Notes (Signed)
[facility] Emergency Department Provider Note  ____________________________________________   First MD Initiated Contact with Patient 08/29/18 1616     (approximate)  I have reviewed the triage vital signs and the nursing notes.   HISTORY  Chief Complaint Abdominal Cramping; Abdominal Pain; and Dizziness   HPI Tyshun Tuckerman is a 57 y.o. male with a history of diverticulitis with microperforation who was presented to the emergency department today with 3 days of worsening left lower quadrant abdominal pain.  He states that it feels like his previous diverticulitis episodes.  However, he states that he is having burning throughout his abdomen as well.  Denies any nausea or vomiting.  States that he had blood in his stool yesterday but was bright red blood around the stool and he is a history of hemorrhoids.  Says that this is more consistent with his hemorrhoid bleeds.  Has had microperforation requiring admission in the past.  Also with a history of prostate cancer which is metastasized to his bladder.  Patient not wishing to have pain medication at this time.  Patient also reports several episodes of lightheadedness that last several minutes over the past several months.  Denies any chest pain or shortness of breath.  Says that these episodes may happen when he is sitting or standing and are not provoked.   Past Medical History:  Diagnosis Date  . Allergy   . Anxiety   . Cancer ([place])   . Diverticulitis   . Hyperlipidemia   . Hypertension     Patient Active Problem List   Diagnosis Date Noted  . Prostate cancer ([place]) 12/30/2017  . Diverticulitis of large intestine with perforation without bleeding 11/28/2017  . Obesity 08/23/2017  . Elevated prostate specific antigen (PSA) 08/04/2015  . Essential (primary) hypertension 08/04/2015  . Ruptured tympanic membrane 08/04/2015  . 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 Hypertriglyceridemia 06/27/2014  .  History of colonic polyps 02/21/2012  . Panic disorder 09/06/2001  . Allergic rhinitis due to pollen 09/06/1998    Past Surgical History:  Procedure Laterality Date  . LIPOMA EXCISION     located on  left shoulder  . LYMPHADENECTOMY Bilateral 12/30/2017   Procedure: LYMPHADENECTOMY;  Surgeon: Alexis Frock, MD;  Location: [facility];  Service: Urology;  Laterality: Bilateral;  . PROSTATE BIOPSY  01/2011   outpatient, Dr. Eliberto Ivory; 05/2011- mild chronic inflammation, no dysplasia  . ROBOT ASSISTED LAPAROSCOPIC RADICAL PROSTATECTOMY N/A 12/30/2017   Procedure: XI ROBOTIC ASSISTED LAPAROSCOPIC RADICAL PROSTATECTOMY WITH PELVIC LYMPHADENECTOMY AND INJECTION OF INDOCYANINE GREEN DYE;  Surgeon: Alexis Frock, MD;  Location: [facility];  Service: Urology;  Laterality: N/A;  . TONSILLECTOMY    . VASECTOMY  1995    Prior to Admission medications   Medication Sig Start Date End Date Taking? Authorizing Provider  albuterol (PROVENTIL HFA;VENTOLIN HFA) 108 (90 Base) MCG/ACT inhaler Inhale 2 puffs into the lungs every 6 (six) hours as needed for wheezing or shortness of breath. Patient not taking: Reported on 12/19/2017 02/12/16   Birdie Sons, MD  amLODipine (NORVASC) 2.5 MG tablet TAKE 1 TABLET BY MOUTH EVERY DAY 06/18/18   Birdie Sons, MD  BREO ELLIPTA 200-25 MCG/INH AEPB TAKE 1 PUFF BY MOUTH EVERY DAY**NEEDS OFFICE VISIT** 02/23/18   Birdie Sons, MD  clonazePAM (KLONOPIN) 0.5 MG tablet TAKE 1 TABLET BY MOUTH THREE TIMES A DAY AS NEEDED FOR ANXIETY 07/14/18   Birdie Sons, MD  fluticasone (FLONASE) 50 MCG/ACT nasal spray Place 2 sprays into  both nostrils daily as needed for allergies or rhinitis.    [provider]  HYDROcodone-acetaminophen (NORCO) 5-325 MG tablet Take 1-2 tablets by mouth every 6 (six) hours as needed for moderate pain or severe pain. 12/30/17   Debbrah Alar, PA-C  Loratadine 10 MG CAPS Take 1 tablet by mouth daily as needed (allergies).     [provider]    sulfamethoxazole-trimethoprim (BACTRIM DS,SEPTRA DS) 800-160 MG tablet Take 1 tablet by mouth 2 (two) times daily. Start the day prior to foley removal appointment 12/30/17   Debbrah Alar, PA-C  venlafaxine XR (EFFEXOR-XR) 150 MG 24 hr capsule TAKE 1 CAPSULE BY MOUTH EVERY DAY 06/17/18   Birdie Sons, MD    Allergies Patient has no known allergies.  Family History  Problem Relation Age of Onset  . COPD Mother   . Kidney cancer Mother   . Rectal cancer Daughter   . Hypertension Other     Social History Social History   Tobacco Use  . Smoking status: Never Smoker  . Smokeless tobacco: Never Used  Substance Use Topics  . Alcohol use: Yes    Alcohol/week: 0.0 standard drinks    Comment: occasional use  . Drug use: No    Review of Systems  Constitutional: No fever/chills Eyes: No visual changes. ENT: No sore throat. Cardiovascular: Denies chest pain. Respiratory: Denies shortness of breath. Gastrointestinal: No nausea, no vomiting.  No diarrhea.  No constipation. Genitourinary: Negative for dysuria. Musculoskeletal: Negative for back pain. Skin: Negative for rash. Neurological: Negative for headaches, focal weakness or numbness.   ____________________________________________   PHYSICAL EXAM:  VITAL SIGNS: ED Triage Vitals  Enc Vitals Group     BP 08/29/18 1400 (!) 152/97     Pulse Rate 08/29/18 1400 100     Resp 08/29/18 1400 20     Temp 08/29/18 1400 98.6 F (37 C)     Temp Source 08/29/18 1400 Oral     SpO2 08/29/18 1400 95 %     Weight 08/29/18 1401 215 lb (97.5 kg)     Height 08/29/18 1401 5\' 7"  (1.702 m)     Head Circumference --      Peak Flow --      Pain Score 08/29/18 1401 5     Pain Loc --      Pain Edu? --      Excl. in [place]? --     Constitutional: Alert and oriented. Well appearing and in no acute distress. Eyes: Conjunctivae are normal.  Head: Atraumatic. Nose: No congestion/rhinnorhea. Mouth/Throat: Mucous membranes are moist.   Neck: No stridor.   Cardiovascular: Normal rate, regular rhythm. Grossly normal heart sounds.  Good peripheral circulation. Respiratory: Normal respiratory effort.  No retractions. Lungs CTAB. Gastrointestinal: Soft with left lower quadrant abdominal tenderness to palpation without any rebound or guarding.  No distention. Musculoskeletal: No lower extremity tenderness nor edema.  No joint effusions. Neurologic:  Normal speech and language. No gross focal neurologic deficits are appreciated. Skin:  Skin is warm, dry and intact. No rash noted. Psychiatric: Mood and affect are normal. Speech and behavior are normal.  ____________________________________________   LABS (all labs ordered are listed, but only abnormal results are displayed)  Labs Reviewed  CBC - Abnormal; Notable for the following components:      Result Value   Hemoglobin 17.7 (*)    All other components within normal limits  URINALYSIS, COMPLETE (UACMP) WITH MICROSCOPIC - Abnormal; Notable for the following components:  Color, Urine AMBER (*)    APPearance CLEAR (*)    Specific Gravity, Urine 1.031 (*)    Ketones, ur 20 (*)    Protein, ur 100 (*)    All other components within normal limits  COMPREHENSIVE METABOLIC PANEL - Abnormal; Notable for the following components:   Sodium 134 (*)    Potassium 3.3 (*)    Glucose, Bld 134 (*)    All other components within normal limits  LIPASE, BLOOD  TROPONIN I   ____________________________________________  EKG  ED ECG REPORT I, Doran Stabler, the attending physician, personally viewed and interpreted this ECG.   Date: 08/29/2018  EKG Time: 1410  Rate: 92  Rhythm: normal sinus rhythm  Axis: Left axis  Intervals:none  ST&T Change: No ST segment elevation or depression.  No abnormal T wave inversion.  ____________________________________________  RADIOLOGY  CT of the abdomen and pelvis with acute proximal sigmoid diverticulitis without abscess or free  air.  New low-density fluid collection adjacent to the left obturator internus measuring 3.8 x 2.3 x 2.5 cm.  Possibly small enteric duplication cyst, seroma or lymphocele or stigmata of prior diverticulitis.  I discussed the case with Dr. Randel Pigg who states that he does not believe this to be an abscess.  It is remote from the area of diverticulitis.  Furthermore, Dr. Randel Pigg states that it was on the PET/CT from December 10.    ____________________________________________   PROCEDURES  Procedure(s) performed:   Procedures  Critical Care performed:   ____________________________________________   INITIAL IMPRESSION / [place] / ED COURSE  Pertinent labs & imaging results that were available during my care of the patient were reviewed by me and considered in my medical decision making (see chart for details).  Differential diagnosis includes, but is not limited to, acute appendicitis, renal colic, testicular torsion, urinary tract infection/pyelonephritis, prostatitis,  epididymitis, diverticulitis, small bowel obstruction or ileus, colitis, abdominal aortic aneurysm, gastroenteritis, hernia, etc. As part of my medical decision making, I reviewed the following data within the electronic MEDICAL RECORD NUMBER Notes from prior ED visits  ----------------------------------------- 5:54 PM on 08/29/2018 -----------------------------------------  Patient updated regarding the CT results including the diverticulitis as well as the cyst.  He appears comfortable at this time.  He will be given a first dose of Cipro and Flagyl in the emergency department and then will be discharged home with 10 days of Cipro Flagyl.   ____________________________________________   FINAL CLINICAL IMPRESSION(S) / ED DIAGNOSES  Diverticulitis.  Pelvic cyst.  NEW MEDICATIONS STARTED DURING THIS VISIT:  New Prescriptions   No medications on file     Note:  This document was prepared using Dragon voice  recognition software and may include unintentional dictation errors.     Orbie Pyo, MD 08/29/18 [phone]

## 2018-09-08 DIAGNOSIS — N393 Stress incontinence (female) (male): Secondary | ICD-10-CM | POA: Diagnosis not present

## 2018-09-08 DIAGNOSIS — C61 Malignant neoplasm of prostate: Secondary | ICD-10-CM | POA: Diagnosis not present

## 2018-09-11 ENCOUNTER — Encounter: Payer: Self-pay | Admitting: *Deleted

## 2018-09-14 ENCOUNTER — Other Ambulatory Visit: Payer: Self-pay | Admitting: Family Medicine

## 2018-09-14 DIAGNOSIS — J45909 Unspecified asthma, uncomplicated: Secondary | ICD-10-CM

## 2018-09-25 ENCOUNTER — Encounter: Payer: Self-pay | Admitting: Radiation Oncology

## 2018-09-25 NOTE — Progress Notes (Signed)
GU Location of Tumor / Histology: high risk prostate cancer  If Prostate Cancer, Gleason Score is (4 + 5) and PSA is (70.4) pre surgery.   Gerald Boyer had a prostatectomy in 12/2017. Multifocal positive margins at several sites of extra prostatic extension noted. He has had 3 prior prostate biopsies by Dr. Yves Dill all negative.   Diagnosis 1. Fatty tissue, peri prostatic fat BENIGN FIBROADIPOSE TISSUE 2. Lymph node, sentinel, biopsy, right external iliac TWO BENIGN LYMPH NODES (0/2) 3. Lymph node, sentinel, biopsy, right perivesical BENIGN FIBROADIPOSE TISSUE 4. Lymph node, sentinel, biopsy, right obturator THREE BENIGN LYMPH NODES (0/3) 5. Lymph node, biopsy, left external THREE BENIGN LYMPH NODES (0/3) 6. Lymph node, biopsy, left perivesical BENIGN FIBROMUSCULAR TISSUE 7. Lymph node, biopsy, left obturator ONE BENIGN LYMPH NODE (0/1) 8. Prostate, radical resection PROSTATIC ADENOCARCINOMA, GLEASON SCORE 4+5=9 (PROGNOSTIC GROUP 5) EXTENSIVELY INVOLVES BILATERAL ANTERIOR FROM THE MID TO BLADDER NECK AND BASE OF THE GLAND EXTRAPROSTATIC EXTENSION IS PRESENT AT THE BILATERAL ANTERIOR MID AND BLADDER NECK AREA (PT3A) MARGINS OF RESECTION ARE POSITIVE AT WHERE EPE IS PRESENT RIGHT ANTERIOR MID (4 MM, GLEASON SCORE 4+4=8) RIGHT BLADDER NECK AREA (14 MM, GLEASON SCORE 4+4=8) LEFT BLADDER NECK AREA (11 MM, GLEASON SCORE 4+3+7) SEMINAL VESICLES, VASA DEFERENTIA AND OTHER RESECTION MARGINS ARE NEGATIVE FOR TUMOR  Past/Anticipated interventions by urology, if any: MRI fusion biopsy, prostatectomy, CT scan (negative), bone scan (negative), PET (negative)  Past/Anticipated interventions by medical oncology, if any: no  Weight changes, if any: Reports 8 lb weight loss due to diverticulitis flare.  Bowel/Bladder complaints, if any: IPSS 1. SHIM 5. Denies dysuria or hematuria. Reference to leakage he "is almost dry" after participating in PT.    Nausea/Vomiting, if any: no  Pain issues,  if any:  no  SAFETY ISSUES:  Prior radiation? no  Pacemaker/ICD? no  Possible current pregnancy? no, male patient  Is the patient on methotrexate? no  Current Complaints / other details:  58 year old male. Divorced. 2 daughters and 1 son. Work inside [employer] time. Both father and mother with hx of cancer. Reports Dr. Tammi Klippel treated his father who had prostate cancer.

## 2018-09-26 ENCOUNTER — Ambulatory Visit
Admission: RE | Admit: 2018-09-26 | Discharge: 2018-09-26 | Disposition: A | Discharge status: Home / Self Care | Payer: 59 | Source: Ambulatory Visit | Attending: Radiation Oncology | Admitting: Radiation Oncology

## 2018-09-26 ENCOUNTER — Encounter: Payer: Self-pay | Admitting: Medical Oncology

## 2018-09-26 ENCOUNTER — Encounter: Payer: Self-pay | Admitting: Radiation Oncology

## 2018-09-26 ENCOUNTER — Other Ambulatory Visit: Payer: Self-pay

## 2018-09-26 VITALS — BP 155/107 | HR 101 | Temp 98.3°F | Resp 18 | Ht 67.0 in | Wt 210.8 lb

## 2018-09-26 DIAGNOSIS — Z8052 Family history of malignant neoplasm of bladder: Secondary | ICD-10-CM | POA: Insufficient documentation

## 2018-09-26 DIAGNOSIS — E785 Hyperlipidemia, unspecified: Secondary | ICD-10-CM | POA: Diagnosis not present

## 2018-09-26 DIAGNOSIS — F419 Anxiety disorder, unspecified: Secondary | ICD-10-CM | POA: Insufficient documentation

## 2018-09-26 DIAGNOSIS — K5732 Diverticulitis of large intestine without perforation or abscess without bleeding: Secondary | ICD-10-CM | POA: Insufficient documentation

## 2018-09-26 DIAGNOSIS — C61 Malignant neoplasm of prostate: Secondary | ICD-10-CM | POA: Insufficient documentation

## 2018-09-26 DIAGNOSIS — I10 Essential (primary) hypertension: Secondary | ICD-10-CM | POA: Insufficient documentation

## 2018-09-26 DIAGNOSIS — R9721 Rising PSA following treatment for malignant neoplasm of prostate: Secondary | ICD-10-CM | POA: Diagnosis not present

## 2018-09-26 DIAGNOSIS — Z9079 Acquired absence of other genital organ(s): Secondary | ICD-10-CM | POA: Diagnosis not present

## 2018-09-26 DIAGNOSIS — Z79899 Other long term (current) drug therapy: Secondary | ICD-10-CM | POA: Insufficient documentation

## 2018-09-26 DIAGNOSIS — M5136 Other intervertebral disc degeneration, lumbar region: Secondary | ICD-10-CM | POA: Diagnosis not present

## 2018-09-26 HISTORY — DX: Malignant neoplasm of prostate: C61

## 2018-09-26 NOTE — Progress Notes (Signed)
Introduced myself to Mr. Gerald Boyer as the prostate nurse navigator and my role. He is post robotic prostatectomy 12/30/17. His pathology reported bladder neck involement and his PSA continues to rise. He is here today to discuss radiation to the prostate fossa. He lives in [place] and would like to receive treatment at [place] [place]. He states his daughter had rectal cancer at 59 years of age and was treated by Dr. Baruch Gouty so he is very familiar the facility. He informed me he father was treated here last year for prostate cancer and did very well.  I encouraged him to attend the Prostate Support group here at [facility].  I wished him well and  gave him my business card. I encouraged him to call me with questions or concerns.He voiced understanding.

## 2018-09-26 NOTE — Progress Notes (Signed)
Radiation Oncology         [phone] ________________________________  Initial outpatient Consultation  Name: Gerald Boyer MRN: [id number]  Date of Service: 09/26/2018 DOB: [birth year]  JE:HUDJSH, Kirstie Peri, MD  Alexis Frock, MD   REFERRING PHYSICIAN: Alexis Frock, MD  DIAGNOSIS: 58 y/o male with rising, detectable PSA of 4.54 status post RALP for pT3aN0, Gleason 4+5 adenocarcinoma of the prostate with preoperative PSA of 70.4.    ICD-10-CM   1. Prostate cancer [facility]) C61     HISTORY OF PRESENT ILLNESS: Gerald Boyer is a 57 y.o. male seen at the request of Dr. Tresa Moore.  He was initially diagnosed with Gleason 4+3, stage cT2 adenocarcinoma of the prostate with PSA of 70.4 at time of TRUSPBx on 10/13/17.  Staging MRI, CT and bone scans were all negative for evidence of metastatic disease. He elected to proceed with RALP with Dr. Tresa Moore on 12/30/17. Unfortunately, his final surgical pathology revealed Gleason 4+5 with multifocal positive margins at bilateral anterior mid to bladder neck and base and extraprostatic extension at the bilateral anterior mid and bladder neck areas. Seminal vesicles and lymph nodes (0/9) were not involved. His initial postoperative PSA in August 2019 was 3.44 and a current PSA as of November 2019 was up to 4.54.    He underwent an Axumin PET scan on 08/15/2018 which showed no evidence of local recurrence, lymphadenopathy or distant metastatic disease.  He has regained fairly good continence, only requiring 1 liner per day for protection.   He has reviewed the final pathology and recent PSA results with his urologist and has been kindly referred today to discuss the potential for adjuvant radiotherapy to the prostate fossa.   PREVIOUS RADIATION THERAPY: No  PAST MEDICAL HISTORY:  Past Medical History:  Diagnosis Date  . Allergy   . Anxiety   . Cancer ([place])   . Diverticulitis   . Hyperlipidemia   . Hypertension   . Prostate cancer ([place])        PAST SURGICAL HISTORY: Past Surgical History:  Procedure Laterality Date  . LIPOMA EXCISION     located on  left shoulder  . LYMPHADENECTOMY Bilateral 12/30/2017   Procedure: LYMPHADENECTOMY;  Surgeon: Alexis Frock, MD;  Location: [facility];  Service: Urology;  Laterality: Bilateral;  . PROSTATE BIOPSY  01/2011   outpatient, Dr. Eliberto Ivory; 05/2011- mild chronic inflammation, no dysplasia  . ROBOT ASSISTED LAPAROSCOPIC RADICAL PROSTATECTOMY N/A 12/30/2017   Procedure: XI ROBOTIC ASSISTED LAPAROSCOPIC RADICAL PROSTATECTOMY WITH PELVIC LYMPHADENECTOMY AND INJECTION OF INDOCYANINE GREEN DYE;  Surgeon: Alexis Frock, MD;  Location: [facility];  Service: Urology;  Laterality: N/A;  . TONSILLECTOMY    . VASECTOMY  1995    FAMILY HISTORY:  Family History  Problem Relation Age of Onset  . COPD Mother   . Kidney cancer Mother   . Prostate cancer Father   . Rectal cancer Daughter   . Hypertension Other   . Breast cancer Neg Hx   . Colon cancer Neg Hx   . Pancreatic cancer Neg Hx   He states Dr. Tammi Klippel treated his father.   SOCIAL HISTORY:  Social History   Socioeconomic History  . Marital status: Significant Other    Spouse name: IT sales professional  . Number of children: 3  . Years of education: Not on file  . Highest education level: Not on file  Occupational History  . Occupation: Engineer, water  Social Needs  . Financial resource strain: Not on file  . Food  insecurity:    Worry: Not on file    Inability: Not on file  . Transportation needs:    Medical: Not on file    Non-medical: Not on file  Tobacco Use  . Smoking status: Never Smoker  . Smokeless tobacco: Never Used  Substance and Sexual Activity  . Alcohol use: Yes    Alcohol/week: 0.0 standard drinks    Comment: occasional use  . Drug use: No  . Sexual activity: Not Currently  Lifestyle  . Physical activity:    Days per week: Not on file    Minutes per session: Not on file  . Stress: Not on file  Relationships  . Social  connections:    Talks on phone: Not on file    Gets together: Not on file    Attends religious service: Not on file    Active member of club or organization: Not on file    Attends meetings of clubs or organizations: Not on file    Relationship status: Not on file  . Intimate partner violence:    Fear of current or ex partner: Not on file    Emotionally abused: Not on file    Physically abused: Not on file    Forced sexual activity: Not on file  Other Topics Concern  . Not on file  Social History Narrative   Divorced. Has two daughters and one son. Resides in [place] with significant other [name].    ALLERGIES: Patient has no known allergies.  MEDICATIONS:  Current Outpatient Medications  Medication Sig Dispense Refill  . amLODipine (NORVASC) 2.5 MG tablet TAKE 1 TABLET BY MOUTH EVERY DAY 90 tablet 4  . BREO ELLIPTA 200-25 MCG/INH AEPB TAKE 1 PUFF BY MOUTH EVERY DAY**NEEDS OFFICE VISIT** 60 each 4  . clonazePAM (KLONOPIN) 0.5 MG tablet TAKE 1 TABLET BY MOUTH THREE TIMES A DAY AS NEEDED FOR ANXIETY 60 tablet 1  . fluticasone (FLONASE) 50 MCG/ACT nasal spray Place 2 sprays into both nostrils daily as needed for allergies or rhinitis.    . Loratadine 10 MG CAPS Take 1 tablet by mouth daily as needed (allergies).     . venlafaxine XR (EFFEXOR-XR) 150 MG 24 hr capsule TAKE 1 CAPSULE BY MOUTH EVERY DAY 90 capsule 4  . albuterol (PROVENTIL HFA;VENTOLIN HFA) 108 (90 Base) MCG/ACT inhaler Inhale 2 puffs into the lungs every 6 (six) hours as needed for wheezing or shortness of breath. (Patient not taking: Reported on 12/19/2017) 1 Inhaler 2   No current facility-administered medications for this encounter.     REVIEW OF SYSTEMS:  On review of systems, the patient reports that he is doing well overall. He denies any chest pain, shortness of breath, cough, fevers, chills, night sweats, unintended weight changes. He denies any bowel or bladder disturbances, and denies abdominal pain, nausea or  vomiting. He denies any new musculoskeletal or joint aches or pains. His IPSS score was 1, indicating mild urinary symptoms. He has recovered good continence with physical therapy, currently only using one liner per day for occasional stress urinary incontinence with cough, sneeze or straining.  Otherwise, he has an occasional dribble of urine but no large volume leakage. His SHIM score was 5, indicating he has severe erectile dysfunction since the time of surgery. A complete review of systems is obtained and is otherwise negative.  PHYSICAL EXAM:  Wt Readings from Last 3 Encounters:  09/26/18 210 lb 12.8 oz (95.6 kg)  08/29/18 215 lb (97.5 kg)  07/13/18 210 lb (  95.3 kg)   Temp Readings from Last 3 Encounters:  09/26/18 98.3 F (36.8 C)  08/29/18 98.6 F (37 C) (Oral)  07/13/18 (!) 97.5 F (36.4 C) (Oral)   BP Readings from Last 3 Encounters:  09/26/18 (!) 155/107  08/29/18 (!) 174/105  07/13/18 (!) 144/127   Pulse Readings from Last 3 Encounters:  09/26/18 (!) 101  08/29/18 93  07/13/18 86   Pain Assessment Pain Score: 0-No pain/10  In general this is a well appearing Caucasian gentleman in no acute distress. He is alert and oriented x4 and appropriate throughout the examination. HEENT reveals that the patient is normocephalic, atraumatic. EOMs are intact. PERRLA. Skin is intact without any evidence of gross lesions. Cardiovascular exam reveals a regular rate and rhythm, no clicks rubs or murmurs are auscultated. Chest is clear to auscultation bilaterally. Lymphatic assessment is performed and does not reveal any adenopathy in the cervical, supraclavicular, axillary, or inguinal chains. Abdomen has active bowel sounds in all quadrants and is intact. The abdomen is soft, non tender, non distended. Lower extremities are negative for pretibial pitting edema, deep calf tenderness, cyanosis or clubbing.   KPS = 100  100 - Normal; no complaints; no evidence of disease. 90   - Able to  carry on normal activity; minor signs or symptoms of disease. 80   - Normal activity with effort; some signs or symptoms of disease. 50   - Cares for self; unable to carry on normal activity or to do active work. 60   - Requires occasional assistance, but is able to care for most of his personal needs. 50   - Requires considerable assistance and frequent medical care. 48   - Disabled; requires special care and assistance. 69   - Severely disabled; hospital admission is indicated although death not imminent. 55   - Very sick; hospital admission necessary; active supportive treatment necessary. 10   - Moribund; fatal processes progressing rapidly. 0     - Dead  Karnofsky DA, Abelmann Quilcene, Craver LS and Burchenal [facility] [phone]) The use of the nitrogen mustards in the palliative treatment of carcinoma: with particular reference to bronchogenic carcinoma Cancer 1 634-56  LABORATORY DATA:  Lab Results  Component Value Date   WBC 9.0 08/29/2018   HGB 17.7 (H) 08/29/2018   HCT 50.0 08/29/2018   MCV 95.2 08/29/2018   PLT 319 08/29/2018   Lab Results  Component Value Date   NA 134 (L) 08/29/2018   K 3.3 (L) 08/29/2018   CL 101 08/29/2018   CO2 22 08/29/2018   Lab Results  Component Value Date   ALT 26 08/29/2018   AST 19 08/29/2018   ALKPHOS 50 08/29/2018   BILITOT 1.0 08/29/2018     RADIOGRAPHY: Ct Abdomen Pelvis W Contrast  Result Date: 08/29/2018 CLINICAL DATA:  Concern for diverticulitis. EXAM: CT ABDOMEN AND PELVIS WITH CONTRAST TECHNIQUE: Multidetector CT imaging of the abdomen and pelvis was performed using the standard protocol following bolus administration of intravenous contrast. CONTRAST:  151mL ISOVUE-300 IOPAMIDOL (ISOVUE-300) INJECTION 61% COMPARISON:  11/28/2017 CT FINDINGS: Lower chest: Heart size without pericardial effusion or thickening. Dependent atelectasis at each lung base. Hepatobiliary: No focal liver abnormality is seen. No gallstones, gallbladder wall thickening,  or biliary dilatation. Pancreas: Unremarkable. No pancreatic ductal dilatation or surrounding inflammatory changes. Spleen: Normal in size without focal abnormality. Adrenals/Urinary Tract: Normal bilateral adrenal glands. No nephrolithiasis nor obstructive uropathy. No enhancing renal mass. 12 mm hypodensity in the interpolar right kidney consistent  with a cyst is unchanged. Stomach/Bowel: Redemonstration of acute proximal sigmoid diverticulitis with inflammatory thickening of the proximal sigmoid and mesenteric fat stranding noted. No abscess or free air. The remainder of gastrointestinal system is nonacute. Vascular/Lymphatic: Nonaneurysmal abdominal aorta. No adenopathy. Reproductive: Normal size prostate. Other: New low-density focus of fluid adjacent to the left obturator internus measuring 3.8 x 2.3 x 2.5 cm is identified, nonspecific possibly a small enteric duplication, seroma, lymphocele or stigmata of prior diverticulitis among some considerations. Low-density lymph node believed less likely. No significant change from recent PET-CT which demonstrated no hypermetabolic activity within. Musculoskeletal: L5 pars defect with grade 1 anterolisthesis of L5 on S1. Multiple Schmorl's nodes along the lower thoracic spine. Multilevel degenerative disc disease of the lumbar spine. No acute osseous abnormality. IMPRESSION: 1. Acute proximal sigmoid diverticulitis without abscess or free air. 2. New low-density fluid collection adjacent to the left obturator internus measuring 3.8 x 2.3 x 2.5 cm, nonspecific possibly a small enteric duplication cyst, seroma, lymphocele or stigmata of prior diverticulitis among some considerations. 3. Stable 12 mm cyst in the interpolar right kidney. 4. L5 pars defect with grade 1 anterolisthesis of L5 on S1. Electronically Signed   By: Ashley Royalty M.D.   On: 08/29/2018 17:28      IMPRESSION/PLAN: 1. 58 y.o. male with rising, detectable PSA of 4.54 status post RALP for pT3aN0,  Gleason 4+5 adenocarcinoma of the prostate with preoperative PSA of 70.4. Today we reviewed the findings and workup thus far.  We discussed the natural history of prostate cancer.  We reviewed the the implications of positive margins, extracapsular extension and detectable, rising postoperative PSA on the risk of prostate cancer recurrence. We discussed the advantage for patients who undergo adjuvant radiotherapy in this setting in terms of disease control and overall survival. We discussed radiation treatment directed to the prostatic fossa with regard to the logistics and delivery of external beam radiation treatment.  The recommendation is for a 7.5 week course of radiotherapy to the prostate fossa combined with 18-24 months of ADT.  He has not yet started ADT.  At the conclusion of our conversation, the patient elects to proceed with LT-ADT in combination with adjuvant radiotherapy but would prefer to  receive his radiation treatments at [facility] since this is closer to his home and work.  We will share our findings with Dr. Tresa Moore and arrange for a follow up visit to initiate ADT in the near future.  We will also make a referral to Dr. Noreene Filbert at [facility] for consideration of radiation treatments closer to home per patient request.     We enjoyed meeting him today and would be happy to continue to participate in his care should he decide to have radiation treatments in [place].     Nicholos Johns, PA-C    Tyler Pita, MD  [facility] Direct Dial: [phone]  Fax: [phone] [email or web address]  Skype  LinkedIn  This document serves as a record of services personally performed by Tyler Pita, MD and Freeman Caldron, PA-C. It was created on their behalf by Wilburn Mylar, a trained medical scribe. The creation of this record is based on the scribe's personal observations and the provider's statements to them. This  document has been checked and approved by the attending provider.     References:  JAMA. 2006 Nov 15;296(19):2329-35.  Adjuvant radiotherapy for pathologically advanced prostate cancer: a randomized clinical trial.  Grandville Silos IM Jr(1), Tangen CM, Oda Kilts  Junius Finner MS, Ova Freshwater, Messing E, Forman J, Chin J, Swanson G, Canby-Hagino E, Crawford ED.  Author information: (1)Department of Urology, [facility], [place], [address] [place] [address], [place]. [email or web address]  CONTEXT: Despite a stage-shift to earlier cancer stages and lower tumor volumes for prostate cancer, pathologically advanced disease is detected at radical prostatectomy in 38% to 52% of patients. However, the optimal management of these patients after radical prostatectomy is unknown.  OBJECTIVE: To determine whether adjuvant radiotherapy improves metastasis-free survival in patients with stage pT3 N0 M0 prostate cancer.  DESIGN, SETTING, AND PATIENTS: Randomized, prospective, multi-institutional, [place] clinical trial with enrollment between April 21, 1987, and September 07, 1995 (with database frozen for statistical analysis on May 27, 2004). Patients were 51 men with pathologically advanced prostate cancer who had undergone radical prostatectomy. INTERVENTION: Men were randomly assigned to receive 60 to 64 Gy of external beam radiotherapy delivered to the prostatic fossa (n = 214) or usual care plus observation (n = 211).  MAIN OUTCOME MEASURES: Primary outcome was metastasis-free survival, defined as time to first occurrence of metastatic disease or death due to any cause. Secondary outcomes included prostate-specific antigen (PSA) relapse, recurrence-free survival, overall survival, freedom from hormonal therapy, and postoperative complications.  RESULTS: Among the 425 men, median follow-up was 10.6 years (interquartile range, 9.2-12.7 years). For  metastasis-free survival, 76 (35.5%) of 214 men in the adjuvant radiotherapy group were diagnosed with metastatic disease or died (median metastasis-free estimate, 14.7 years), compared with 91 (43.1%) of 211 (median metastasis-free estimate, 13.2 years) of those in the observation group (hazard ratio [HR], 0.75; 95% CI, 0.55-1.02; P = .06). There were no significant between-group differences for overall survival (71 deaths, median survival of 14.7 years for radiotherapy vs 83 deaths, median survival of 13.8 years for observation; HR, 0.80; 95% CI, 0.58-1.09; P = .16). PSA relapse (median PSA relapse-free survival, 10.3 years for radiotherapy vs 3.1 years for observation; HR, 0.43; 95% CI, 0.31-0.58; P<.001) and disease recurrence (median recurrence-free survival, 13.8 years for radiotherapy vs 9.9 years for observation; HR, 0.62; 95% CI, 0.46-0.82; P = .001) were both significantly reduced with radiotherapy. Adverse effects were more common with radiotherapy vs observation (23.8% vs 11.9%), including rectal complications (7.4% vs 0%), urethral strictures (17.8% vs 9.5%), and total urinary incontinence (6.5% vs 2.8%).  CONCLUSIONS: In men who had undergone radical prostatectomy for pathologically advanced prostate cancer, adjuvant radiotherapy resulted in significantly reduced risk of PSA relapse and disease recurrence, although the improvements in metastasis-free survival and overall survival were not statistically significant.  Trial Registration clinicaltrials.gov Identifier: [id number]. PMID: [id number]   J Clin Oncol. 2007 Jun 1;25(16):2225-9. Predominant treatment failure in postprostatectomy patients is local: analysis of patterns of treatment failure in SWOG 8794.  Swanson GP(1), Blaine MA, Tangen CM, Chin J, Messing E, Canby-Hagino Daiva Eves, Doy Hutching ED;  Author information: (1)Department of Radiation Oncology and Urology, [facility], [place]  [address], [place]. [email or web address] Comment in [place]. 2007 Dec 10;25(35):5671-2.  PURPOSE: [facility] (SWOG) trial [id number] demonstrated that adjuvant radiation reduces the risk of biochemical (prostate-specific antigen [PSA]) treatment failure by 50% over radical prostatectomy alone. In this analysis, we stratified patients as to their preradiation PSA levels and correlated it with outcomes such as PSA treatment failure, local recurrence, and distant failure, to serve as guidelines for future research.  PATIENTS AND METHODS: Four hundred thirty-one subjects  with pathologically advanced prostate cancer (extraprostatic extension, positive surgical margins, or seminal vesicle invasion) were randomly assigned to adjuvant radiotherapy or observation.  RESULTS: Three hundred seventy-four eligible patients had immediate postprostatectomy and follow-up PSA data. Median follow-up was 10.2 years. For patients with a postsurgical PSA of 0.2 ng/mL, radiation was associated with reductions in the 10-year risk of biochemical treatment failure (72% to 42%), local failures (20% to 7%), and distant failures (12% to 4%). For patients with a postsurgical PSA between higher than 0.2 and <or = 1.0 ng/mL, reductions in the 10-year risk of biochemical failure (80% to 73%), local failures (25% to 9%), and distant failures (16% to 12%) were realized. In patients with postsurgical PSA higher than 1.0, the respective findings were 94% versus 100%, 28% versus 9%, and 44% versus 18%.  CONCLUSION: The pattern of treatment failure in high-risk patients is predominantly local with a surprisingly low incidence of metastatic failure. Adjuvant radiation to the prostate bed reduces the risk of metastatic disease and biochemical failure at all postsurgical PSA levels. Further improvement in reducing local treatment failure is likely to have the greatest impact on outcome in high-risk patients after prostatectomy.  PMID:  [id number]     J Urol. 2009 Mar;181(3):956-62.  Adjuvant radiotherapy for pathological T3N0M0 prostate cancer significantly reduces risk of metastases and improves survival: long-term followup of a randomized clinical trial.  Grandville Silos IM(1), Tangen CM, Hettie Holstein MS, Ova Freshwater, Messing [facility].  Chief Strategy Officer information: (1)[employer] [facility] [place], [place], [place], [place]. PURPOSE: Extraprostatic disease will be manifest in a third of men after radical prostatectomy. We present the long-term followup of a randomized clinical trial of radiotherapy to reduce the risk of subsequent metastatic disease and death.  MATERIALS AND METHODS: A total of 431 men with pT3N0M0 prostate cancer were randomized to 60 to 64 Gy adjuvant radiotherapy or observation. The primary study end point was metastasis-free survival.  RESULTS: Of 425 eligible men 211 were randomized to observation and 214 to adjuvant radiation. Of those men under observation 70 ultimately received radiotherapy. Metastasis-free survival was significantly greater with radiotherapy (93 of 214 events on the radiotherapy arm vs 114 of 211 events on observation; HR 0.71; 95% CI 0.54, 0.94; p = 0.016). Survival improved significantly with adjuvant radiation (88 deaths of 214 on the radiotherapy arm vs 110 deaths of 211 on observation; HR 0.72; 95% CI 0.55, 0.96; p = 0.023).  CONCLUSIONS: Adjuvant radiotherapy after radical prostatectomy for a man with pT3N0M0 prostate cancer significantly reduces the risk of metastasis and increases survival.  PMCID: [id number] PMID: [id number]

## 2018-09-26 NOTE — Progress Notes (Signed)
See progress note under physician encounter. 

## 2018-09-27 ENCOUNTER — Other Ambulatory Visit: Payer: Self-pay | Admitting: Urology

## 2018-09-27 ENCOUNTER — Telehealth: Payer: Self-pay | Admitting: *Deleted

## 2018-09-27 DIAGNOSIS — C61 Malignant neoplasm of prostate: Secondary | ICD-10-CM

## 2018-09-27 NOTE — Telephone Encounter (Signed)
CALLED PATIENT TO INFORM OF ADT INJ. FOR 10-02-18 - ARRIVAL TIME- 7:45 AM @ DR. MANNY'S OFFICE @ [facility], SPOKE WITH PATIENT AND HE IS AWARE OF THIS APPT.

## 2018-10-02 DIAGNOSIS — C775 Secondary and unspecified malignant neoplasm of intrapelvic lymph nodes: Secondary | ICD-10-CM | POA: Diagnosis not present

## 2018-10-02 DIAGNOSIS — C61 Malignant neoplasm of prostate: Secondary | ICD-10-CM | POA: Diagnosis not present

## 2018-10-02 DIAGNOSIS — N393 Stress incontinence (female) (male): Secondary | ICD-10-CM | POA: Diagnosis not present

## 2018-10-04 ENCOUNTER — Encounter: Payer: Self-pay | Admitting: Urology

## 2018-10-04 NOTE — Progress Notes (Signed)
Per Romie Jumper, patient is scheduled for consult with Dr. Noreene Filbert on 10/05/18.

## 2018-10-05 ENCOUNTER — Ambulatory Visit
Admission: RE | Admit: 2018-10-05 | Discharge: 2018-10-05 | Disposition: A | Discharge status: Home / Self Care | Payer: Commercial Managed Care - HMO | Source: Ambulatory Visit | Attending: Radiation Oncology | Admitting: Radiation Oncology

## 2018-10-05 ENCOUNTER — Encounter: Payer: Self-pay | Admitting: Radiation Oncology

## 2018-10-05 ENCOUNTER — Other Ambulatory Visit: Payer: Self-pay

## 2018-10-05 VITALS — BP 190/125 | HR 106 | Temp 97.2°F | Resp 18 | Wt 209.8 lb

## 2018-10-05 DIAGNOSIS — Z79899 Other long term (current) drug therapy: Secondary | ICD-10-CM | POA: Diagnosis not present

## 2018-10-05 DIAGNOSIS — J449 Chronic obstructive pulmonary disease, unspecified: Secondary | ICD-10-CM | POA: Diagnosis not present

## 2018-10-05 DIAGNOSIS — K5792 Diverticulitis of intestine, part unspecified, without perforation or abscess without bleeding: Secondary | ICD-10-CM | POA: Insufficient documentation

## 2018-10-05 DIAGNOSIS — Z8042 Family history of malignant neoplasm of prostate: Secondary | ICD-10-CM | POA: Insufficient documentation

## 2018-10-05 DIAGNOSIS — C61 Malignant neoplasm of prostate: Secondary | ICD-10-CM | POA: Diagnosis not present

## 2018-10-05 DIAGNOSIS — I10 Essential (primary) hypertension: Secondary | ICD-10-CM | POA: Insufficient documentation

## 2018-10-05 DIAGNOSIS — E785 Hyperlipidemia, unspecified: Secondary | ICD-10-CM | POA: Insufficient documentation

## 2018-10-05 DIAGNOSIS — F419 Anxiety disorder, unspecified: Secondary | ICD-10-CM | POA: Insufficient documentation

## 2018-10-05 DIAGNOSIS — R32 Unspecified urinary incontinence: Secondary | ICD-10-CM | POA: Diagnosis not present

## 2018-10-05 DIAGNOSIS — Z8051 Family history of malignant neoplasm of kidney: Secondary | ICD-10-CM | POA: Insufficient documentation

## 2018-10-05 DIAGNOSIS — Z800 Family history of malignant neoplasm of digestive organs: Secondary | ICD-10-CM | POA: Diagnosis not present

## 2018-10-05 NOTE — Consult Note (Signed)
NEW PATIENT EVALUATION  Name: Gerald Boyer  MRN: [id number]  Date:   10/05/2018     DOB: Feb 12, 1961   This 58 y.o. male patient presents to the clinic for initial evaluation of stage III (T3a N0 M0) Gleason 9 (4+5) adenocarcinoma the prostate status post prostatectomy now with elevated PSA and positive margins.  REFERRING PHYSICIAN: Birdie Sons, MD  CHIEF COMPLAINT:  Chief Complaint  Patient presents with  . Prostate Cancer    Initial consultation of prostate cancer     DIAGNOSIS: The encounter diagnosis was Malignant neoplasm of prostate ([place]).   PREVIOUS INVESTIGATIONS:  PET CT scan CT scans reviewed Pathology reports reviewed Clinical notes reviewed  HPI: patient is a 58 year old male whose history dates back several years when he was noted to have an elevated PSA. Initial biopsies did not confirm malignancy. He went on to have an elevated PSA up to 70.4 which time he had transrectal ultrasound-guided biopsy showing Gleason 7 (4+3) stage TII adenocarcinoma the prostate. Staging MRI as well as CT scans and bone scans were all negative for metastatic disease. He underwent a robotic-assisted prostatectomy. Final pathology showed a Gleason 9 (4+5) with multiple focal positive margins at the bilateral anterior mid to bladder neck base and extraprostatic extension at the bilateral anterior and mid bladder neck areas. He had 9 lymph nodes removed all negative for metastatic disease seminal vesicles were not involved. His PSA in August 2019 was 3.4 has climbed November 2 4.5. Patient underwent repeat prostate PET CT and CT scans showing no evidence of disease.he's been seen by radiation oncology in [facility] with recognition for salvage radiation therapy plus androgen deprivation therapy. He is seen today close to home for evaluation. He does have some slight stress incontinence. No problems with his rectal function. He's having no bone pain at this time.  PLANNED TREATMENT REGIMEN:  salvage radiation therapy along with androgen deprivation therapy  PAST MEDICAL HISTORY:  has a past medical history of Allergy, Anxiety, Cancer ([place]), Diverticulitis, Hyperlipidemia, Hypertension, and Prostate cancer ([place]).    PAST SURGICAL HISTORY:  Past Surgical History:  Procedure Laterality Date  . LIPOMA EXCISION     located on  left shoulder  . LYMPHADENECTOMY Bilateral 12/30/2017   Procedure: LYMPHADENECTOMY;  Surgeon: Alexis Frock, MD;  Location: [facility];  Service: Urology;  Laterality: Bilateral;  . PROSTATE BIOPSY  01/2011   outpatient, Dr. Eliberto Ivory; 05/2011- mild chronic inflammation, no dysplasia  . ROBOT ASSISTED LAPAROSCOPIC RADICAL PROSTATECTOMY N/A 12/30/2017   Procedure: XI ROBOTIC ASSISTED LAPAROSCOPIC RADICAL PROSTATECTOMY WITH PELVIC LYMPHADENECTOMY AND INJECTION OF INDOCYANINE GREEN DYE;  Surgeon: Alexis Frock, MD;  Location: [facility];  Service: Urology;  Laterality: N/A;  . TONSILLECTOMY    . VASECTOMY  1995    FAMILY HISTORY: family history includes COPD in his mother; Hypertension in an other family member; Kidney cancer in his mother; Prostate cancer in his father; Rectal cancer in his daughter.  SOCIAL HISTORY:  reports that he has never smoked. He has never used smokeless tobacco. He reports current alcohol use. He reports that he does not use drugs.  ALLERGIES: Patient has no known allergies.  MEDICATIONS:  Current Outpatient Medications  Medication Sig Dispense Refill  . amLODipine (NORVASC) 2.5 MG tablet TAKE 1 TABLET BY MOUTH EVERY DAY 90 tablet 4  . bicalutamide (CASODEX) 50 MG tablet     . BREO ELLIPTA 200-25 MCG/INH AEPB TAKE 1 PUFF BY MOUTH EVERY DAY**NEEDS OFFICE VISIT** 60 each 4  . clonazePAM (  KLONOPIN) 0.5 MG tablet TAKE 1 TABLET BY MOUTH THREE TIMES A DAY AS NEEDED FOR ANXIETY 60 tablet 1  . fluticasone (FLONASE) 50 MCG/ACT nasal spray Place 2 sprays into both nostrils daily as needed for allergies or rhinitis.    . Loratadine 10 MG CAPS Take 1  tablet by mouth daily as needed (allergies).     . venlafaxine XR (EFFEXOR-XR) 150 MG 24 hr capsule TAKE 1 CAPSULE BY MOUTH EVERY DAY 90 capsule 4  . albuterol (PROVENTIL HFA;VENTOLIN HFA) 108 (90 Base) MCG/ACT inhaler Inhale 2 puffs into the lungs every 6 (six) hours as needed for wheezing or shortness of breath. (Patient not taking: Reported on 12/19/2017) 1 Inhaler 2   No current facility-administered medications for this encounter.     ECOG PERFORMANCE STATUS:  0 - Asymptomatic  REVIEW OF SYSTEMS:  Patient denies any weight loss, fatigue, weakness, fever, chills or night sweats. Patient denies any loss of vision, blurred vision. Patient denies any ringing  of the ears or hearing loss. No irregular heartbeat. Patient denies heart murmur or history of fainting. Patient denies any chest pain or pain radiating to her upper extremities. Patient denies any shortness of breath, difficulty breathing at night, cough or hemoptysis. Patient denies any swelling in the lower legs. Patient denies any nausea vomiting, vomiting of blood, or coffee ground material in the vomitus. Patient denies any stomach pain. Patient states has had normal bowel movements no significant constipation or diarrhea. Patient denies any dysuria, hematuria or significant nocturia. Patient denies any problems walking, swelling in the joints or loss of balance. Patient denies any skin changes, loss of hair or loss of weight. Patient denies any excessive worrying or anxiety or significant depression. Patient denies any problems with insomnia. Patient denies excessive thirst, polyuria, polydipsia. Patient denies any swollen glands, patient denies easy bruising or easy bleeding. Patient denies any recent infections, allergies or URI. Patient "s visual fields have not changed significantly in recent time.    PHYSICAL EXAM: BP (!) 190/125 (BP Location: Left Arm, Patient Position: Sitting)   Pulse (!) 106   Temp (!) 97.2 F (36.2 C)  (Tympanic)   Resp 18   Wt 209 lb 12.3 oz (95.1 kg)   BMI 32.85 kg/m  n rectal exam rectal sphincter tone is good prostatic fossa is clear without evidence of nodularity or mass.Well-developed well-nourished patient in NAD. HEENT reveals PERLA, EOMI, discs not visualized.  Oral cavity is clear. No oral mucosal lesions are identified. Neck is clear without evidence of cervical or supraclavicular adenopathy. Lungs are clear to A&P. Cardiac examination is essentially unremarkable with regular rate and rhythm without murmur rub or thrill. Abdomen is benign with no organomegaly or masses noted. Motor sensory and DTR levels are equal and symmetric in the upper and lower extremities. Cranial nerves II through XII are grossly intact. Proprioception is intact. No peripheral adenopathy or edema is identified. No motor or sensory levels are noted. Crude visual fields are within normal range.  LABORATORY DATA: pathology reports reviewed    RADIOLOGY RESULTS:CT scans PET/CT scans bone scan all reviewed and compatible with the above-stated findings   IMPRESSION: stage III locally advanced adenocarcinoma the prostate in 58 year old male with multiple positive margins after robotic-assisted prostatectomy now with biochemical failure.  PLAN: at this time I recommended salvage radiation therapy. I have run the [facility] nomogram showing greater than 50% chance of potential metastatic disease in his pelvic nodes. I would opt to treat his prostatic fossa as  well as his pelvic nodes. Would treat his prostatic fossa up to 7600 cGy and pelvic lymph nodes up to 5400 cGy using I M RT treatment planning and delivery. Risks and benefits of treatment including increased lower urinary tract symptoms diarrhea fatigue alteration of blood counts and skin reaction all were discussed with the patient. I have personally set up and ordered CT simulation. Patient is scheduled to have his first Lupron injection. Patient copy  has my recognition well.  I would like to take this opportunity to thank you for allowing me to participate in the care of your patient.Noreene Filbert, MD

## 2018-10-17 DIAGNOSIS — Z5111 Encounter for antineoplastic chemotherapy: Secondary | ICD-10-CM | POA: Diagnosis not present

## 2018-10-17 DIAGNOSIS — C61 Malignant neoplasm of prostate: Secondary | ICD-10-CM | POA: Diagnosis not present

## 2018-10-18 ENCOUNTER — Ambulatory Visit
Admission: RE | Admit: 2018-10-18 | Discharge: 2018-10-18 | Disposition: A | Discharge status: Home / Self Care | Payer: 59 | Source: Ambulatory Visit | Attending: Radiation Oncology | Admitting: Radiation Oncology

## 2018-10-18 DIAGNOSIS — C61 Malignant neoplasm of prostate: Secondary | ICD-10-CM | POA: Diagnosis not present

## 2018-10-18 DIAGNOSIS — Z510 Encounter for antineoplastic radiation therapy: Secondary | ICD-10-CM | POA: Insufficient documentation

## 2018-10-26 DIAGNOSIS — Z510 Encounter for antineoplastic radiation therapy: Secondary | ICD-10-CM | POA: Diagnosis not present

## 2018-10-27 ENCOUNTER — Other Ambulatory Visit: Payer: Self-pay | Admitting: *Deleted

## 2018-10-27 DIAGNOSIS — C61 Malignant neoplasm of prostate: Secondary | ICD-10-CM

## 2018-10-30 ENCOUNTER — Ambulatory Visit
Admission: RE | Admit: 2018-10-30 | Discharge: 2018-10-30 | Disposition: A | Discharge status: Home / Self Care | Payer: 59 | Source: Ambulatory Visit | Attending: Radiation Oncology | Admitting: Radiation Oncology

## 2018-10-31 ENCOUNTER — Ambulatory Visit
Admission: RE | Admit: 2018-10-31 | Discharge: 2018-10-31 | Disposition: A | Discharge status: Home / Self Care | Payer: 59 | Source: Ambulatory Visit | Attending: Radiation Oncology | Admitting: Radiation Oncology

## 2018-10-31 DIAGNOSIS — Z510 Encounter for antineoplastic radiation therapy: Secondary | ICD-10-CM | POA: Diagnosis not present

## 2018-10-31 DIAGNOSIS — C61 Malignant neoplasm of prostate: Secondary | ICD-10-CM | POA: Diagnosis not present

## 2018-11-01 ENCOUNTER — Ambulatory Visit
Admission: RE | Admit: 2018-11-01 | Discharge: 2018-11-01 | Disposition: A | Discharge status: Home / Self Care | Payer: 59 | Source: Ambulatory Visit | Attending: Radiation Oncology | Admitting: Radiation Oncology

## 2018-11-01 DIAGNOSIS — Z510 Encounter for antineoplastic radiation therapy: Secondary | ICD-10-CM | POA: Diagnosis not present

## 2018-11-01 DIAGNOSIS — C61 Malignant neoplasm of prostate: Secondary | ICD-10-CM | POA: Diagnosis not present

## 2018-11-02 ENCOUNTER — Ambulatory Visit
Admission: RE | Admit: 2018-11-02 | Discharge: 2018-11-02 | Disposition: A | Discharge status: Home / Self Care | Payer: 59 | Source: Ambulatory Visit | Attending: Radiation Oncology | Admitting: Radiation Oncology

## 2018-11-02 DIAGNOSIS — Z510 Encounter for antineoplastic radiation therapy: Secondary | ICD-10-CM | POA: Diagnosis not present

## 2018-11-02 DIAGNOSIS — E669 Obesity, unspecified: Secondary | ICD-10-CM | POA: Diagnosis not present

## 2018-11-02 DIAGNOSIS — C61 Malignant neoplasm of prostate: Secondary | ICD-10-CM | POA: Diagnosis not present

## 2018-11-02 DIAGNOSIS — I10 Essential (primary) hypertension: Secondary | ICD-10-CM | POA: Diagnosis not present

## 2018-11-03 ENCOUNTER — Ambulatory Visit
Admission: RE | Admit: 2018-11-03 | Discharge: 2018-11-03 | Disposition: A | Discharge status: Home / Self Care | Payer: 59 | Source: Ambulatory Visit | Attending: Radiation Oncology | Admitting: Radiation Oncology

## 2018-11-03 DIAGNOSIS — Z510 Encounter for antineoplastic radiation therapy: Secondary | ICD-10-CM | POA: Diagnosis not present

## 2018-11-03 DIAGNOSIS — C61 Malignant neoplasm of prostate: Secondary | ICD-10-CM | POA: Diagnosis not present

## 2018-11-06 ENCOUNTER — Ambulatory Visit
Admission: RE | Admit: 2018-11-06 | Discharge: 2018-11-06 | Disposition: A | Discharge status: Home / Self Care | Payer: 59 | Source: Ambulatory Visit | Attending: Radiation Oncology | Admitting: Radiation Oncology

## 2018-11-06 DIAGNOSIS — Z510 Encounter for antineoplastic radiation therapy: Secondary | ICD-10-CM | POA: Diagnosis not present

## 2018-11-06 DIAGNOSIS — C61 Malignant neoplasm of prostate: Secondary | ICD-10-CM

## 2018-11-07 ENCOUNTER — Ambulatory Visit
Admission: RE | Admit: 2018-11-07 | Discharge: 2018-11-07 | Disposition: A | Discharge status: Home / Self Care | Payer: 59 | Source: Ambulatory Visit | Attending: Radiation Oncology | Admitting: Radiation Oncology

## 2018-11-07 DIAGNOSIS — C61 Malignant neoplasm of prostate: Secondary | ICD-10-CM | POA: Diagnosis not present

## 2018-11-08 ENCOUNTER — Ambulatory Visit
Admission: RE | Admit: 2018-11-08 | Discharge: 2018-11-08 | Disposition: A | Discharge status: Home / Self Care | Payer: 59 | Source: Ambulatory Visit | Attending: Radiation Oncology | Admitting: Radiation Oncology

## 2018-11-08 DIAGNOSIS — C61 Malignant neoplasm of prostate: Secondary | ICD-10-CM | POA: Diagnosis not present

## 2018-11-09 ENCOUNTER — Ambulatory Visit
Admission: RE | Admit: 2018-11-09 | Discharge: 2018-11-09 | Disposition: A | Discharge status: Home / Self Care | Payer: 59 | Source: Ambulatory Visit | Attending: Radiation Oncology | Admitting: Radiation Oncology

## 2018-11-09 DIAGNOSIS — C61 Malignant neoplasm of prostate: Secondary | ICD-10-CM | POA: Diagnosis not present

## 2018-11-10 ENCOUNTER — Ambulatory Visit
Admission: RE | Admit: 2018-11-10 | Discharge: 2018-11-10 | Disposition: A | Discharge status: Home / Self Care | Payer: 59 | Source: Ambulatory Visit | Attending: Radiation Oncology | Admitting: Radiation Oncology

## 2018-11-10 DIAGNOSIS — C61 Malignant neoplasm of prostate: Secondary | ICD-10-CM | POA: Diagnosis not present

## 2018-11-13 ENCOUNTER — Ambulatory Visit
Admission: RE | Admit: 2018-11-13 | Discharge: 2018-11-13 | Disposition: A | Discharge status: Home / Self Care | Payer: 59 | Source: Ambulatory Visit | Attending: Radiation Oncology | Admitting: Radiation Oncology

## 2018-11-13 DIAGNOSIS — C61 Malignant neoplasm of prostate: Secondary | ICD-10-CM | POA: Diagnosis not present

## 2018-11-14 ENCOUNTER — Ambulatory Visit
Admission: RE | Admit: 2018-11-14 | Discharge: 2018-11-14 | Disposition: A | Discharge status: Home / Self Care | Payer: 59 | Source: Ambulatory Visit | Attending: Radiation Oncology | Admitting: Radiation Oncology

## 2018-11-14 DIAGNOSIS — C61 Malignant neoplasm of prostate: Secondary | ICD-10-CM | POA: Diagnosis not present

## 2018-11-15 ENCOUNTER — Other Ambulatory Visit: Payer: Self-pay

## 2018-11-15 ENCOUNTER — Inpatient Hospital Stay: Payer: 59 | Attending: Radiation Oncology

## 2018-11-15 ENCOUNTER — Ambulatory Visit
Admission: RE | Admit: 2018-11-15 | Discharge: 2018-11-15 | Disposition: A | Discharge status: Home / Self Care | Payer: 59 | Source: Ambulatory Visit | Attending: Radiation Oncology | Admitting: Radiation Oncology

## 2018-11-15 DIAGNOSIS — C61 Malignant neoplasm of prostate: Secondary | ICD-10-CM | POA: Diagnosis not present

## 2018-11-15 DIAGNOSIS — Z510 Encounter for antineoplastic radiation therapy: Secondary | ICD-10-CM | POA: Insufficient documentation

## 2018-11-15 LAB — CBC
HCT: 47.5 % (ref 39.0–52.0)
Hemoglobin: 16.7 g/dL (ref 13.0–17.0)
MCH: 32.9 pg (ref 26.0–34.0)
MCHC: 35.2 g/dL (ref 30.0–36.0)
MCV: 93.5 fL (ref 80.0–100.0)
Platelets: 268 10*3/uL (ref 150–400)
RBC: 5.08 MIL/uL (ref 4.22–5.81)
RDW: 12.4 % (ref 11.5–15.5)
WBC: 6.5 10*3/uL (ref 4.0–10.5)
nRBC: 0 % (ref 0.0–0.2)

## 2018-11-16 ENCOUNTER — Ambulatory Visit
Admission: RE | Admit: 2018-11-16 | Discharge: 2018-11-16 | Disposition: A | Discharge status: Home / Self Care | Payer: 59 | Source: Ambulatory Visit | Attending: Radiation Oncology | Admitting: Radiation Oncology

## 2018-11-16 ENCOUNTER — Other Ambulatory Visit: Payer: Self-pay

## 2018-11-16 DIAGNOSIS — C61 Malignant neoplasm of prostate: Secondary | ICD-10-CM | POA: Diagnosis not present

## 2018-11-17 ENCOUNTER — Other Ambulatory Visit: Payer: Self-pay

## 2018-11-17 ENCOUNTER — Ambulatory Visit
Admission: RE | Admit: 2018-11-17 | Discharge: 2018-11-17 | Disposition: A | Discharge status: Home / Self Care | Payer: 59 | Source: Ambulatory Visit | Attending: Radiation Oncology | Admitting: Radiation Oncology

## 2018-11-17 DIAGNOSIS — C61 Malignant neoplasm of prostate: Secondary | ICD-10-CM | POA: Diagnosis not present

## 2018-11-20 ENCOUNTER — Other Ambulatory Visit: Payer: Self-pay

## 2018-11-20 ENCOUNTER — Ambulatory Visit
Admission: RE | Admit: 2018-11-20 | Discharge: 2018-11-20 | Disposition: A | Discharge status: Home / Self Care | Payer: 59 | Source: Ambulatory Visit | Attending: Radiation Oncology | Admitting: Radiation Oncology

## 2018-11-20 DIAGNOSIS — C61 Malignant neoplasm of prostate: Secondary | ICD-10-CM | POA: Diagnosis not present

## 2018-11-21 ENCOUNTER — Ambulatory Visit
Admission: RE | Admit: 2018-11-21 | Discharge: 2018-11-21 | Disposition: A | Discharge status: Home / Self Care | Payer: 59 | Source: Ambulatory Visit | Attending: Radiation Oncology | Admitting: Radiation Oncology

## 2018-11-21 ENCOUNTER — Other Ambulatory Visit: Payer: Self-pay

## 2018-11-21 DIAGNOSIS — C61 Malignant neoplasm of prostate: Secondary | ICD-10-CM | POA: Diagnosis not present

## 2018-11-22 ENCOUNTER — Ambulatory Visit
Admission: RE | Admit: 2018-11-22 | Discharge: 2018-11-22 | Disposition: A | Discharge status: Home / Self Care | Payer: 59 | Source: Ambulatory Visit | Attending: Radiation Oncology | Admitting: Radiation Oncology

## 2018-11-22 ENCOUNTER — Other Ambulatory Visit: Payer: Self-pay

## 2018-11-22 DIAGNOSIS — C61 Malignant neoplasm of prostate: Secondary | ICD-10-CM | POA: Diagnosis not present

## 2018-11-23 ENCOUNTER — Other Ambulatory Visit: Payer: Self-pay

## 2018-11-23 ENCOUNTER — Ambulatory Visit
Admission: RE | Admit: 2018-11-23 | Discharge: 2018-11-23 | Disposition: A | Discharge status: Home / Self Care | Payer: 59 | Source: Ambulatory Visit | Attending: Radiation Oncology | Admitting: Radiation Oncology

## 2018-11-23 DIAGNOSIS — C61 Malignant neoplasm of prostate: Secondary | ICD-10-CM | POA: Diagnosis not present

## 2018-11-24 ENCOUNTER — Ambulatory Visit
Admission: RE | Admit: 2018-11-24 | Discharge: 2018-11-24 | Disposition: A | Discharge status: Home / Self Care | Payer: 59 | Source: Ambulatory Visit | Attending: Radiation Oncology | Admitting: Radiation Oncology

## 2018-11-24 ENCOUNTER — Other Ambulatory Visit: Payer: Self-pay

## 2018-11-24 DIAGNOSIS — C61 Malignant neoplasm of prostate: Secondary | ICD-10-CM | POA: Diagnosis not present

## 2018-11-27 ENCOUNTER — Other Ambulatory Visit: Payer: Self-pay

## 2018-11-27 ENCOUNTER — Ambulatory Visit
Admission: RE | Admit: 2018-11-27 | Discharge: 2018-11-27 | Disposition: A | Discharge status: Home / Self Care | Payer: 59 | Source: Ambulatory Visit | Attending: Radiation Oncology | Admitting: Radiation Oncology

## 2018-11-27 DIAGNOSIS — C61 Malignant neoplasm of prostate: Secondary | ICD-10-CM | POA: Diagnosis not present

## 2018-11-28 ENCOUNTER — Ambulatory Visit
Admission: RE | Admit: 2018-11-28 | Discharge: 2018-11-28 | Disposition: A | Discharge status: Home / Self Care | Payer: 59 | Source: Ambulatory Visit | Attending: Radiation Oncology | Admitting: Radiation Oncology

## 2018-11-28 ENCOUNTER — Other Ambulatory Visit: Payer: Self-pay

## 2018-11-28 DIAGNOSIS — C61 Malignant neoplasm of prostate: Secondary | ICD-10-CM | POA: Diagnosis not present

## 2018-11-29 ENCOUNTER — Other Ambulatory Visit: Payer: Self-pay

## 2018-11-29 ENCOUNTER — Inpatient Hospital Stay: Payer: 59

## 2018-11-29 ENCOUNTER — Ambulatory Visit
Admission: RE | Admit: 2018-11-29 | Discharge: 2018-11-29 | Disposition: A | Discharge status: Home / Self Care | Payer: 59 | Source: Ambulatory Visit | Attending: Radiation Oncology | Admitting: Radiation Oncology

## 2018-11-29 DIAGNOSIS — C61 Malignant neoplasm of prostate: Secondary | ICD-10-CM

## 2018-11-29 LAB — CBC
HCT: 46.4 % (ref 39.0–52.0)
Hemoglobin: 16.3 g/dL (ref 13.0–17.0)
MCH: 32.6 pg (ref 26.0–34.0)
MCHC: 35.1 g/dL (ref 30.0–36.0)
MCV: 92.8 fL (ref 80.0–100.0)
Platelets: 252 10*3/uL (ref 150–400)
RBC: 5 MIL/uL (ref 4.22–5.81)
RDW: 12.4 % (ref 11.5–15.5)
WBC: 6.1 10*3/uL (ref 4.0–10.5)
nRBC: 0 % (ref 0.0–0.2)

## 2018-11-30 ENCOUNTER — Ambulatory Visit
Admission: RE | Admit: 2018-11-30 | Discharge: 2018-11-30 | Disposition: A | Discharge status: Home / Self Care | Payer: 59 | Source: Ambulatory Visit | Attending: Radiation Oncology | Admitting: Radiation Oncology

## 2018-11-30 ENCOUNTER — Other Ambulatory Visit: Payer: Self-pay

## 2018-11-30 DIAGNOSIS — C61 Malignant neoplasm of prostate: Secondary | ICD-10-CM | POA: Diagnosis not present

## 2018-12-01 ENCOUNTER — Ambulatory Visit
Admission: RE | Admit: 2018-12-01 | Discharge: 2018-12-01 | Disposition: A | Discharge status: Home / Self Care | Payer: 59 | Source: Ambulatory Visit | Attending: Radiation Oncology | Admitting: Radiation Oncology

## 2018-12-01 ENCOUNTER — Other Ambulatory Visit: Payer: Self-pay

## 2018-12-01 DIAGNOSIS — C61 Malignant neoplasm of prostate: Secondary | ICD-10-CM | POA: Diagnosis not present

## 2018-12-04 ENCOUNTER — Ambulatory Visit
Admission: RE | Admit: 2018-12-04 | Discharge: 2018-12-04 | Disposition: A | Discharge status: Home / Self Care | Payer: 59 | Source: Ambulatory Visit | Attending: Radiation Oncology | Admitting: Radiation Oncology

## 2018-12-04 ENCOUNTER — Other Ambulatory Visit: Payer: Self-pay

## 2018-12-04 DIAGNOSIS — C61 Malignant neoplasm of prostate: Secondary | ICD-10-CM | POA: Diagnosis not present

## 2018-12-05 ENCOUNTER — Other Ambulatory Visit: Payer: Self-pay

## 2018-12-05 ENCOUNTER — Ambulatory Visit
Admission: RE | Admit: 2018-12-05 | Discharge: 2018-12-05 | Disposition: A | Discharge status: Home / Self Care | Payer: 59 | Source: Ambulatory Visit | Attending: Radiation Oncology | Admitting: Radiation Oncology

## 2018-12-05 DIAGNOSIS — C61 Malignant neoplasm of prostate: Secondary | ICD-10-CM | POA: Diagnosis not present

## 2018-12-06 ENCOUNTER — Other Ambulatory Visit: Payer: Self-pay

## 2018-12-06 ENCOUNTER — Ambulatory Visit
Admission: RE | Admit: 2018-12-06 | Discharge: 2018-12-06 | Disposition: A | Discharge status: Home / Self Care | Payer: 59 | Source: Ambulatory Visit | Attending: Radiation Oncology | Admitting: Radiation Oncology

## 2018-12-06 DIAGNOSIS — Z510 Encounter for antineoplastic radiation therapy: Secondary | ICD-10-CM

## 2018-12-06 DIAGNOSIS — C61 Malignant neoplasm of prostate: Secondary | ICD-10-CM | POA: Insufficient documentation

## 2018-12-07 ENCOUNTER — Ambulatory Visit
Admission: RE | Admit: 2018-12-07 | Discharge: 2018-12-07 | Disposition: A | Discharge status: Home / Self Care | Payer: 59 | Source: Ambulatory Visit | Attending: Radiation Oncology | Admitting: Radiation Oncology

## 2018-12-07 ENCOUNTER — Other Ambulatory Visit: Payer: Self-pay

## 2018-12-07 DIAGNOSIS — Z510 Encounter for antineoplastic radiation therapy: Secondary | ICD-10-CM | POA: Diagnosis not present

## 2018-12-08 ENCOUNTER — Other Ambulatory Visit: Payer: Self-pay

## 2018-12-08 ENCOUNTER — Ambulatory Visit
Admission: RE | Admit: 2018-12-08 | Discharge: 2018-12-08 | Disposition: A | Discharge status: Home / Self Care | Payer: 59 | Source: Ambulatory Visit | Attending: Radiation Oncology | Admitting: Radiation Oncology

## 2018-12-08 DIAGNOSIS — Z510 Encounter for antineoplastic radiation therapy: Secondary | ICD-10-CM | POA: Diagnosis not present

## 2018-12-11 ENCOUNTER — Ambulatory Visit
Admission: RE | Admit: 2018-12-11 | Discharge: 2018-12-11 | Disposition: A | Discharge status: Home / Self Care | Payer: 59 | Source: Ambulatory Visit | Attending: Radiation Oncology | Admitting: Radiation Oncology

## 2018-12-11 ENCOUNTER — Other Ambulatory Visit: Payer: Self-pay

## 2018-12-11 DIAGNOSIS — Z510 Encounter for antineoplastic radiation therapy: Secondary | ICD-10-CM | POA: Diagnosis not present

## 2018-12-12 ENCOUNTER — Other Ambulatory Visit: Payer: Self-pay

## 2018-12-12 ENCOUNTER — Ambulatory Visit
Admission: RE | Admit: 2018-12-12 | Discharge: 2018-12-12 | Disposition: A | Discharge status: Home / Self Care | Payer: 59 | Source: Ambulatory Visit | Attending: Radiation Oncology | Admitting: Radiation Oncology

## 2018-12-12 DIAGNOSIS — Z510 Encounter for antineoplastic radiation therapy: Secondary | ICD-10-CM | POA: Diagnosis not present

## 2018-12-13 ENCOUNTER — Ambulatory Visit
Admission: RE | Admit: 2018-12-13 | Discharge: 2018-12-13 | Disposition: A | Discharge status: Home / Self Care | Payer: 59 | Source: Ambulatory Visit | Attending: Radiation Oncology | Admitting: Radiation Oncology

## 2018-12-13 ENCOUNTER — Inpatient Hospital Stay: Payer: 59 | Attending: Radiation Oncology

## 2018-12-13 ENCOUNTER — Other Ambulatory Visit: Payer: Self-pay

## 2018-12-13 DIAGNOSIS — C61 Malignant neoplasm of prostate: Secondary | ICD-10-CM

## 2018-12-13 DIAGNOSIS — Z510 Encounter for antineoplastic radiation therapy: Secondary | ICD-10-CM | POA: Diagnosis not present

## 2018-12-13 LAB — CBC
HCT: 43.8 % (ref 39.0–52.0)
Hemoglobin: 15.8 g/dL (ref 13.0–17.0)
MCH: 33 pg (ref 26.0–34.0)
MCHC: 36.1 g/dL — ABNORMAL HIGH (ref 30.0–36.0)
MCV: 91.4 fL (ref 80.0–100.0)
Platelets: 283 10*3/uL (ref 150–400)
RBC: 4.79 MIL/uL (ref 4.22–5.81)
RDW: 12.4 % (ref 11.5–15.5)
WBC: 5.1 10*3/uL (ref 4.0–10.5)
nRBC: 0 % (ref 0.0–0.2)

## 2018-12-14 ENCOUNTER — Other Ambulatory Visit: Payer: Self-pay

## 2018-12-14 ENCOUNTER — Ambulatory Visit
Admission: RE | Admit: 2018-12-14 | Discharge: 2018-12-14 | Disposition: A | Discharge status: Home / Self Care | Payer: 59 | Source: Ambulatory Visit | Attending: Radiation Oncology | Admitting: Radiation Oncology

## 2018-12-14 DIAGNOSIS — Z510 Encounter for antineoplastic radiation therapy: Secondary | ICD-10-CM | POA: Diagnosis not present

## 2018-12-15 ENCOUNTER — Other Ambulatory Visit: Payer: Self-pay

## 2018-12-15 ENCOUNTER — Ambulatory Visit
Admission: RE | Admit: 2018-12-15 | Discharge: 2018-12-15 | Disposition: A | Discharge status: Home / Self Care | Payer: 59 | Source: Ambulatory Visit | Attending: Radiation Oncology | Admitting: Radiation Oncology

## 2018-12-15 DIAGNOSIS — Z510 Encounter for antineoplastic radiation therapy: Secondary | ICD-10-CM | POA: Diagnosis not present

## 2018-12-18 ENCOUNTER — Other Ambulatory Visit: Payer: Self-pay

## 2018-12-18 ENCOUNTER — Ambulatory Visit
Admission: RE | Admit: 2018-12-18 | Discharge: 2018-12-18 | Disposition: A | Discharge status: Home / Self Care | Payer: 59 | Source: Ambulatory Visit | Attending: Radiation Oncology | Admitting: Radiation Oncology

## 2018-12-18 DIAGNOSIS — Z510 Encounter for antineoplastic radiation therapy: Secondary | ICD-10-CM | POA: Diagnosis not present

## 2018-12-19 ENCOUNTER — Ambulatory Visit
Admission: RE | Admit: 2018-12-19 | Discharge: 2018-12-19 | Disposition: A | Discharge status: Home / Self Care | Payer: 59 | Source: Ambulatory Visit | Attending: Radiation Oncology | Admitting: Radiation Oncology

## 2018-12-19 ENCOUNTER — Other Ambulatory Visit: Payer: Self-pay

## 2018-12-19 DIAGNOSIS — Z510 Encounter for antineoplastic radiation therapy: Secondary | ICD-10-CM | POA: Diagnosis not present

## 2018-12-20 ENCOUNTER — Ambulatory Visit
Admission: RE | Admit: 2018-12-20 | Discharge: 2018-12-20 | Disposition: A | Discharge status: Home / Self Care | Payer: 59 | Source: Ambulatory Visit | Attending: Radiation Oncology | Admitting: Radiation Oncology

## 2018-12-20 ENCOUNTER — Other Ambulatory Visit: Payer: Self-pay

## 2018-12-20 DIAGNOSIS — Z510 Encounter for antineoplastic radiation therapy: Secondary | ICD-10-CM | POA: Diagnosis not present

## 2018-12-21 ENCOUNTER — Other Ambulatory Visit: Payer: Self-pay

## 2018-12-21 ENCOUNTER — Ambulatory Visit
Admission: RE | Admit: 2018-12-21 | Discharge: 2018-12-21 | Disposition: A | Discharge status: Home / Self Care | Payer: 59 | Source: Ambulatory Visit | Attending: Radiation Oncology | Admitting: Radiation Oncology

## 2018-12-21 DIAGNOSIS — Z510 Encounter for antineoplastic radiation therapy: Secondary | ICD-10-CM | POA: Diagnosis not present

## 2019-01-24 ENCOUNTER — Ambulatory Visit
Admission: RE | Admit: 2019-01-24 | Discharge: 2019-01-24 | Disposition: A | Discharge status: Home / Self Care | Payer: 59 | Source: Ambulatory Visit | Attending: Radiation Oncology | Admitting: Radiation Oncology

## 2019-01-24 ENCOUNTER — Other Ambulatory Visit: Payer: Self-pay

## 2019-01-24 ENCOUNTER — Other Ambulatory Visit: Payer: Self-pay | Admitting: *Deleted

## 2019-01-24 ENCOUNTER — Encounter: Payer: Self-pay | Admitting: Radiation Oncology

## 2019-01-24 VITALS — BP 157/88 | HR 90 | Temp 97.8°F | Resp 18 | Wt 206.1 lb

## 2019-01-24 DIAGNOSIS — C61 Malignant neoplasm of prostate: Secondary | ICD-10-CM | POA: Diagnosis present

## 2019-01-24 DIAGNOSIS — Z923 Personal history of irradiation: Secondary | ICD-10-CM | POA: Insufficient documentation

## 2019-01-24 NOTE — Progress Notes (Signed)
Radiation Oncology Follow up Note  Name: Gerald Boyer   Date:   01/24/2019 MRN:  [id number] DOB: 04-27-1961    This 58 y.o. male presents to the clinic today for 1 month follow-up status post IMRT radiation therapy to his prostate and pelvic nodes for stage III Gleason 9 adenocarcinoma.  REFERRING PROVIDER: Birdie Sons, MD  HPI: Patient is a 58 year old male now at 1 month having completed IMRT radiation therapy to his prostate and pelvic nodes for Gleason 9 (4+5) adenocarcinoma..  His PSA was up to 70.  He is seen today in routine follow-up he is doing well specifically denies any increased lower urinary tract symptoms or diarrhea.  Recent PSA neurologist office was 0.3.  He continues on androgen deprivation therapy.  COMPLICATIONS OF TREATMENT: none  FOLLOW UP COMPLIANCE: keeps appointments   PHYSICAL EXAM:  BP (!) 157/88 (BP Location: Left Arm, Patient Position: Sitting)   Pulse 90   Temp 97.8 F (36.6 C) (Tympanic)   Resp 18   Wt 206 lb 2.1 oz (93.5 kg)   BMI 32.28 kg/m  Well-developed well-nourished patient in NAD. HEENT reveals PERLA, EOMI, discs not visualized.  Oral cavity is clear. No oral mucosal lesions are identified. Neck is clear without evidence of cervical or supraclavicular adenopathy. Lungs are clear to A&P. Cardiac examination is essentially unremarkable with regular rate and rhythm without murmur rub or thrill. Abdomen is benign with no organomegaly or masses noted. Motor sensory and DTR levels are equal and symmetric in the upper and lower extremities. Cranial nerves II through XII are grossly intact. Proprioception is intact. No peripheral adenopathy or edema is identified. No motor or sensory levels are noted. Crude visual fields are within normal range.  RADIOLOGY RESULTS: No current films for review  PLAN: Present time patient is doing well clinically with no significant residual side effects.  I have asked to see him back in 3 to 4 months for  follow-up with a PSA prior to that visit.  He continues follow-up care with urology and continues androgen deprivation therapy for approximately 2 years.  Patient knows to call with any concerns.  I would like to take this opportunity to thank you for allowing me to participate in the care of your patient.Noreene Filbert, MD

## 2019-01-29 ENCOUNTER — Other Ambulatory Visit: Payer: Self-pay | Admitting: Family Medicine

## 2019-01-29 DIAGNOSIS — J45909 Unspecified asthma, uncomplicated: Secondary | ICD-10-CM

## 2019-02-12 ENCOUNTER — Encounter: Payer: Self-pay | Admitting: *Deleted

## 2019-04-26 ENCOUNTER — Inpatient Hospital Stay: Payer: 59 | Attending: Radiation Oncology

## 2019-04-26 ENCOUNTER — Other Ambulatory Visit: Payer: Self-pay

## 2019-04-26 DIAGNOSIS — C61 Malignant neoplasm of prostate: Secondary | ICD-10-CM

## 2019-04-26 LAB — PSA: Prostatic Specific Antigen: <0.01 ng/mL (ref 0.00–4.00)

## 2019-05-03 ENCOUNTER — Encounter: Payer: Self-pay | Admitting: Radiation Oncology

## 2019-05-03 ENCOUNTER — Ambulatory Visit
Admission: RE | Admit: 2019-05-03 | Discharge: 2019-05-03 | Disposition: A | Discharge status: Home / Self Care | Payer: 59 | Source: Ambulatory Visit | Attending: Radiation Oncology | Admitting: Radiation Oncology

## 2019-05-03 ENCOUNTER — Other Ambulatory Visit: Payer: Self-pay

## 2019-05-03 DIAGNOSIS — C61 Malignant neoplasm of prostate: Secondary | ICD-10-CM | POA: Diagnosis not present

## 2019-05-03 DIAGNOSIS — Z923 Personal history of irradiation: Secondary | ICD-10-CM | POA: Insufficient documentation

## 2019-05-03 NOTE — Progress Notes (Signed)
Radiation Oncology Follow up Note  Name: Gerald Boyer   Date:   05/03/2019 MRN:  [id number] DOB: [birth year]    This 58 y.o. male presents to the clinic today for 71-month follow-up status post IMRT radiation therapy to his prostate and pelvic nodes for stage III Gleason 9 adenocarcinoma.  REFERRING PROVIDER: Birdie Sons, MD  HPI: Patient is a 58 year old male now 4 months out of completed IMRT radiation therapy to his prostate and pelvic nodes for Gleason 9 (4+5) adenocarcinoma.  His PSA was up to 70.  He is seen today in routine follow-up.  He is doing well specifically denies any increased lower urinary tract symptoms diarrhea or fatigue.  His most recent PSA is less than 0.01.  He is currently on androgen deprivation therapy.  COMPLICATIONS OF TREATMENT: none  FOLLOW UP COMPLIANCE: keeps appointments   PHYSICAL EXAM:  BP (P) 139/90 (BP Location: Left Arm, Patient Position: Sitting)   Pulse (P) 85   Temp (!) (P) 97.3 F (36.3 C) (Tympanic)   Resp (P) 18   Wt (P) 198 lb 14.4 oz (90.2 kg)   BMI (P) 31.15 kg/m  Well-developed well-nourished patient in NAD. HEENT reveals PERLA, EOMI, discs not visualized.  Oral cavity is clear. No oral mucosal lesions are identified. Neck is clear without evidence of cervical or supraclavicular adenopathy. Lungs are clear to A&P. Cardiac examination is essentially unremarkable with regular rate and rhythm without murmur rub or thrill. Abdomen is benign with no organomegaly or masses noted. Motor sensory and DTR levels are equal and symmetric in the upper and lower extremities. Cranial nerves II through XII are grossly intact. Proprioception is intact. No peripheral adenopathy or edema is identified. No motor or sensory levels are noted. Crude visual fields are within normal range.  RADIOLOGY RESULTS: No current films to review  PLAN: Present time patient is doing well under excellent biochemical control of his prostate cancer.  He is currently on  androgen deprivation therapy which will continue.  I have asked to see him back in 6 months for follow-up with a PSA at that time.  Patient knows to call at anytime with any concerns.  I would like to take this opportunity to thank you for allowing me to participate in the care of your patient.Noreene Filbert, MD

## 2019-10-19 ENCOUNTER — Other Ambulatory Visit: Payer: Self-pay

## 2019-10-22 ENCOUNTER — Inpatient Hospital Stay: Payer: 59 | Attending: Radiation Oncology

## 2019-10-22 ENCOUNTER — Other Ambulatory Visit: Payer: Self-pay

## 2019-10-22 DIAGNOSIS — C61 Malignant neoplasm of prostate: Secondary | ICD-10-CM | POA: Insufficient documentation

## 2019-10-22 LAB — PSA: Prostatic Specific Antigen: <0.01 ng/mL (ref 0.00–4.00)

## 2019-10-29 ENCOUNTER — Ambulatory Visit: Payer: 59 | Attending: Radiation Oncology | Admitting: Radiation Oncology

## 2020-03-03 IMAGING — PT NM PET NOPR SKULL BASE TO THIGH
7 series · 25 of 25 positions shown · non-contrast
Comparison: Bone scan 11/07/2017, CT 11/28/2017

CLINICAL DATA: Prostate carcinoma with biochemical recurrence.

EXAM:
NUCLEAR MEDICINE PET SKULL BASE TO THIGH
TECHNIQUE: 4.5 mCi F-18 Fluciclovine was injected intravenously. Full-ring PET
imaging was performed from the skull base to thigh after the
radiotracer. CT data was obtained and used for attenuation
correction and anatomic localization.

[Series 3: pet sk_thigh ac · axial · 5.0mm · 4.07mm/px · z∈[+911,+1823]mm · 6 of 229 slices shown]
[im 1/229]
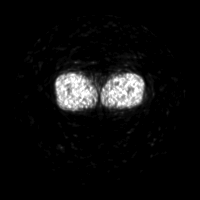
[im 46/229]
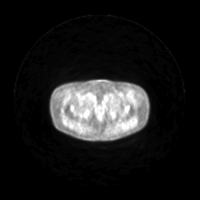
[im 92/229]
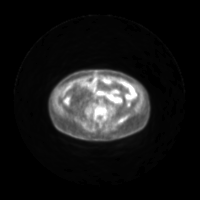
[im 137/229]
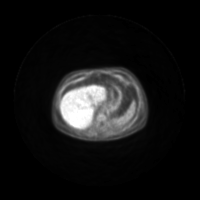
[im 183/229]
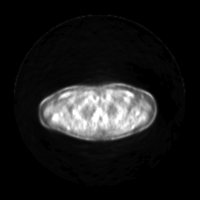
[im 229/229]
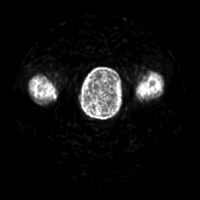

[Series 4: ct sk_thigh 5.0 hd_fov · axial · 5.0mm · 1.17mm/px · z∈[+911,+1823]mm · 5 of 229 slices shown]
[im 1/229]
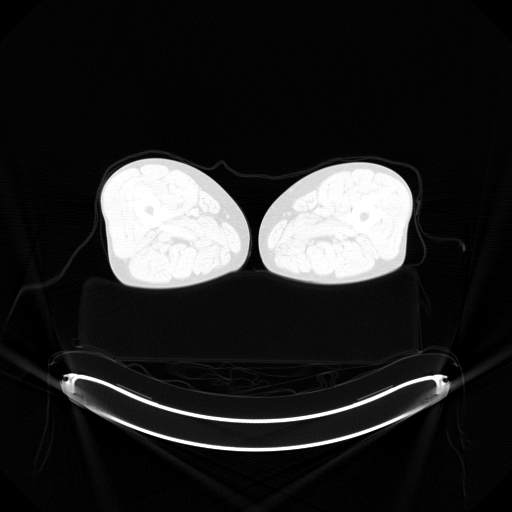
[im 58/229]
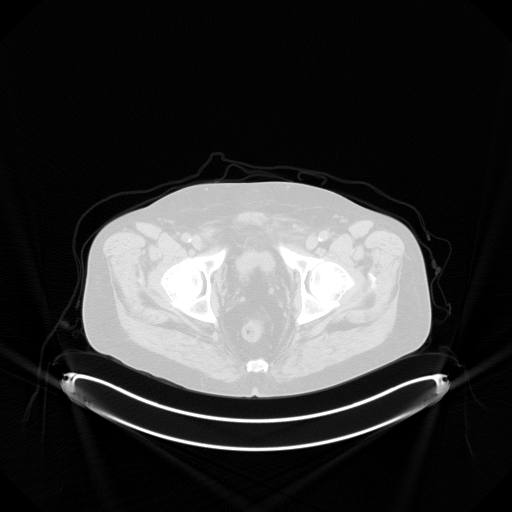
[im 115/229]
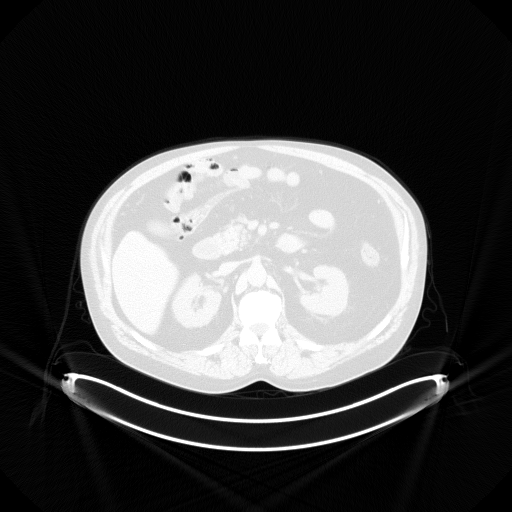
[im 172/229]
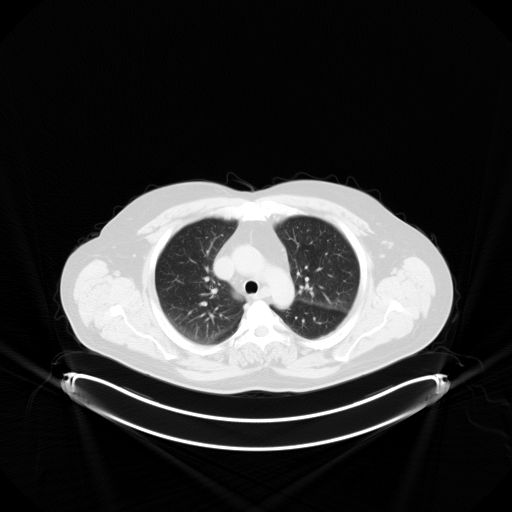
[im 229/229  brain]
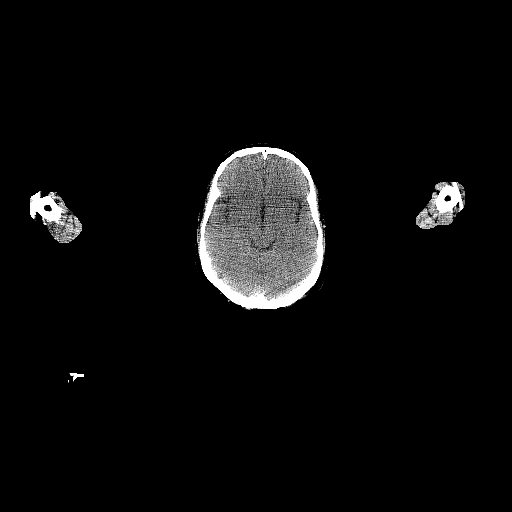

[Series 5: pet sk_thigh nac · axial · 5.0mm · 4.07mm/px · z∈[+911,+1823]mm · 5 of 229 slices shown]
[im 1/229]
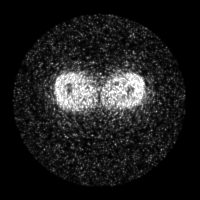
[im 58/229]
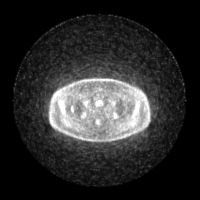
[im 115/229]
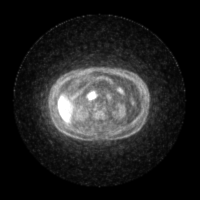
[im 172/229]
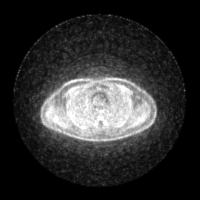
[im 229/229]
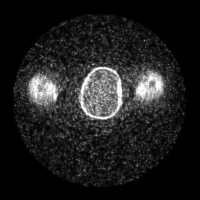

[Series 8: ct sk_thigh 5.0 b70f lung_bone · axial · 5.0mm · 0.75mm/px · z∈[+1389,+1665]mm · 2 of 70 slices shown]
[im 1/70  bone]
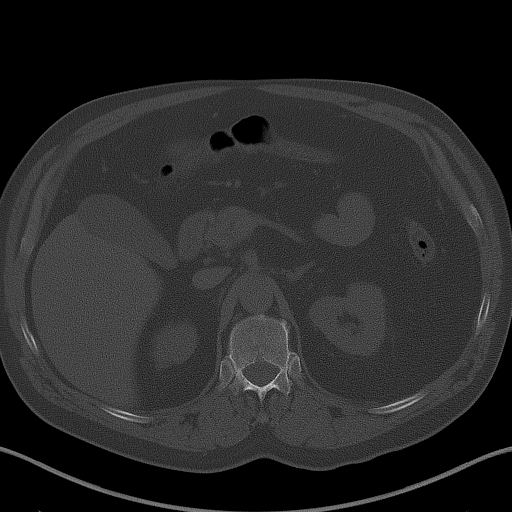
[im 70/70  bone]
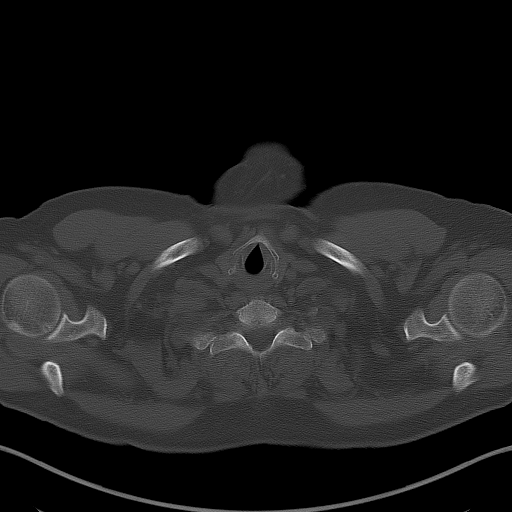

[Series 603: range-ct sk_thigh 5.0 hd_fov-cor-<alpha range> · 1 of 56 slices shown]
[im 1/56]
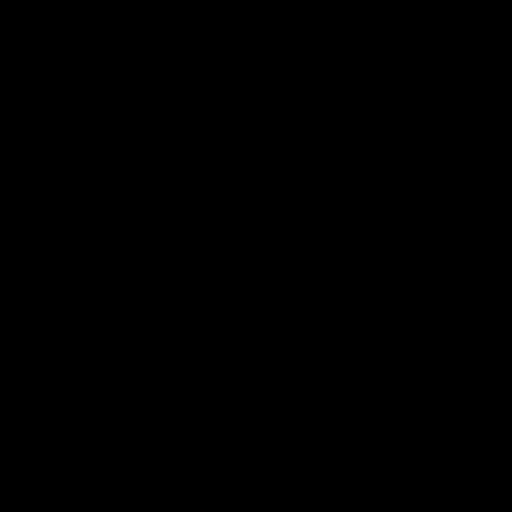

[Series 604: mip range · coronal · 1.89mm/px · 1 of 32 slices shown]
[im 1/32]
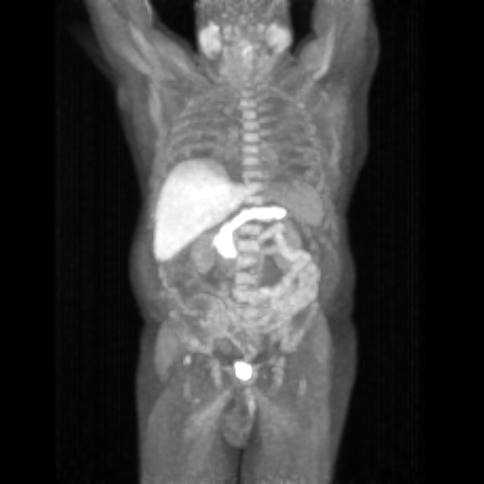

[Series 605: range-ct sk_thigh 5.0 hd_fov-tra-<alpha range> · 5 of 224 slices shown]
[im 1/224]
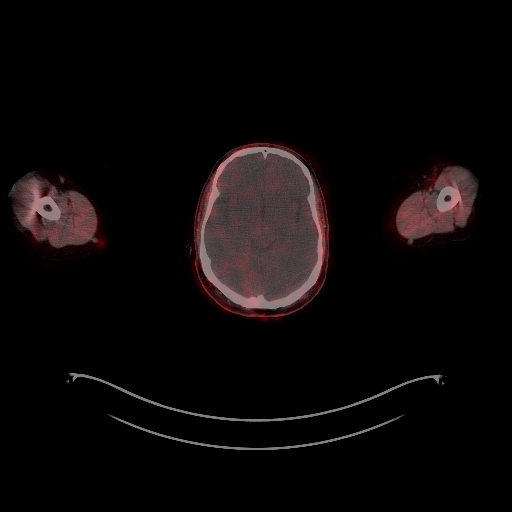
[im 56/224]
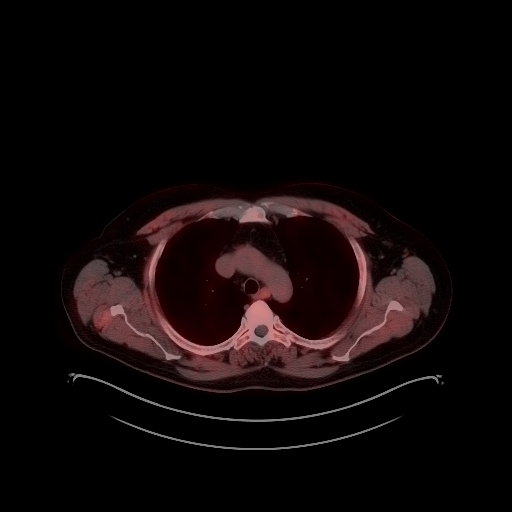
[im 112/224]
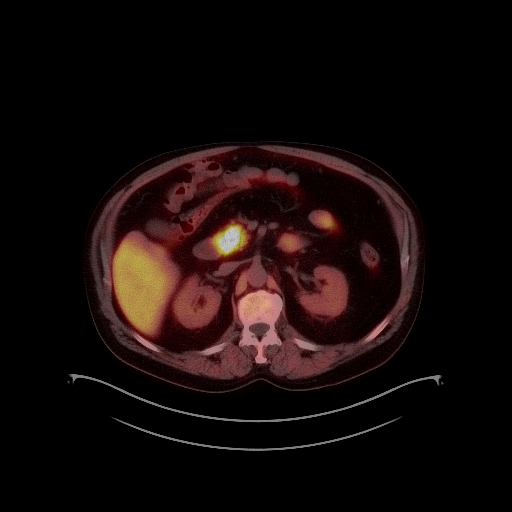
[im 168/224]
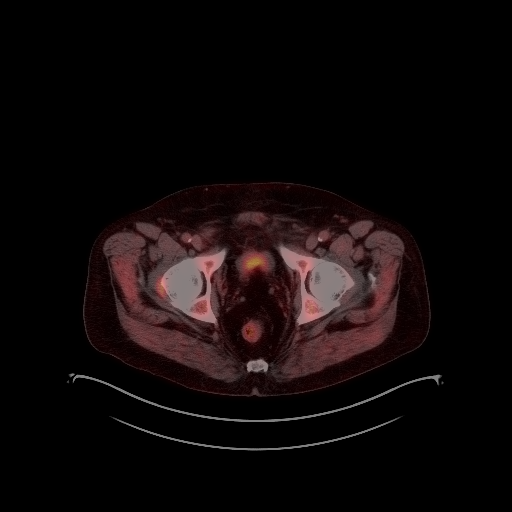
[im 224/224]
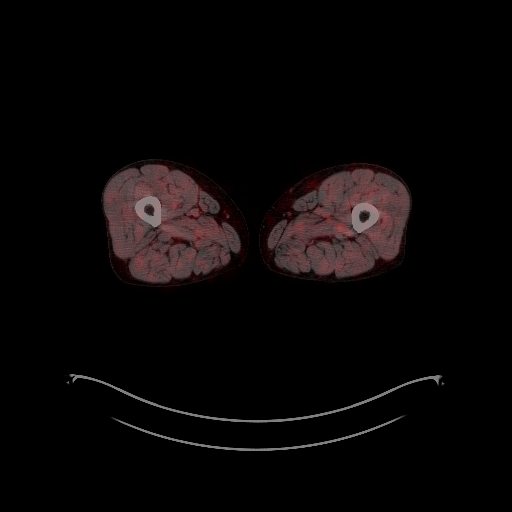

[25 of 25 positions shown; findings below may reference images not displayed]

FINDINGS: NECK

No radiotracer activity in neck lymph nodes.

Incidental CT finding: Sclerotic expansion of the mandibular condyle
on the RIGHT without radiotracer accumulation. Lesion measures 2 cm
x 1.9 cm and corresponds to abnormal uptake on bone scan 11/07/2017.
This may represent fibrous dysplasia or other primary bone lesion.
Lesion appears to expand the mandibular condyle indicating indolent
expansion.

CHEST

No radiotracer accumulation within mediastinal or hilar lymph nodes.
No suspicious pulmonary nodules on the CT scan.

Incidental CT finding: Small 3 mm nodule in the RIGHT middle lobe
not changed from comparison CT

ABDOMEN/PELVIS

Prostate: No focal activity in the prostatectomy bed.

Lymph nodes: No abnormal radiotracer accumulation within pelvic or
abdominal nodes. Mild activity within LEFT inguinal nodes is favored
benign

Liver: No evidence of liver metastasis

Incidental CT finding: None

SKELETON

No focal  activity to suggest skeletal metastasis.
IMPRESSION: 1. No evidence local recurrence within the prostate bed.
2. No metastatic adenopathy pelvis.
3. No evidence distant metastatic disease.
4. Probable benign RIGHT middle lobe pulmonary nodule. Stable from
11/28/2017
5. Expansion of the RIGHT mandibular condyle likely represents
fibrous dysplasia or other primary bone lesion (e.g. osteoma) and
favored benign. Consider correlation with prior dental panogram
films.

## 2021-06-18 ENCOUNTER — Emergency Department
Admission: EM | Admit: 2021-06-18 | Discharge: 2021-06-18 | Disposition: A | Discharge status: Other Acute Inpatient Hospital | Payer: Federal, State, Local not specified - Other | Attending: Emergency Medicine | Admitting: Emergency Medicine

## 2021-06-18 ENCOUNTER — Other Ambulatory Visit: Payer: Self-pay

## 2021-06-18 ENCOUNTER — Encounter (HOSPITAL_COMMUNITY): Payer: Self-pay | Admitting: Emergency Medicine

## 2021-06-18 ENCOUNTER — Inpatient Hospital Stay (HOSPITAL_COMMUNITY)
Admission: AD | Admit: 2021-06-18 | Discharge: 2021-06-29 | DRG: 885 | Disposition: A | Discharge status: Home / Self Care | Payer: Federal, State, Local not specified - Other | Source: Intra-hospital | Attending: Psychiatry | Admitting: Psychiatry

## 2021-06-18 DIAGNOSIS — Z20822 Contact with and (suspected) exposure to covid-19: Secondary | ICD-10-CM | POA: Insufficient documentation

## 2021-06-18 DIAGNOSIS — J449 Chronic obstructive pulmonary disease, unspecified: Secondary | ICD-10-CM | POA: Diagnosis present

## 2021-06-18 DIAGNOSIS — Z8546 Personal history of malignant neoplasm of prostate: Secondary | ICD-10-CM | POA: Insufficient documentation

## 2021-06-18 DIAGNOSIS — Z79899 Other long term (current) drug therapy: Secondary | ICD-10-CM | POA: Diagnosis not present

## 2021-06-18 DIAGNOSIS — F418 Other specified anxiety disorders: Secondary | ICD-10-CM | POA: Diagnosis not present

## 2021-06-18 DIAGNOSIS — F411 Generalized anxiety disorder: Secondary | ICD-10-CM

## 2021-06-18 DIAGNOSIS — F32A Depression, unspecified: Secondary | ICD-10-CM | POA: Insufficient documentation

## 2021-06-18 DIAGNOSIS — K5900 Constipation, unspecified: Secondary | ICD-10-CM | POA: Diagnosis present

## 2021-06-18 DIAGNOSIS — R45851 Suicidal ideations: Secondary | ICD-10-CM | POA: Diagnosis present

## 2021-06-18 DIAGNOSIS — F419 Anxiety disorder, unspecified: Secondary | ICD-10-CM | POA: Insufficient documentation

## 2021-06-18 DIAGNOSIS — I10 Essential (primary) hypertension: Secondary | ICD-10-CM | POA: Diagnosis present

## 2021-06-18 DIAGNOSIS — K30 Functional dyspepsia: Secondary | ICD-10-CM | POA: Diagnosis present

## 2021-06-18 DIAGNOSIS — Z818 Family history of other mental and behavioral disorders: Secondary | ICD-10-CM | POA: Diagnosis not present

## 2021-06-18 DIAGNOSIS — G4700 Insomnia, unspecified: Secondary | ICD-10-CM | POA: Diagnosis present

## 2021-06-18 DIAGNOSIS — F332 Major depressive disorder, recurrent severe without psychotic features: Secondary | ICD-10-CM | POA: Diagnosis present

## 2021-06-18 DIAGNOSIS — F410 Panic disorder [episodic paroxysmal anxiety] without agoraphobia: Secondary | ICD-10-CM

## 2021-06-18 LAB — SALICYLATE LEVEL: Salicylate Lvl: <7 mg/dL — ABNORMAL LOW (ref 7.0–30.0)

## 2021-06-18 LAB — URINE DRUG SCREEN, QUALITATIVE (ARMC ONLY)
Amphetamines, Ur Screen: NOT DETECTED
Barbiturates, Ur Screen: NOT DETECTED
Benzodiazepine, Ur Scrn: POSITIVE — AB
Cannabinoid 50 Ng, Ur ~~LOC~~: NOT DETECTED
Cocaine Metabolite,Ur ~~LOC~~: NOT DETECTED
MDMA (Ecstasy)Ur Screen: POSITIVE — AB
Methadone Scn, Ur: NOT DETECTED
Opiate, Ur Screen: NOT DETECTED
Phencyclidine (PCP) Ur S: NOT DETECTED
Tricyclic, Ur Screen: NOT DETECTED

## 2021-06-18 LAB — CBC
HCT: 52.5 % — ABNORMAL HIGH (ref 39.0–52.0)
Hemoglobin: 18 g/dL — ABNORMAL HIGH (ref 13.0–17.0)
MCH: 30.6 pg (ref 26.0–34.0)
MCHC: 34.3 g/dL (ref 30.0–36.0)
MCV: 89.3 fL (ref 80.0–100.0)
Platelets: 328 10*3/uL (ref 150–400)
RBC: 5.88 MIL/uL — ABNORMAL HIGH (ref 4.22–5.81)
RDW: 12.5 % (ref 11.5–15.5)
WBC: 7.4 10*3/uL (ref 4.0–10.5)
nRBC: 0 % (ref 0.0–0.2)

## 2021-06-18 LAB — COMPREHENSIVE METABOLIC PANEL
ALT: 18 U/L (ref 0–44)
AST: 18 U/L (ref 15–41)
Albumin: 4.1 g/dL (ref 3.5–5.0)
Alkaline Phosphatase: 30 U/L — ABNORMAL LOW (ref 38–126)
Anion gap: 9 (ref 5–15)
BUN: 13 mg/dL (ref 8–23)
CO2: 22 mmol/L (ref 22–32)
Calcium: 9.6 mg/dL (ref 8.9–10.3)
Chloride: 106 mmol/L (ref 98–111)
Creatinine, Ser: 0.94 mg/dL (ref 0.61–1.24)
GFR, Estimated: >60 mL/min (ref >60)
Glucose, Bld: 131 mg/dL — ABNORMAL HIGH (ref 70–99)
Potassium: 4 mmol/L (ref 3.5–5.1)
Sodium: 137 mmol/L (ref 135–145)
Total Bilirubin: 0.9 mg/dL (ref 0.3–1.2)
Total Protein: 7 g/dL (ref 6.5–8.1)

## 2021-06-18 LAB — RESP PANEL BY RT-PCR (FLU A&B, COVID) ARPGX2
Influenza A by PCR: NEGATIVE
Influenza B by PCR: NEGATIVE
SARS Coronavirus 2 by RT PCR: NEGATIVE

## 2021-06-18 LAB — ACETAMINOPHEN LEVEL: Acetaminophen (Tylenol), Serum: <10 ug/mL — ABNORMAL LOW (ref 10–30)

## 2021-06-18 LAB — ETHANOL: Alcohol, Ethyl (B): <10 mg/dL (ref <10)

## 2021-06-18 MED ORDER — FLUTICASONE FUROATE-VILANTEROL 200-25 MCG/INH IN AEPB
1.0000 | INHALATION_SPRAY | Freq: Every day | RESPIRATORY_TRACT | Status: DC
  Filled 2021-06-18: qty 28

## 2021-06-18 MED ORDER — MAGNESIUM HYDROXIDE 400 MG/5ML PO SUSP
30.0000 mL | Freq: Every day | ORAL | Status: DC | PRN
  Administered 2021-06-23: 30 mL via ORAL
  Filled 2021-06-18: qty 30

## 2021-06-18 MED ORDER — ACETAMINOPHEN 325 MG PO TABS: 650.0000 mg | ORAL_TABLET | Freq: Four times a day (QID) | ORAL | Status: DC | PRN

## 2021-06-18 MED ORDER — HYDROXYZINE HCL 50 MG PO TABS
50.0000 mg | ORAL_TABLET | Freq: Three times a day (TID) | ORAL | Status: DC | PRN
  Administered 2021-06-18 – 2021-06-19 (×3): 50 mg via ORAL
  Filled 2021-06-18 (×3): qty 1

## 2021-06-18 MED ORDER — TRAZODONE HCL 100 MG PO TABS
100.0000 mg | ORAL_TABLET | Freq: Every evening | ORAL | Status: DC | PRN
  Administered 2021-06-18 – 2021-06-28 (×11): 100 mg via ORAL
  Filled 2021-06-18 (×7): qty 1
  Filled 2021-06-18: qty 7
  Filled 2021-06-18 (×4): qty 1

## 2021-06-18 MED ORDER — ALUM & MAG HYDROXIDE-SIMETH 200-200-20 MG/5ML PO SUSP: 30.0000 mL | ORAL | Status: DC | PRN

## 2021-06-18 MED ORDER — SIMVASTATIN 10 MG PO TABS: 20.0000 mg | ORAL_TABLET | Freq: Every day | ORAL | Status: DC

## 2021-06-18 MED ORDER — BENAZEPRIL HCL 20 MG PO TABS
20.0000 mg | ORAL_TABLET | Freq: Every day | ORAL | Status: DC
  Filled 2021-06-18: qty 1

## 2021-06-18 MED ORDER — AMLODIPINE BESYLATE 5 MG PO TABS: 10.0000 mg | ORAL_TABLET | Freq: Every day | ORAL | Status: DC

## 2021-06-18 MED ORDER — AMLODIPINE BESYLATE 10 MG PO TABS
10.0000 mg | ORAL_TABLET | Freq: Every day | ORAL | Status: DC
  Administered 2021-06-19 – 2021-06-29 (×11): 10 mg via ORAL
  Filled 2021-06-18 (×15): qty 1

## 2021-06-18 MED ORDER — CLONAZEPAM 0.5 MG PO TABS: 0.5000 mg | ORAL_TABLET | Freq: Two times a day (BID) | ORAL | Status: DC | PRN

## 2021-06-18 MED ORDER — SIMVASTATIN 20 MG PO TABS
20.0000 mg | ORAL_TABLET | Freq: Every day | ORAL | Status: DC
  Administered 2021-06-19 – 2021-06-21 (×3): 20 mg via ORAL
  Filled 2021-06-18 (×5): qty 1

## 2021-06-18 MED ORDER — BENAZEPRIL HCL 10 MG PO TABS
20.0000 mg | ORAL_TABLET | Freq: Every day | ORAL | Status: DC
  Administered 2021-06-19 – 2021-06-29 (×11): 20 mg via ORAL
  Filled 2021-06-18 (×10): qty 1
  Filled 2021-06-18: qty 2
  Filled 2021-06-18: qty 1
  Filled 2021-06-18: qty 2
  Filled 2021-06-18: qty 1

## 2021-06-18 MED ORDER — LORAZEPAM 1 MG PO TABS
1.0000 mg | ORAL_TABLET | Freq: Once | ORAL | Status: AC
  Administered 2021-06-18: 1 mg via ORAL
  Filled 2021-06-18: qty 1

## 2021-06-18 NOTE — Progress Notes (Signed)
Patient ID: Gerald Boyer, male   DOB: 17-Nov-1960, 60 y.o.   MRN: [id number] Admission Note  Pt is a 60 yo male that presents voluntarily on 06/18/2021 with worsening anxiety, depression, panic attacks, poor medication management, and job loss that has led to suicidal ideations. Pt states they had prostate cancer in 2022 and eventually had to leave their company in June of 2022. Pt lost their insurance and they were unable to continue outpatient services with their pcp and psych provider. Pt states they have tried multiple medications but failed them. Pt states they are struggling financially and this has put strain on their relationship. Pt lives with their girlfriend and spouses 71 yo son. Pt's girlfriend is employed. Pt states they have had difficulty doing everyday activities because of the worrying and panic attacks. Pt is also incontinent after surgery. Pt denies past/present verbal/physical/sexual abuse. Pt denies current tobacco/drug/Rx abuse. Pt states they drink "a couple" beers/day but never more than 6. Pt did test positive for benzos and MDMA but pt states they are Rx'd benzodiazepines. Pt denies every having a suicide plan. Pt denies current si/hi/ah/vh and verbally agrees to approach staff before harming self/others while at [facility] signed, handbook detailing the patient's rights, responsibilities, and visitor guidelines provided. Skin/belongings search completed and patient oriented to unit. Patient stable at this time. Patient given the opportunity to express concerns and ask questions. Patient given toiletries. Will continue to monitor.   [facility] Consult Note 10/13:  Patient was seen and chart reviewed. Patient is tearful throughout interview. He describes a long-standing history of depression and anxiety. States that it has gotten worse over the last 1.5 years, and increasing the last 2-3 months, along with decreased concentration and forgetfulness. Dr. Nicolasa Ducking had prescribed Zoloft,  Wellbutrin, Adderall. Nothing has helped depression or anxiety and Adderall gave him Tics. He had some sessions with a therapist at [facility] until his insurance ran out. Patient had chemotherapy for prostate cancerin 2019, and states he believes that has made his symptoms worse. 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 He stopped working in Nov 2021 and lost his insurance and short-term disability.  Patient states he gave it To [facility] but never went in for an assessment because he was "scared."  He also went to guardian and someone was supposed to come to his house today but never showed up.  That is when he decided to come to the ED.   Patient endorses SI without a plan. States that he is hopeless and constantly worried. Crying much of the time.  He denies homicidal ideation, hallucinations, or paranoia.  Sleep is disrupted, with either sleeping too much or not enough.  Appetite is poor.  Patient endorses hopelessness and is agreeable to hospitalization for treatment and stabilization.

## 2021-06-18 NOTE — ED Notes (Addendum)
Pt has periods of forgetfulness and cannot concentrate.   Partner would like to be contacted, Olga Millers and number is in the chart.

## 2021-06-18 NOTE — Progress Notes (Signed)
Psychoeducational Group Note  Date:  06/18/2021 Time:  2056  Group Topic/Focus:  Wrap-Up Group:   The focus of this group is to help patients review their daily goal of treatment and discuss progress on daily workbooks.  Participation Level: Did Not Attend  Participation Quality:  Not Applicable  Affect:  Not Applicable  Cognitive:  Not Applicable  Insight:  Not Applicable  Engagement in Group: Not Applicable  Additional Comments:  The patient did not attend group this evening.   Archie Balboa S 06/18/2021, 8:56 PM

## 2021-06-18 NOTE — ED Triage Notes (Addendum)
Pt here with suicidal thoughts. Pt states that he has severe anxiety, panic attacks,and depression. Pt denies having a plan to hurt himself but he does think about it a lot. Pt is here with his partner. Pt in NAD in triage.  Pt has stage 3 CA in 2019 and thinks all of this may be a side effect from the chemotherapy.

## 2021-06-18 NOTE — ED Notes (Addendum)
Pt belongings:  1 green shirt 1 pair of jeans 1 pair of brown boots 1 brown belt 1 pair of brown socks 1 pair of gray boxers 1 blue bookbag

## 2021-06-18 NOTE — Tx Team (Signed)
Initial Treatment Plan 06/18/2021 5:40 PM Dennie Maizes EHO:[id number]    PATIENT STRESSORS: Financial difficulties   Health problems   Medication change or noncompliance   Occupational concerns     PATIENT STRENGTHS: Ability for insight  Average or above average intelligence  Capable of independent living  Motivation for treatment/growth  Supportive family/friends  Work skills    PATIENT IDENTIFIED PROBLEMS: anxiety  depression  Suicidal ideations                 DISCHARGE CRITERIA:  Ability to meet basic life and health needs Improved stabilization in mood, thinking, and/or behavior Motivation to continue treatment in a less acute level of care Need for constant or close observation no longer present  PRELIMINARY DISCHARGE PLAN: Attend aftercare/continuing care group Outpatient therapy Participate in family therapy Return to previous living arrangement  PATIENT/FAMILY INVOLVEMENT: This treatment plan has been presented to and reviewed with the patient, Gerald Boyer.  The patient and family have been given the opportunity to ask questions and make suggestions.  Baron Sane, RN 06/18/2021, 5:40 PM

## 2021-06-18 NOTE — Consult Note (Signed)
[facility] Consult   Reason for Consult:  suicidal ideation Referring Physician:  Charna Archer Patient Identification: Gerald Boyer MRN:  [id number] Principal Diagnosis: <principal problem not specified> Diagnosis:  Active Problems:   * No active hospital problems. *   Total Time spent with patient: 1 hour  Subjective:  "I just can't take this anymore; I need help" Gerald Boyer is a 60 y.o. male patient admitted with suicidal ideation.Gerald Boyer  HPI:  Patient was seen and chart reviewed. Patient is tearful throughout interview. He describes a long-standing history of depression and anxiety. States that it has gotten worse over the last 1.5 years, and increasing the last 2-3 months, along with decreased concentration and forgetfulness. Dr. Nicolasa Ducking had prescribed Zoloft, Wellbutrin, Adderall. Nothing has helped depression or anxiety and Adderall gave him Tics. He had some sessions with a therapist at [facility] until his insurance ran out. Patient had chemotherapy for prostate cancerin 2019, and states he believes that has made his symptoms worse. 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 He stopped working in Nov 2021 and lost his insurance and short-term disability.  Patient states he gave it To [facility] but never went in for an assessment because he was "scared."  He also went to guardian and someone was supposed to come to his house today but never showed up.  That is when he decided to come to the ED.  Patient endorses SI without a plan. States that he is hopeless and constantly worried. Crying much of the time.  He denies homicidal ideation, hallucinations, or paranoia.  Sleep is disrupted, with either sleeping too much or not enough.  Appetite is poor.  Patient endorses hopelessness and is agreeable to hospitalization for treatment and stabilization.   Past Psychiatric History: History of depression and anxiety.   No prior hospitalizations.  Medications tried are Effexor, Prozac, Adderall, Rexulti, sertraline.  Rexulti was effective but caused prohibited.  Patient states he was on Effexor many years ago and it worked but then stopped working.  Risk to Self:   Risk to Others:   Prior Inpatient Therapy:   Prior Outpatient Therapy:    Past Medical History:  Past Medical History:  Diagnosis Date   Allergy    Anxiety    Cancer ([place])    Diverticulitis    Hyperlipidemia    Hypertension    Prostate cancer [facility])     Past Surgical History:  Procedure Laterality Date   LIPOMA EXCISION     located on  left shoulder   LYMPHADENECTOMY Bilateral 12/30/2017   Procedure: LYMPHADENECTOMY;  Surgeon: Alexis Frock, MD;  Location: [facility];  Service: Urology;  Laterality: Bilateral;   PROSTATE BIOPSY  01/2011   outpatient, Dr. Eliberto Ivory; 05/2011- mild chronic inflammation, no dysplasia   ROBOT ASSISTED LAPAROSCOPIC RADICAL PROSTATECTOMY N/A 12/30/2017   Procedure: XI ROBOTIC ASSISTED LAPAROSCOPIC RADICAL PROSTATECTOMY WITH PELVIC LYMPHADENECTOMY AND INJECTION OF INDOCYANINE GREEN DYE;  Surgeon: Alexis Frock, MD;  Location: [facility];  Service: Urology;  Laterality: N/A;   TONSILLECTOMY     VASECTOMY  1995   Family History:  Family History  Problem Relation Age of Onset   COPD Mother    Kidney cancer Mother    Prostate cancer Father        treated with radiation   Rectal cancer Daughter    Hypertension Other    Breast cancer Neg Hx    Colon cancer  Neg Hx    Pancreatic cancer Neg Hx    Family Psychiatric  History: Mother and Maternal grandmother have depression.  Grandmother had ECT.  Great uncle completed suicide. Social History:  Social History   Substance and Sexual Activity  Alcohol Use Yes   Alcohol/week: 0.0 standard drinks   Comment: occasional use     Social History   Substance and Sexual Activity  Drug Use No    Social History   Socioeconomic History   Marital status: Single    Spouse  name: IT sales professional   Number of children: 3   Years of education: Not on file   Highest education level: Not on file  Occupational History   Occupation: Inside sales  Tobacco Use   Smoking status: Never   Smokeless tobacco: Never  Vaping Use   Vaping Use: Never used  Substance and Sexual Activity   Alcohol use: Yes    Alcohol/week: 0.0 standard drinks    Comment: occasional use   Drug use: No   Sexual activity: Not Currently  Other Topics Concern   Not on file  Social History Narrative   Divorced. Has two daughters and one son. Resides in [place] with significant other Gerald Boyer.   Social Determinants of Health   Financial Resource Strain: Not on file  Food Insecurity: Not on file  Transportation Needs: Not on file  Physical Activity: Not on file  Stress: Not on file  Social Connections: Not on file   Additional Social History:    Allergies:  No Known Allergies  Labs:  Results for orders placed or performed during the hospital encounter of 06/18/21 (from the past 48 hour(s))  Comprehensive metabolic panel     Status: Abnormal   Collection Time: 06/18/21 12:39 PM  Result Value Ref Range   Sodium 137 135 - 145 mmol/L   Potassium 4.0 3.5 - 5.1 mmol/L   Chloride 106 98 - 111 mmol/L   CO2 22 22 - 32 mmol/L   Glucose, Bld 131 (H) 70 - 99 mg/dL    Comment: Glucose reference range applies only to samples taken after fasting for at least 8 hours.   BUN 13 8 - 23 mg/dL   Creatinine, Ser 0.94 0.61 - 1.24 mg/dL   Calcium 9.6 8.9 - 10.3 mg/dL   Total Protein 7.0 6.5 - 8.1 g/dL   Albumin 4.1 3.5 - 5.0 g/dL   AST 18 15 - 41 U/L   ALT 18 0 - 44 U/L   Alkaline Phosphatase 30 (L) 38 - 126 U/L   Total Bilirubin 0.9 0.3 - 1.2 mg/dL   GFR, Estimated >60 >60 mL/min    Comment: (NOTE) Calculated using the CKD-EPI Creatinine Equation (2021)    Anion gap 9 5 - 15    Comment: Performed at [facility], [address] [place], [place] [address]  Ethanol     Status: None    Collection Time: 06/18/21 12:39 PM  Result Value Ref Range   Alcohol, Ethyl (B) <10 <10 mg/dL    Comment: (NOTE) Lowest detectable limit for serum alcohol is 10 mg/dL.  For medical purposes only. Performed at [facility], [address] [place], [place] [address]   Salicylate level     Status: Abnormal   Collection Time: 06/18/21 12:39 PM  Result Value Ref Range   Salicylate Lvl <3.2 (L) 7.0 - 30.0 mg/dL    Comment: Performed at [facility], [address], [place], [place] [address]  Acetaminophen  level     Status: Abnormal   Collection Time: 06/18/21 12:39 PM  Result Value Ref Range   Acetaminophen (Tylenol), Serum <10 (L) 10 - 30 ug/mL    Comment: (NOTE) Therapeutic concentrations vary significantly. A range of 10-30 ug/mL  may be an effective concentration for many patients. However, some  are best treated at concentrations outside of this range. Acetaminophen concentrations >150 ug/mL at 4 hours after ingestion  and >50 ug/mL at 12 hours after ingestion are often associated with  toxic reactions.  Performed at [facility], [address] [place], [place] [address]   cbc     Status: Abnormal   Collection Time: 06/18/21 12:39 PM  Result Value Ref Range   WBC 7.4 4.0 - 10.5 K/uL   RBC 5.88 (H) 4.22 - 5.81 MIL/uL   Hemoglobin 18.0 (H) 13.0 - 17.0 g/dL   HCT 52.5 (H) 39.0 - 52.0 %   MCV 89.3 80.0 - 100.0 fL   MCH 30.6 26.0 - 34.0 pg   MCHC 34.3 30.0 - 36.0 g/dL   RDW 12.5 11.5 - 15.5 %   Platelets 328 150 - 400 K/uL   nRBC 0.0 0.0 - 0.2 %    Comment: Performed at [facility], [address], [place], [place] [address]  Urine Drug Screen, Qualitative     Status: Abnormal   Collection Time: 06/18/21 12:39 PM  Result Value Ref Range   Tricyclic, Ur Screen NONE DETECTED NONE DETECTED   Amphetamines, Ur Screen NONE DETECTED NONE DETECTED   MDMA (Ecstasy)Ur Screen POSITIVE (A) NONE DETECTED   Cocaine Metabolite,Ur [place] NONE  DETECTED NONE DETECTED   Opiate, Ur Screen NONE DETECTED NONE DETECTED   Phencyclidine (PCP) Ur S NONE DETECTED NONE DETECTED   Cannabinoid 50 Ng, Ur [place] NONE DETECTED NONE DETECTED   Barbiturates, Ur Screen NONE DETECTED NONE DETECTED   Benzodiazepine, Ur Scrn POSITIVE (A) NONE DETECTED   Methadone Scn, Ur NONE DETECTED NONE DETECTED    Comment: (NOTE) Tricyclics + metabolites, urine    Cutoff 1000 ng/mL Amphetamines + metabolites, urine  Cutoff 1000 ng/mL MDMA (Ecstasy), urine              Cutoff 500 ng/mL Cocaine Metabolite, urine          Cutoff 300 ng/mL Opiate + metabolites, urine        Cutoff 300 ng/mL Phencyclidine (PCP), urine         Cutoff 25 ng/mL Cannabinoid, urine                 Cutoff 50 ng/mL Barbiturates + metabolites, urine  Cutoff 200 ng/mL Benzodiazepine, urine              Cutoff 200 ng/mL Methadone, urine                   Cutoff 300 ng/mL  The urine drug screen provides only a preliminary, unconfirmed analytical test result and should not be used for non-medical purposes. Clinical consideration and professional judgment should be applied to any positive drug screen result due to possible interfering substances. A more specific alternate chemical method must be used in order to obtain a confirmed analytical result. Gas chromatography / mass spectrometry (GC/MS) is the preferred confirm atory method. Performed at [facility], [address] [place], [place] [address]     Current Facility-Administered Medications  Medication Dose Route Frequency Provider Last Rate Last Admin   amLODipine (NORVASC) tablet 10 mg  10 mg Oral Daily Blake Divine, MD       And   benazepril (LOTENSIN) tablet 20 mg  20 mg Oral Daily Blake Divine, MD       clonazePAM Bobbye [place]) tablet 0.5 mg  0.5 mg Oral BID PRN Blake Divine, MD       fluticasone furoate-vilanterol (BREO ELLIPTA) 200-25 MCG/INH 1 puff  1 puff Inhalation Daily Blake Divine, MD        simvastatin (ZOCOR) tablet 20 mg  20 mg Oral q1800 Blake Divine, MD       Current Outpatient Medications  Medication Sig Dispense Refill   albuterol (PROVENTIL HFA;VENTOLIN HFA) 108 (90 Base) MCG/ACT inhaler Inhale 2 puffs into the lungs every 6 (six) hours as needed for wheezing or shortness of breath. (Patient not taking: Reported on 01/24/2019) 1 Inhaler 2   amLODipine-benazepril (LOTREL) 10-20 MG capsule TAKE 1 CAPSULE BY MOUTH EVERY DAY IN THE MORNING     bicalutamide (CASODEX) 50 MG tablet      BREO ELLIPTA 200-25 MCG/INH AEPB TAKE 1 PUFF BY MOUTH EVERY DAY**NEEDS OFFICE VISIT** 60 each 4   clonazePAM (KLONOPIN) 0.5 MG tablet TAKE 1 TABLET BY MOUTH THREE TIMES A DAY AS NEEDED FOR ANXIETY 60 tablet 1   fluticasone (FLONASE) 50 MCG/ACT nasal spray Place 2 sprays into both nostrils daily as needed for allergies or rhinitis.     Loratadine 10 MG CAPS Take 1 tablet by mouth daily as needed (allergies).      simvastatin (ZOCOR) 20 MG tablet Take 20 mg by mouth daily.     venlafaxine XR (EFFEXOR-XR) 150 MG 24 hr capsule TAKE 1 CAPSULE BY MOUTH EVERY DAY 90 capsule 4    Musculoskeletal: Strength & Muscle Tone: within normal limits Gait & Station: normal Patient leans: N/A Psychiatric Specialty Exam:  Presentation  General Appearance:  Appropriate for Environment Eye Contact: Good Speech: Clear and Coherent Speech Volume: Normal Handedness: No data recorded  Mood and Affect  Mood: Anxious; Depressed; Hopeless Affect: Depressed; Congruent; Tearful  Thought Process  Thought Processes: Coherent Descriptions of Associations:Intact Orientation:Full (Time, Place and Person) Thought Content:Logical History of Schizophrenia/Schizoaffective disorder:No data recorded Duration of Psychotic Symptoms:No data recorded Hallucinations:Hallucinations: None Ideas of Reference:None Suicidal Thoughts:Suicidal Thoughts: Yes, Passive SI Passive Intent and/or Plan: Without Intent; Without  Plan Homicidal Thoughts:Homicidal Thoughts: No  Sensorium  Memory: Immediate Good Judgment: Good Insight: Good  Executive Functions  Concentration: No data recorded Attention Span: Good Recall: Good Fund of Knowledge: Good Language: Good  Psychomotor Activity  Psychomotor Activity: Psychomotor Activity: Normal  Assets  Assets: Communication Skills; Desire for Improvement; Housing; Intimacy; Resilience; Social Support  Sleep  Sleep: Sleep: Poor  Physical Exam: Physical Exam Vitals and nursing note reviewed.  HENT:     Head: Normocephalic.     Nose: No congestion or rhinorrhea.  Eyes:     General:        Right eye: No discharge.        Left eye: No discharge.  Cardiovascular:     Rate and Rhythm: Normal rate.  Pulmonary:     Effort: Pulmonary effort is normal.  Musculoskeletal:        General: Normal range of motion.     Cervical back: Normal range of motion.  Skin:    General: Skin is dry.  Neurological:     Mental Status: He is alert and oriented to person, place, and time.  Psychiatric:        Attention and Perception: Attention  normal.        Mood and Affect: Mood is anxious and depressed.        Speech: Speech normal.        Behavior: Behavior is cooperative.        Thought Content: Thought content normal.        Cognition and Memory: Cognition normal.        Judgment: Judgment normal.   Review of Systems  Psychiatric/Behavioral:  Positive for depression and suicidal ideas. Negative for hallucinations, memory loss and substance abuse. The patient is nervous/anxious and has insomnia.   All other systems reviewed and are negative. Blood pressure 128/66, pulse 81, temperature 98 F (36.7 C), temperature source Oral, resp. rate 18, height 5\' 8"  (1.727 m), weight 83.9 kg, SpO2 100 %. Body mass index is 28.13 kg/m.  Treatment Plan Summary: Plan 60 year old male with a history of depression and anxiety presents with suicidal ideation.  Patient has  had a trial of several different medications without adequate effect.  Plan is to admit patient for treatment, medication management and stabilization. Discussed with Dr. Weber Cooks and emergency room physician  Disposition: Recommend psychiatric Inpatient admission when medically cleared.  Sherlon Handing, NP 06/18/2021 1:50 PM

## 2021-06-18 NOTE — ED Provider Notes (Signed)
[facility] Emergency Department Provider Note   ____________________________________________   Event Date/Time   First MD Initiated Contact with Patient 06/18/21 1251     (approximate)  I have reviewed the triage vital signs and the nursing notes.   HISTORY  Chief Complaint Suicidal    HPI Gerald Boyer is a 60 y.o. male with past medical history of hypertension, hyperlipidemia, anxiety, and prostate cancer presents to the ED complaining of suicidal ideation.  Patient reports he has been feeling increasingly depressed and anxious for the past couple of months, has been having panic attacks on a regular basis.  He states he has begun having thoughts of suicide, but has not thought about anything specific that he might do to harm himself.  He has been on antidepressants in the past but states none of them have helped him.  He is currently taking Xanax as needed for his anxiety use, but states this has not been helping either.  He reports drinking a couple of beers daily, denies drug use.  He denies any medical complaints at this time.        Past Medical History:  Diagnosis Date   Allergy    Anxiety    Cancer ([place])    Diverticulitis    Hyperlipidemia    Hypertension    Prostate cancer [facility])     Patient Active Problem List   Diagnosis Date Noted   Prostate cancer ([place]) 12/30/2017   Diverticulitis of large intestine with perforation without bleeding 11/28/2017   Obesity 08/23/2017   Elevated prostate specific antigen (PSA) 08/04/2015   Essential (primary) hypertension 08/04/2015   Ruptured tympanic membrane 08/04/2015   Extrapyramidal disease 08/04/2015   Hypertriglyceridemia 06/27/2014   History of colonic polyps 02/21/2012   Panic disorder 09/06/2001   Allergic rhinitis due to pollen 09/06/1998    Past Surgical History:  Procedure Laterality Date   LIPOMA EXCISION     located on  left shoulder   LYMPHADENECTOMY Bilateral 12/30/2017    Procedure: LYMPHADENECTOMY;  Surgeon: Alexis Frock, MD;  Location: [facility];  Service: Urology;  Laterality: Bilateral;   PROSTATE BIOPSY  01/2011   outpatient, Dr. Eliberto Ivory; 05/2011- mild chronic inflammation, no dysplasia   ROBOT ASSISTED LAPAROSCOPIC RADICAL PROSTATECTOMY N/A 12/30/2017   Procedure: XI ROBOTIC ASSISTED LAPAROSCOPIC RADICAL PROSTATECTOMY WITH PELVIC LYMPHADENECTOMY AND INJECTION OF INDOCYANINE GREEN DYE;  Surgeon: Alexis Frock, MD;  Location[facility];  Service: Urology;  Laterality: N/A;   [place]    Prior to Admission medications   Medication Sig Start Date End Date Taking? Authorizing Provider  albuterol (PROVENTIL HFA;VENTOLIN HFA) 108 (90 Base) MCG/ACT inhaler Inhale 2 puffs into the lungs every 6 (six) hours as needed for wheezing or shortness of breath. Patient not taking: Reported on 01/24/2019 02/12/16   Birdie Sons, MD  amLODipine-benazepril (LOTREL) 10-20 MG capsule TAKE 1 CAPSULE BY MOUTH EVERY DAY IN THE MORNING 12/19/18   [provider]  bicalutamide (CASODEX) 50 MG tablet  10/02/18   [provider]  BREO ELLIPTA 200-25 MCG/INH AEPB TAKE 1 PUFF BY MOUTH EVERY DAY**NEEDS OFFICE VISIT** 01/30/19   Birdie Sons, MD  clonazePAM (KLONOPIN) 0.5 MG tablet TAKE 1 TABLET BY MOUTH THREE TIMES A DAY AS NEEDED FOR ANXIETY 07/14/18   Birdie Sons, MD  fluticasone (FLONASE) 50 MCG/ACT nasal spray Place 2 sprays into both nostrils daily as needed for allergies or rhinitis.    [provider]  Loratadine 10 MG CAPS Take 1 tablet by mouth daily as needed (allergies).     [provider]  simvastatin (ZOCOR) 20 MG tablet Take 20 mg by mouth daily. 12/19/18   [provider]  venlafaxine XR (EFFEXOR-XR) 150 MG 24 hr capsule TAKE 1 CAPSULE BY MOUTH EVERY DAY 06/17/18   Birdie Sons, MD    Allergies Patient has no known allergies.  Family History  Problem Relation Age of Onset   COPD Mother     Kidney cancer Mother    Prostate cancer Father        treated with radiation   Rectal cancer Daughter    Hypertension Other    Breast cancer Neg Hx    Colon cancer Neg Hx    Pancreatic cancer Neg Hx     Social History Social History   Tobacco Use   Smoking status: Never   Smokeless tobacco: Never  Vaping Use   Vaping Use: Never used  Substance Use Topics   Alcohol use: Yes    Alcohol/week: 0.0 standard drinks    Comment: occasional use   Drug use: No    Review of Systems  Constitutional: No fever/chills Eyes: No visual changes. ENT: No sore throat. Cardiovascular: Denies chest pain. Respiratory: Denies shortness of breath. Gastrointestinal: No abdominal pain.  No nausea, no vomiting.  No diarrhea.  No constipation. Genitourinary: Negative for dysuria. Musculoskeletal: Negative for back pain. Skin: Negative for rash. Neurological: Negative for headaches, focal weakness or numbness.  Positive for depression and suicidal ideation.  ____________________________________________   PHYSICAL EXAM:  VITAL SIGNS: ED Triage Vitals  Enc Vitals Group     BP 06/18/21 1234 128/66     Pulse Rate 06/18/21 1234 81     Resp 06/18/21 1234 18     Temp 06/18/21 1234 98 F (36.7 C)     Temp Source 06/18/21 1234 Oral     SpO2 06/18/21 1234 100 %     Weight 06/18/21 1235 185 lb (83.9 kg)     Height 06/18/21 1235 5\' 8"  (1.727 m)     Head Circumference --      Peak Flow --      Pain Score 06/18/21 1235 0     Pain Loc --      Pain Edu? --      Excl. in [place]? --     Constitutional: Alert and oriented. Eyes: Conjunctivae are normal. Head: Atraumatic. Nose: No congestion/rhinnorhea. Mouth/Throat: Mucous membranes are moist. Neck: Normal ROM Cardiovascular: Normal rate, regular rhythm. Grossly normal heart sounds. Respiratory: Normal respiratory effort.  No retractions. Lungs CTAB. Gastrointestinal: Soft and nontender. No distention. Genitourinary: deferred Musculoskeletal: No  lower extremity tenderness nor edema. Neurologic:  Normal speech and language. No gross focal neurologic deficits are appreciated. Skin:  Skin is warm, dry and intact. No rash noted. Psychiatric: Anxious appearing, tearful.  Speech and behavior are normal.  ____________________________________________   LABS (all labs ordered are listed, but only abnormal results are displayed)  Labs Reviewed  COMPREHENSIVE METABOLIC PANEL - Abnormal; Notable for the following components:      Result Value   Glucose, Bld 131 (*)    Alkaline Phosphatase 30 (*)    All other components within normal limits  SALICYLATE LEVEL - Abnormal; Notable for the following components:   Salicylate Lvl <1.9 (*)    All other components within normal limits  ACETAMINOPHEN LEVEL - Abnormal; Notable for the following components:   Acetaminophen (Tylenol), Serum <10 (*)  All other components within normal limits  CBC - Abnormal; Notable for the following components:   RBC 5.88 (*)    Hemoglobin 18.0 (*)    HCT 52.5 (*)    All other components within normal limits  URINE DRUG SCREEN, QUALITATIVE ([facility] ONLY) - Abnormal; Notable for the following components:   MDMA (Ecstasy)Ur Screen POSITIVE (*)    Benzodiazepine, Ur Scrn POSITIVE (*)    All other components within normal limits  RESP PANEL BY RT-PCR (FLU A&B, COVID) ARPGX2  ETHANOL    PROCEDURES  Procedure(s) performed (including Critical Care):  Procedures   ____________________________________________   INITIAL IMPRESSION / ASSESSMENT AND PLAN / ED COURSE      60 year old male with past medical history of hypertension, hyperlipidemia, anxiety, and prostate cancer who presents to the ED complaining of increasing anxiety and depression for the past couple of months, is now having thoughts of suicide with no specific plan.  Patient is calm and cooperative, currently interested in receiving appropriate treatment and we will maintain his voluntary status.   He denies any medical complaints at this time, screening labs are pending.  We will consult psychiatry for further evaluation.  Screening labs are unremarkable, patient may be medically cleared for psychiatric disposition.  He was evaluated by psychiatry, who recommends inpatient admission.  No beds currently available here at [facility] and patient to be referred out for placement.  The patient has been placed in psychiatric observation due to the need to provide a safe environment for the patient while obtaining psychiatric consultation and evaluation, as well as ongoing medical and medication management to treat the patient's condition.  The patient has not been placed under full IVC at this time.       ____________________________________________   FINAL CLINICAL IMPRESSION(S) / ED DIAGNOSES  Final diagnoses:  Suicidal ideation     ED Discharge Orders     None        Note:  This document was prepared using Dragon voice recognition software and may include unintentional dictation errors.    Blake Divine, MD 06/18/21 1336

## 2021-06-18 NOTE — Consult Note (Signed)
Patient has been accepted to [facility], room 404  by [facility] Reader. Dr. Nelda Marseille is admitting doctor. Writer obtained patient's signature for voluntary admission and faxed it to [phone]. [facility] secretary called for safe transport.  Sherlon Handing, PMHNP

## 2021-06-19 ENCOUNTER — Encounter (HOSPITAL_COMMUNITY): Payer: Self-pay

## 2021-06-19 DIAGNOSIS — F411 Generalized anxiety disorder: Secondary | ICD-10-CM

## 2021-06-19 DIAGNOSIS — F332 Major depressive disorder, recurrent severe without psychotic features: Principal | ICD-10-CM

## 2021-06-19 LAB — CBC
HCT: 53.1 % — ABNORMAL HIGH (ref 39.0–52.0)
Hemoglobin: 17.3 g/dL — ABNORMAL HIGH (ref 13.0–17.0)
MCH: 29.6 pg (ref 26.0–34.0)
MCHC: 32.6 g/dL (ref 30.0–36.0)
MCV: 90.9 fL (ref 80.0–100.0)
Platelets: 343 10*3/uL (ref 150–400)
RBC: 5.84 MIL/uL — ABNORMAL HIGH (ref 4.22–5.81)
RDW: 12.6 % (ref 11.5–15.5)
WBC: 8.1 10*3/uL (ref 4.0–10.5)
nRBC: 0 % (ref 0.0–0.2)

## 2021-06-19 LAB — HEMOGLOBIN A1C
Hgb A1c MFr Bld: 5.5 % (ref 4.8–5.6)
Mean Plasma Glucose: 111.15 mg/dL

## 2021-06-19 LAB — LIPID PANEL
Cholesterol: 184 mg/dL (ref 0–200)
HDL: 62 mg/dL (ref >40)
LDL Cholesterol: 107 mg/dL — ABNORMAL HIGH (ref 0–99)
Total CHOL/HDL Ratio: 3 RATIO
Triglycerides: 76 mg/dL (ref <150)
VLDL: 15 mg/dL (ref 0–40)

## 2021-06-19 MED ORDER — DESVENLAFAXINE SUCCINATE ER 25 MG PO TB24
25.0000 mg | ORAL_TABLET | Freq: Every day | ORAL | Status: DC
  Administered 2021-06-19: 25 mg via ORAL
  Filled 2021-06-19 (×2): qty 1

## 2021-06-19 MED ORDER — OLANZAPINE 2.5 MG PO TABS
2.5000 mg | ORAL_TABLET | Freq: Once | ORAL | Status: AC
  Administered 2021-06-19: 2.5 mg via ORAL
  Filled 2021-06-19 (×2): qty 1

## 2021-06-19 MED ORDER — CLONAZEPAM 0.5 MG PO TABS
0.5000 mg | ORAL_TABLET | Freq: Two times a day (BID) | ORAL | Status: DC
  Administered 2021-06-19 – 2021-06-29 (×20): 0.5 mg via ORAL
  Filled 2021-06-19 (×20): qty 1

## 2021-06-19 MED ORDER — OLANZAPINE 2.5 MG PO TABS
2.5000 mg | ORAL_TABLET | Freq: Once | ORAL | Status: DC
  Filled 2021-06-19: qty 1

## 2021-06-19 MED ORDER — OLANZAPINE 5 MG PO TABS
5.0000 mg | ORAL_TABLET | Freq: Every day | ORAL | Status: DC
  Administered 2021-06-20 – 2021-06-28 (×9): 5 mg via ORAL
  Filled 2021-06-19 (×11): qty 1

## 2021-06-19 MED ORDER — FLUOXETINE HCL 10 MG PO CAPS
10.0000 mg | ORAL_CAPSULE | Freq: Every day | ORAL | Status: DC
Start: 2021-06-20 — End: 2021-06-19
  Filled 2021-06-19: qty 1

## 2021-06-19 MED ORDER — FLUOXETINE HCL 10 MG PO CAPS
10.0000 mg | ORAL_CAPSULE | Freq: Once | ORAL | Status: AC
  Administered 2021-06-20: 10 mg via ORAL
  Filled 2021-06-19: qty 1

## 2021-06-19 MED ORDER — FLUOXETINE HCL 20 MG PO CAPS
20.0000 mg | ORAL_CAPSULE | Freq: Every day | ORAL | Status: DC
  Administered 2021-06-21 – 2021-06-22 (×2): 20 mg via ORAL
  Filled 2021-06-19 (×4): qty 1

## 2021-06-19 MED ORDER — ARIPIPRAZOLE 5 MG PO TABS
5.0000 mg | ORAL_TABLET | Freq: Every day | ORAL | Status: DC
  Administered 2021-06-19: 5 mg via ORAL
  Filled 2021-06-19 (×2): qty 1

## 2021-06-19 NOTE — Progress Notes (Signed)
Pt denies SI/HI/AVH and verbally agrees to approach staff if these become apparent or before harming themselves/others. Rates depression 6/10. Rates anxiety 7/10. Rates pain 0/10. Pt stated that his mood is apprehensive due to the "situation that I am in." Pt is pleasant but depressed. Pt stated "I slept okay." Pt later seemed very distressed and said he was anxious. Scheduled medications administered to Pt, per MD orders. RN provided support and encouragement to Pt. Q15 min safety checks implemented and continued. Pt safe on the unit. RN will continue to monitor and intervene as needed.   06/19/21 0800  Psych Admission Type (Psych Patients Only)  Admission Status Voluntary  Psychosocial Assessment  Patient Complaints Anxiety;Depression;Suspiciousness;Other (Comment) ("apprehensive")  Eye Contact Fair  Facial Expression Anxious;Sad;Worried  Affect Sad;Anxious;Depressed  Speech Logical/coherent;Soft  Interaction Forwards little;Minimal  Motor Activity Slow  Appearance/Hygiene In scrubs  Behavior Characteristics Cooperative;Calm  Mood Depressed;Anxious;Pleasant;Sad;Suspicious  Thought Process  Coherency WDL  Content Blaming self  Delusions None reported or observed  Perception WDL  Hallucination None reported or observed  Judgment Poor  Confusion None  Danger to Self  Current suicidal ideation? Denies  Self-Injurious Behavior No self-injurious ideation or behavior indicators observed or expressed   Agreement Not to Harm Self Yes  Description of Agreement verbal contract for safety  Danger to Others  Danger to Others None reported or observed

## 2021-06-19 NOTE — Progress Notes (Signed)
Pt visible on the unit this evening. Pt stated he was getting used to the unit, pt given PRN Trazodone and Vistaril per [facility]    06/19/21 0000  Psych Admission Type (Psych Patients Only)  Admission Status Voluntary  Psychosocial Assessment  Patient Complaints Anxiety;Depression  Eye Contact Fair  Facial Expression Anxious;Sullen;Sad;Worried  Affect Anxious;Depressed;Sad;Sullen  Speech Logical/coherent  Interaction Assertive  Motor Activity Slow  Appearance/Hygiene In scrubs  Thought Process  Coherency Concrete thinking  Content Blaming self  Delusions None reported or observed  Perception WDL  Hallucination None reported or observed  Judgment Poor  Confusion None  Danger to Self  Current suicidal ideation? Passive  Self-Injurious Behavior Some self-injurious ideation observed or expressed.  No lethal plan expressed   Agreement Not to Harm Self Yes  Description of Agreement verbal contract for safety  Danger to Others  Danger to Others None reported or observed

## 2021-06-19 NOTE — BHH Suicide Risk Assessment (Signed)
[facility] Admission Suicide Risk Assessment   Nursing information obtained from:  Patient Demographic factors:  Caucasian, Unemployed, Low socioeconomic status Current Mental Status:  Suicidal ideation indicated by patient, Self-harm thoughts Loss Factors:  Decrease in vocational status, Financial problems / change in socioeconomic status, Decline in physical health Historical Factors:  Impulsivity Risk Reduction Factors:  Sense of responsibility to family, Positive social support, Positive coping skills or problem solving skills, Living with another person, especially a relative, Positive therapeutic relationship, Responsible for children under 19 years of age  Total Time spent with patient: 45 minutes Principal Problem: Severe recurrent major depression without psychotic features ([place]) Diagnosis:  Principal Problem:   Severe recurrent major depression without psychotic features ([place]) Active Problems:   Panic disorder (episodic paroxysmal anxiety)   GAD (generalized anxiety disorder)  Subjective Data:  60 year old male with a past psychiatric history of major depressive disorder, generalized anxiety, panic disorder, was admitted to the psychiatric unit for evaluation and treatment of suicidal thoughts with plan, worsening depression, and worsening anxiety.  Patient reports multiyear history of worsening depression since about 2019 or 2020, that followed chemotherapy and Lupron therapy for prostate cancer. Patient reports that suicidal thoughts started about 3 to 4 months ago, and worsened in intensity, frequency, and severity during this time period, up to the point which his girlfriend brought him to the emergency department of outside hospital.  Patient reports having severe depression, sadness, anhedonia, severe anxiety, and obsessive thoughts about his work, loss of job, and finances.  Patient also reports having panic attacks.  Patient reports recently trying Zoloft, Wellbutrin, Adderall,  Effexor, and Abilify, for treatment of the above symptoms, none of which provided significant relief.  Patient does report history of using Effexor, about 20 years ago, for many years, at that time was very effective for treating depression, but when this medication was restarted recently, it was not effective.  She does report that recently Rexulti was helpful for mood, but he was unable to for this medication.  We discussed patient's long history of depression, starting as a teenager.  We also discussed patient's severe anxiety symptoms and panic attacks.  We discussed the patient had symptoms of euphoric mood, for many days, with associated symptoms of impulsivity, spending more money, having racing thoughts, increasing goal-directed behavior.  Patient reports having episodes like this in the past, but is vague about timeline or details when he has had these.  There is also a family history of bipolar disorder.  There is a personal history of note antidepressant trials and the current depressive episode.  We discussed patient could have mood cycling and bipolar disorder, and this could be bipolar depression, but we cannot make a definitive diagnosis of bipolar disorder at this time.  Patient is agreeable to starting treatment with Prozac and Zyprexa for severe depression and possible bipolar depression.  This combination of medications will also help with insomnia, poor appetite, anxiety, and anxious ruminative obsessive thoughts.  We will also schedule clonazepam, as this is brought his anxiety level from a 10 to a 5, since admission.    Continued Clinical Symptoms:  Alcohol Use Disorder Identification Test Final Score (AUDIT): 5 The "Alcohol Use Disorders Identification Test", Guidelines for Use in Primary Care, Second Edition.  World Pharmacologist [facility]). Score between 0-7:  no or low risk or alcohol related problems. Score between 8-15:  moderate risk of alcohol related problems. Score between  16-19:  high risk of alcohol related problems. Score 20 or above:  warrants further diagnostic evaluation for alcohol dependence and treatment.   CLINICAL FACTORS:   Severe Anxiety and/or Agitation Depression:   Anhedonia Hopelessness Insomnia   Musculoskeletal: Strength & Muscle Tone: within normal limits Gait & Station: normal Patient leans: N/A  Psychiatric Specialty Exam:  Presentation  General Appearance: Appropriate for Environment; Casual; Fairly Groomed  Eye Contact:Fleeting  Speech:Normal Rate  Speech Volume:Decreased  Handedness:Right   Mood and Affect  Mood:Anxious; Depressed; Dysphoric; Hopeless  Affect:Congruent; Constricted; Depressed; Tearful   Thought Process  Thought Processes:Linear  Descriptions of Associations:Intact  Orientation:Full (Time, Place and Person)  Thought Content:Logical  History of Schizophrenia/Schizoaffective disorder:No data recorded Duration of Psychotic Symptoms:No data recorded Hallucinations:Hallucinations: None  Ideas of Reference:None  Suicidal Thoughts:Suicidal Thoughts: Yes, Passive SI Passive Intent and/or Plan: Without Intent; Without Plan  Homicidal Thoughts:Homicidal Thoughts: No   Sensorium  Memory:Immediate Good; Recent Good; Remote Good  Judgment:Fair  Insight:Fair   Executive Functions  Concentration:Poor  Attention Span:Fair  Recall:Good  Fund of Knowledge:Good  Language:Good   Psychomotor Activity  Psychomotor Activity:Psychomotor Activity: Normal   Assets  Assets:Communication Skills; Desire for Improvement; Housing; Resilience; Social Support   Sleep  Sleep:Sleep: Poor Number of Hours of Sleep: 6.5    Physical Exam: Physical Exam see H&P ROS see H&P Blood pressure 107/86, pulse (!) 103, temperature 97.6 F (36.4 C), temperature source Oral, resp. rate 18, height 5\' 8"  (1.727 m), weight 83.9 kg, SpO2 97 %. Body mass index is 28.13 kg/m.   COGNITIVE FEATURES THAT  CONTRIBUTE TO RISK:  None    SUICIDE RISK:   Moderate:  Frequent suicidal ideation with limited intensity, and duration, some specificity in terms of plans, no associated intent, good self-control, limited dysphoria/symptomatology, some risk factors present, and identifiable protective factors, including available and accessible social support.  PLAN OF CARE:   ASSESSMENT:  Diagnoses / Active Problems: Major depressive disorder versus bipolar depression GAD Panic disorder  PLAN: Safety and Monitoring:  -- Voluntary admission to inpatient psychiatric unit for safety, stabilization and treatment  -- Daily contact with patient to assess and evaluate symptoms and progress in treatment  -- Patient's case to be discussed in multi-disciplinary team meeting  -- Observation Level : q15 minute checks  -- Vital signs:  q12 hours  -- Precautions: suicide, elopement, and assault  2. Psychiatric Diagnoses and Treatment:  Stop or seek Stop Abilify Start Prozac 10 mg once daily, and titrate up during the weekend, for depression Start Zyprexa 2.5 mg at bedtime, and titrate up during the weekend, for depression, insomnia, poor appetite, anxiety, and ruminative obsessive thoughts Change clonazepam from as needed to scheduled Continue other medications as ordered  --  The risks/benefits/side-effects/alternatives to this medication were discussed in detail with the patient and time was given for questions. The patient consents to medication trial.   -- Metabolic profile and EKG monitoring obtained while on an atypical antipsychotic (BMI: Lipid Panel: HbgA1c: QTc:)   -- Encouraged patient to participate in unit milieu and in scheduled group therapies   -- Short Term Goals: Ability to identify changes in lifestyle to reduce recurrence of condition will improve, Ability to verbalize feelings will improve, Ability to disclose and discuss suicidal ideas, Ability to demonstrate self-control will improve,  Ability to identify and develop effective coping behaviors will improve, Ability to maintain clinical measurements within normal limits will improve, Compliance with prescribed medications will improve, and Ability to identify triggers associated with substance abuse/mental health issues will improve  -- Long Term Goals: Improvement  in symptoms so as ready for discharge     3. Medical Issues Being Addressed:   Tobacco Use Disorder  -- Nicotine patch 21mg /24 hours ordered  -- Smoking cessation encouraged  4. Discharge Planning:   -- Social work and case management to assist with discharge planning and identification of hospital follow-up needs prior to discharge  -- Estimated LOS: 5-7 days  -- Discharge Concerns: Need to establish a safety plan; Medication compliance and effectiveness  -- Discharge Goals: Return home with outpatient referrals for mental health follow-up including medication management/psychotherapy  Total Time Spent in Direct Patient Care:  I personally spent 60 minutes on the unit in direct patient care. The direct patient care time included face-to-face time with the patient, reviewing the patient's chart, communicating with other professionals, and coordinating care. Greater than 50% of this time was spent in counseling or coordinating care with the patient regarding goals of hospitalization, psycho-education, and discharge planning needs.    I certify that inpatient services furnished can reasonably be expected to improve the patient's condition.   Christoper Allegra, MD 06/19/2021, 5:16 PM

## 2021-06-19 NOTE — Group Note (Signed)
Type of Therapy and Topic: Group Therapy: Gratitude  Participation: Minimal  Description of Group: The purpose behind this group is to get people thinking about things for which  they can be grateful. If continued over time, they might begin to spontaneously look for things and  situations for which to be grateful. Gratitude is related to a "wide variety of forms of wellbeing , whereas "negative attributions" can adversely affect relationships.  Several studies have shown that interventions to  increase gratitude can impact areas such as overall life satisfaction, decreased negative affect, increased happiness, the ability to provide emotional support to others, and decreased worrying.   Therapeutic Goals: Patient will learn activities that focus on gratitude in their daily lives. Patient will share gratitude in their daily lives. Patient will learn to develop healthy habits and positive thinking techniques. Patient will receive support and feedback from others  Therapeutic Modalities: Cognitive Behavioral Therapy Solution Focused Therapy Motivational Interviewing   Elmus Mathes, LCSW, [place] Worker  [facility]

## 2021-06-19 NOTE — BH IP Treatment Plan (Signed)
Interdisciplinary Treatment and Diagnostic Plan Update  06/19/2021 Time of Session: 1:55pm Gerald Boyer MRN: [id number]  Principal Diagnosis: Severe recurrent major depression without psychotic features [facility])  Secondary Diagnoses: Principal Problem:   Severe recurrent major depression without psychotic features ([place])   Current Medications:  Current Facility-Administered Medications  Medication Dose Route Frequency Provider Last Rate Last Admin   acetaminophen (TYLENOL) tablet 650 mg  650 mg Oral Q6H PRN Clapacs, Madie Reno, MD       alum & mag hydroxide-simeth (MAALOX/MYLANTA) 200-200-20 MG/5ML suspension 30 mL  30 mL Oral Q4H PRN Clapacs, Madie Reno, MD       amLODipine (NORVASC) tablet 10 mg  10 mg Oral Daily Clapacs, Madie Reno, MD   10 mg at 06/19/21 4656   And   benazepril (LOTENSIN) tablet 20 mg  20 mg Oral Daily Clapacs, Madie Reno, MD   20 mg at 06/19/21 0755   ARIPiprazole (ABILIFY) tablet 5 mg  5 mg Oral Daily Lindell Spar I, NP   5 mg at 06/19/21 1409   clonazePAM (KLONOPIN) tablet 0.5 mg  0.5 mg Oral BID PRN Clapacs, Madie Reno, MD       Desvenlafaxine Succinate ER TB24 25 mg  25 mg Oral Daily Nwoko, Agnes I, NP   25 mg at 06/19/21 1411   fluticasone furoate-vilanterol (BREO ELLIPTA) 200-25 MCG/INH 1 puff  1 puff Inhalation Daily Clapacs, Madie Reno, MD       hydrOXYzine (ATARAX/VISTARIL) tablet 50 mg  50 mg Oral TID PRN Clapacs, Madie Reno, MD   50 mg at 06/19/21 1412   magnesium hydroxide (MILK OF MAGNESIA) suspension 30 mL  30 mL Oral Daily PRN Clapacs, Madie Reno, MD       simvastatin (ZOCOR) tablet 20 mg  20 mg Oral q1800 Clapacs, Madie Reno, MD       traZODone (DESYREL) tablet 100 mg  100 mg Oral QHS PRN Clapacs, Madie Reno, MD   100 mg at 06/18/21 2135   PTA Medications: Medications Prior to Admission  Medication Sig Dispense Refill Last Dose   albuterol (PROVENTIL HFA;VENTOLIN HFA) 108 (90 Base) MCG/ACT inhaler Inhale 2 puffs into the lungs every 6 (six) hours as needed for wheezing or shortness of  breath. (Patient not taking: Reported on 01/24/2019) 1 Inhaler 2    amLODipine-benazepril (LOTREL) 10-20 MG capsule TAKE 1 CAPSULE BY MOUTH EVERY DAY IN THE MORNING      bicalutamide (CASODEX) 50 MG tablet  (Patient not taking: No sig reported)      BREO ELLIPTA 200-25 MCG/INH AEPB TAKE 1 PUFF BY MOUTH EVERY DAY**NEEDS OFFICE VISIT** (Patient not taking: No sig reported) 60 each 4    clonazePAM (KLONOPIN) 0.5 MG tablet TAKE 1 TABLET BY MOUTH THREE TIMES A DAY AS NEEDED FOR ANXIETY (Patient not taking: No sig reported) 60 tablet 1    fenofibrate (TRICOR) 145 MG tablet Take 145 mg by mouth daily.      fluticasone (FLONASE) 50 MCG/ACT nasal spray Place 2 sprays into both nostrils daily as needed for allergies or rhinitis.      Loratadine 10 MG CAPS Take 1 tablet by mouth daily as needed (allergies).       sertraline (ZOLOFT) 100 MG tablet Take 100 mg by mouth every morning. (Patient not taking: No sig reported)      simvastatin (ZOCOR) 20 MG tablet Take 20 mg by mouth daily. (Patient not taking: No sig reported)      venlafaxine XR (EFFEXOR-XR) 150 MG 24  hr capsule TAKE 1 CAPSULE BY MOUTH EVERY DAY (Patient not taking: No sig reported) 90 capsule 4     Patient Stressors: Financial difficulties   Health problems   Medication change or noncompliance   Occupational concerns    Patient Strengths: Ability for insight  Average or above average intelligence  Capable of independent living  Motivation for treatment/growth  Supportive family/friends  Work skills   Treatment Modalities: Medication Management, Group therapy, Case management,  1 to 1 session with clinician, Psychoeducation, Recreational therapy.   Physician Treatment Plan for Primary Diagnosis: Severe recurrent major depression without psychotic features ([place]) Long Term Goal(s): Improvement in symptoms so as ready for discharge   Short Term Goals: Ability to identify and develop effective coping behaviors will improve Ability to  maintain clinical measurements within normal limits will improve Compliance with prescribed medications will improve Ability to identify triggers associated with substance abuse/mental health issues will improve Ability to identify changes in lifestyle to reduce recurrence of condition will improve Ability to verbalize feelings will improve Ability to disclose and discuss suicidal ideas Ability to demonstrate self-control will improve  Medication Management: Evaluate patient's response, side effects, and tolerance of medication regimen.  Therapeutic Interventions: 1 to 1 sessions, Unit Group sessions and Medication administration.  Evaluation of Outcomes: Not Met  Physician Treatment Plan for Secondary Diagnosis: Principal Problem:   Severe recurrent major depression without psychotic features ([place])  Long Term Goal(s): Improvement in symptoms so as ready for discharge   Short Term Goals: Ability to identify and develop effective coping behaviors will improve Ability to maintain clinical measurements within normal limits will improve Compliance with prescribed medications will improve Ability to identify triggers associated with substance abuse/mental health issues will improve Ability to identify changes in lifestyle to reduce recurrence of condition will improve Ability to verbalize feelings will improve Ability to disclose and discuss suicidal ideas Ability to demonstrate self-control will improve     Medication Management: Evaluate patient's response, side effects, and tolerance of medication regimen.  Therapeutic Interventions: 1 to 1 sessions, Unit Group sessions and Medication administration.  Evaluation of Outcomes: Not Met   RN Treatment Plan for Primary Diagnosis: Severe recurrent major depression without psychotic features ([place]) Long Term Goal(s): Knowledge of disease and therapeutic regimen to maintain health will improve  Short Term Goals: Ability to remain free from  injury will improve, Ability to verbalize frustration and anger appropriately will improve, Ability to demonstrate self-control, Ability to participate in decision making will improve, Ability to identify and develop effective coping behaviors will improve, and Compliance with prescribed medications will improve  Medication Management: RN will administer medications as ordered by provider, will assess and evaluate patient's response and provide education to patient for prescribed medication. RN will report any adverse and/or side effects to prescribing provider.  Therapeutic Interventions: 1 on 1 counseling sessions, Psychoeducation, Medication administration, Evaluate responses to treatment, Monitor vital signs and CBGs as ordered, Perform/monitor CIWA, COWS, AIMS and Fall Risk screenings as ordered, Perform wound care treatments as ordered.  Evaluation of Outcomes: Not Met   LCSW Treatment Plan for Primary Diagnosis: Severe recurrent major depression without psychotic features ([place]) Long Term Goal(s): Safe transition to appropriate next level of care at discharge, Engage patient in therapeutic group addressing interpersonal concerns.  Short Term Goals: Engage patient in aftercare planning with referrals and resources, Increase social support, Increase ability to appropriately verbalize feelings, Increase emotional regulation, Identify triggers associated with mental health/substance abuse issues, and Increase skills for  wellness and recovery  Therapeutic Interventions: Assess for all discharge needs, 1 to 1 time with Social worker, Explore available resources and support systems, Assess for adequacy in community support network, Educate family and significant other(s) on suicide prevention, Complete Psychosocial Assessment, Interpersonal group therapy.  Evaluation of Outcomes: Not Met   Progress in Treatment: Attending groups: Yes. Participating in groups: Yes. Taking medication as  prescribed: Yes. Toleration medication: Yes. Family/Significant other contact made: No, will contact:  girlfriend Patient understands diagnosis: Yes. Discussing patient identified problems/goals with staff: Yes. Medical problems stabilized or resolved: Yes. Denies suicidal/homicidal ideation: Yes. Issues/concerns per patient self-inventory: No.   New problem(s) identified: No, Describe:  none  New Short Term/Long Term Goal(s): medication stabilization, elimination of SI thoughts, development of comprehensive mental wellness plan.    Patient Goals:  "To get mentally healthy"  Discharge Plan or Barriers: Patient recently admitted. CSW will continue to follow and assess for appropriate referrals and possible discharge planning.    Reason for Continuation of Hospitalization: Anxiety Mania Medication stabilization  Estimated Length of Stay: 3-5 days   Scribe for Treatment Team: Vassie Moselle, LCSW 06/19/2021 2:37 PM

## 2021-06-19 NOTE — BHH Group Notes (Signed)
[place] Group Notes:  (Nursing/MHT/Case Management/Adjunct)  Date:  06/19/2021  Time:  9:32 AM  Type of Therapy:  Group Therapy  Participation Level:  Active  Participation Quality:  Appropriate  Affect:  Appropriate  Cognitive:  Appropriate  Insight:  Appropriate  Engagement in Group:  Engaged  Modes of Intervention:  Discussion and Education  Summary of Progress/Problems:Pt was engaged in orientation and goals group.  Garvin Fila 06/19/2021, 9:32 AM

## 2021-06-19 NOTE — Progress Notes (Signed)
Pt stated he was feeling the same, pt hopeful about starting new medications. Pt visible on the unit    06/19/21 2000  Psych Admission Type (Psych Patients Only)  Admission Status Voluntary  Psychosocial Assessment  Patient Complaints Anxiety;Depression  Eye Contact Fair  Facial Expression Anxious;Sad;Worried  Affect Sad;Anxious;Depressed  Speech Logical/coherent;Soft  Interaction Forwards little;Minimal  Motor Activity Slow  Appearance/Hygiene In scrubs  Behavior Characteristics Cooperative  Mood Depressed  Thought Process  Coherency WDL  Content Blaming self  Delusions None reported or observed  Perception WDL  Hallucination None reported or observed  Judgment Poor  Confusion None  Danger to Self  Current suicidal ideation? Denies  Self-Injurious Behavior No self-injurious ideation or behavior indicators observed or expressed   Agreement Not to Harm Self Yes  Description of Agreement verbal contract for safety  Danger to Others  Danger to Others None reported or observed

## 2021-06-19 NOTE — H&P (Addendum)
Psychiatric Admission Assessment Adult  Patient Identification: Gerald Boyer  MRN:  [id number]  Date of Evaluation:  06/19/2021  Chief Complaint: Worsening symptoms of depression triggering suicidal ideations.  Principal Diagnosis: Severe recurrent major depression without psychotic features ([place])  Diagnosis:  Principal Problem:   Severe recurrent major depression without psychotic features ([place]) Active Problems:   Panic disorder (episodic paroxysmal anxiety)  History of Present Illness: This is the first psychiatric admission in this [facility] for this 60 year old Caucasian male with hx of major depressive disorder & medical diagnosis that includes; s/p prostate cancer, HTN, Hyperlipidemia & COPD. Admitted to the [facility] from the [facility] with complaint of worsening symptoms of depression, anxiety/panic symptoms, poor concentration, excessive worrying, forgetfulness, decreased sleep & hopelessness & suicidal ideations. Patient apparently has battled depression most of his life & was most recently had received mental health treatment under the care of Dr. Nicolasa Boyer. He was brought to the [facility] for evaluation/treatments. Patient's chart is reviewed, including lab result. This case is discussed with the attending psychiatrist & treatment is noted below. During this evaluation, Mr. Gerald Boyer reports,   "My girlfriend took me to the [facility] yesterday. I could not take it anymore.  The obsessive thoughts of going back to work, trying to find another job & how not to go homeless has been overwhelming. I have been living off my savings since I lost my job & I'm constantly worrying about my aging parents. I have been depressed badly going on 18 months. About 20 years ago, I had similar bout of depression, took Effexor that did help. I was on Effexor for a long time, but was able to get off of it. I have tried Effexor this time around without any benefit to it. In 2019, was diagnosed with prostate cancer at stage 3, had  surgery, received radiation therapy, took Lupron. I believe that the Lupron affected my concentration, my memory & I'm no longer able to handle stress of any kind. 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 I also had health insurance. I lost both my job & health insurance soon after my cancer treatment. In 2020, I started having panic attacks. The first time that I had panic attack was in 2013, a year after my divorce. My panic attack did get better, but returned in 2020 at it's worse. I went to the ED once in 2020 due to the panic attack, but it is a constant thing this time except when I'm asleep. I was seeing Dr. Nicolasa Boyer from September last year to February of this year 2022 for my depression/panic attacks. She tried me on Effexor, Wellbutrin, Sertraline & even Aderrall for the lack of concentration, but none of these medicines helped. Then she tried me on Rexulti, which was helpful, but super expensive. Then, I lost my health insurance. My primary care doctor started me on alprazolam 0.5 mg bid prn, but I only was taking 1/2 tablet of the 0.5 mg if I needed it. Mental illness runs deep on my maternal side of the family. My maternal grandmother had bipolar disorder & my maternal uncle committed suicide. I have no mood swings, hallucinations, delusions or paranoia. I have never attempted suicide. I don't use drugs or alcohol, no cigarette smoking. I don't have any appetite & I have lost 15 pounds in 2 months. I sleep well with Trazodone".  Associated Signs/Symptoms:  Depression Symptoms:  depressed mood, anhedonia, insomnia, feelings of worthlessness/guilt, difficulty  concentrating, hopelessness, impaired memory,  Duration of Depression Symptoms: Greater than 2 weeks.  (Hypo) Manic Symptoms:   Denies  Anxiety Symptoms:  Excessive Worry, Panic Symptoms,  Psychotic Symptoms:   Denies any hallucinations, delusions or paranoia.  PTSD Symptoms: NA  Total Time spent  with patient: 1 hour  Past Psychiatric History: Major depressive disorder.  Is the patient at risk to self? No.  Has the patient been a risk to self in the past 6 months? Yes.    Has the patient been a risk to self within the distant pDeniesast? Yes.    Is the patient a risk to others? No.  Has the patient been a risk to others in the past 6 months? No.  Has the patient been a risk to others within the distant past? No.   Prior Inpatient Therapy: Denies. Prior Outpatient Therapy: Yes with Dr. Nicolasa Boyer.  Alcohol Screening: 1. How often do you have a drink containing alcohol?: 4 or more times a week 2. How many drinks containing alcohol do you have on a typical day when you are drinking?: 3 or 4 3. How often do you have six or more drinks on one occasion?: Never AUDIT-C Score: 5 4. How often during the last year have you found that you were not able to stop drinking once you had started?: Never 5. How often during the last year have you failed to do what was normally expected from you because of drinking?: Never 6. How often during the last year have you needed a first drink in the morning to get yourself going after a heavy drinking session?: Never 7. How often during the last year have you had a feeling of guilt of remorse after drinking?: Never 8. How often during the last year have you been unable to remember what happened the night before because you had been drinking?: Never 9. Have you or someone else been injured as a result of your drinking?: No 10. Has a relative or friend or a doctor or another health worker been concerned about your drinking or suggested you cut down?: No Alcohol Use Disorder Identification Test Final Score (AUDIT): 5  Substance Abuse History in the last 12 months:  No.  Consequences of Substance Abuse: NA  Previous Psychotropic Medications:  Sertraline, Wellbutrin, Effexor, Rexulti , Adderall  Psychological Evaluations: No   Past Medical History:  Past  Medical History:  Diagnosis Date   Allergy    Anxiety    Cancer ([place])    Diverticulitis    Hyperlipidemia    Hypertension    Prostate cancer [facility])     Past Surgical History:  Procedure Laterality Date   LIPOMA EXCISION     located on  left shoulder   LYMPHADENECTOMY Bilateral 12/30/2017   Procedure: LYMPHADENECTOMY;  Surgeon: Alexis Frock, MD;  Location: [facility];  Service: Urology;  Laterality: Bilateral;   PROSTATE BIOPSY  01/2011   outpatient, Dr. Eliberto Ivory; 05/2011- mild chronic inflammation, no dysplasia   ROBOT ASSISTED LAPAROSCOPIC RADICAL PROSTATECTOMY N/A 12/30/2017   Procedure: XI ROBOTIC ASSISTED LAPAROSCOPIC RADICAL PROSTATECTOMY WITH PELVIC LYMPHADENECTOMY AND INJECTION OF INDOCYANINE GREEN DYE;  Surgeon: Alexis Frock, MD;  Location: [facility];  Service: Urology;  Laterality: N/A;   TONSILLECTOMY     VASECTOMY  1995   Family History:  Family History  Problem Relation Age of Onset   COPD Mother    Kidney cancer Mother    Prostate cancer Father        treated with  radiation   Rectal cancer Daughter    Hypertension Other    Breast cancer Neg Hx    Colon cancer Neg Hx    Pancreatic cancer Neg Hx    Family Psychiatric  History: Bipolar disorder: Maternal grandmother.  Tobacco Screening: Does not smoke or use tobacco products.  Social History: Divorced, has 3 children, unemployed, lives in [place], [place]. Social History   Substance and Sexual Activity  Alcohol Use Yes   Comment: 2-4 beers/day     Social History   Substance and Sexual Activity  Drug Use No   Comment: + benzos and MDMA    Additional Social History:  Allergies:  No Known Allergies  Lab Results:  Results for orders placed or performed during the hospital encounter of 06/18/21 (from the past 48 hour(s))  Hemoglobin A1c     Status: None   Collection Time: 06/19/21  6:19 AM  Result Value Ref Range   Hgb A1c MFr Bld 5.5 4.8 - 5.6 %    Comment: (NOTE) Pre diabetes:           5.7%-6.4%  Diabetes:              >6.4%  Glycemic control for   <7.0% adults with diabetes    Mean Plasma Glucose 111.15 mg/dL    Comment: Performed at [facility], [place] [address] [place], [place] [address]  Lipid panel     Status: Abnormal   Collection Time: 06/19/21  6:19 AM  Result Value Ref Range   Cholesterol 184 0 - 200 mg/dL   Triglycerides 76 <150 mg/dL   HDL 62 >40 mg/dL   Total CHOL/HDL Ratio 3.0 RATIO   VLDL 15 0 - 40 mg/dL   LDL Cholesterol 107 (H) 0 - 99 mg/dL    Comment:        Total Cholesterol/HDL:CHD Risk Coronary Heart Disease Risk Table                     Men   Women  1/2 Average Risk   3.4   3.3  Average Risk       5.0   4.4  2 X Average Risk   9.6   7.1  3 X Average Risk  23.4   11.0        Use the calculated Patient Ratio above and the CHD Risk Table to determine the patient's CHD Risk.        ATP III CLASSIFICATION (LDL):  <100     mg/dL   Optimal  100-129  mg/dL   Near or Above                    Optimal  130-159  mg/dL   Borderline  160-189  mg/dL   High  >190     mg/dL   Very High Performed at [facility] [address], [place], [place] [address]    Blood Alcohol level:  Lab Results  Component Value Date   ETH <10 37/34/2876   Metabolic Disorder Labs:  Lab Results  Component Value Date   HGBA1C 5.5 06/19/2021   MPG 111.15 06/19/2021   No results found for: PROLACTIN Lab Results  Component Value Date   CHOL 184 06/19/2021   TRIG 76 06/19/2021   HDL 62 06/19/2021   CHOLHDL 3.0 06/19/2021   VLDL 15 06/19/2021   LDLCALC 107 (H) 06/19/2021   LDLCALC 129 (H) 08/23/2017   Current Medications:  Current Facility-Administered Medications  Medication Dose Route Frequency Provider Last Rate Last Admin   acetaminophen (TYLENOL) tablet 650 mg  650 mg Oral Q6H PRN Clapacs, Madie Reno, MD       alum & mag hydroxide-simeth (MAALOX/MYLANTA) 200-200-20 MG/5ML suspension 30 mL  30 mL Oral Q4H PRN Clapacs, Madie Reno, MD        amLODipine (NORVASC) tablet 10 mg  10 mg Oral Daily Clapacs, Madie Reno, MD   10 mg at 06/19/21 0981   And   benazepril (LOTENSIN) tablet 20 mg  20 mg Oral Daily Clapacs, Madie Reno, MD   20 mg at 06/19/21 0755   ARIPiprazole (ABILIFY) tablet 5 mg  5 mg Oral Daily Lindell Spar I, NP   5 mg at 06/19/21 1409   clonazePAM (KLONOPIN) tablet 0.5 mg  0.5 mg Oral BID PRN Clapacs, Madie Reno, MD       Desvenlafaxine Succinate ER TB24 25 mg  25 mg Oral Daily Ludell Zacarias I, NP   25 mg at 06/19/21 1411   fluticasone furoate-vilanterol (BREO ELLIPTA) 200-25 MCG/INH 1 puff  1 puff Inhalation Daily Clapacs, Madie Reno, MD       hydrOXYzine (ATARAX/VISTARIL) tablet 50 mg  50 mg Oral TID PRN Clapacs, Madie Reno, MD   50 mg at 06/19/21 1412   magnesium hydroxide (MILK OF MAGNESIA) suspension 30 mL  30 mL Oral Daily PRN Clapacs, Madie Reno, MD       simvastatin (ZOCOR) tablet 20 mg  20 mg Oral q1800 Clapacs, Madie Reno, MD       traZODone (DESYREL) tablet 100 mg  100 mg Oral QHS PRN Clapacs, Madie Reno, MD   100 mg at 06/18/21 2135   PTA Medications: Medications Prior to Admission  Medication Sig Dispense Refill Last Dose   albuterol (PROVENTIL HFA;VENTOLIN HFA) 108 (90 Base) MCG/ACT inhaler Inhale 2 puffs into the lungs every 6 (six) hours as needed for wheezing or shortness of breath. (Patient not taking: Reported on 01/24/2019) 1 Inhaler 2    amLODipine-benazepril (LOTREL) 10-20 MG capsule TAKE 1 CAPSULE BY MOUTH EVERY DAY IN THE MORNING      bicalutamide (CASODEX) 50 MG tablet  (Patient not taking: No sig reported)      BREO ELLIPTA 200-25 MCG/INH AEPB TAKE 1 PUFF BY MOUTH EVERY DAY**NEEDS OFFICE VISIT** (Patient not taking: No sig reported) 60 each 4    clonazePAM (KLONOPIN) 0.5 MG tablet TAKE 1 TABLET BY MOUTH THREE TIMES A DAY AS NEEDED FOR ANXIETY (Patient not taking: No sig reported) 60 tablet 1    fenofibrate (TRICOR) 145 MG tablet Take 145 mg by mouth daily.      fluticasone (FLONASE) 50 MCG/ACT nasal spray Place 2 sprays into  both nostrils daily as needed for allergies or rhinitis.      Loratadine 10 MG CAPS Take 1 tablet by mouth daily as needed (allergies).       sertraline (ZOLOFT) 100 MG tablet Take 100 mg by mouth every morning. (Patient not taking: No sig reported)      simvastatin (ZOCOR) 20 MG tablet Take 20 mg by mouth daily. (Patient not taking: No sig reported)      venlafaxine XR (EFFEXOR-XR) 150 MG 24 hr capsule TAKE 1 CAPSULE BY MOUTH EVERY DAY (Patient not taking: No sig reported) 90 capsule 4    Musculoskeletal: Strength & Muscle Tone: within normal limits Gait & Station: normal Patient leans: N/A  Psychiatric Specialty Exam:  Presentation  General Appearance: Casual; Fairly  Groomed  Eye Contact:Fair  Speech:Clear and Coherent; Normal Rate  Speech Volume:Normal  Handedness: Right  Mood and Affect  Mood:Depressed; Anxious  Affect:Congruent; Depressed; Tearful  Thought Process  Thought Processes:Coherent; Goal Directed; Linear  Duration of Psychotic Symptoms: No data recorded Past Diagnosis of Schizophrenia or Psychoactive disorder: No data recorded Descriptions of Associations:Intact  Orientation:Full (Time, Place and Person)  Thought Content:Logical  Hallucinations:Hallucinations: None  Ideas of Reference:None  Suicidal Thoughts:Suicidal Thoughts: No SI Passive Intent and/or Plan: Without Intent; Without Plan; Without Means to Carry Out; Without Access to Means  Homicidal Thoughts:Homicidal Thoughts: No  Sensorium  Memory:Immediate Good; Recent Good; Remote Fair  Judgment:Fair  Insight:Good  Executive Functions  Concentration: Good Attention Span:Good  [place] of Knowledge:Good  Language:Good  Psychomotor Activity  Psychomotor Activity:Psychomotor Activity: Normal  Assets  Assets:Communication Skills; Desire for Improvement; Housing; Resilience; Social Support  Sleep  Sleep:Sleep: Fair Number of Hours of Sleep: 6.5  Physical  Exam: Physical Exam Vitals and nursing note reviewed.  HENT:     Mouth/Throat:     Pharynx: Oropharynx is clear.  Eyes:     Pupils: Pupils are equal, round, and reactive to light.  Cardiovascular:     Rate and Rhythm: Normal rate.     Pulses: Normal pulses.  Genitourinary:    Comments: Hx. Prostate ca. Musculoskeletal:        General: Normal range of motion.     Cervical back: Normal range of motion.  Skin:    General: Skin is warm and dry.  Neurological:     General: No focal deficit present.     Mental Status: He is alert and oriented to person, place, and time.   Review of Systems  Constitutional:  Negative for chills, diaphoresis and fever.  HENT:  Negative for congestion and sore throat.   Respiratory:  Negative for cough, shortness of breath and wheezing.   Cardiovascular:  Negative for chest pain and palpitations.  Gastrointestinal:  Negative for abdominal pain, blood in stool, constipation, diarrhea, heartburn, melena, nausea and vomiting.  Genitourinary:  Negative for dysuria.       Hx. Prostate ca.  Musculoskeletal:  Negative for joint pain and myalgias.  Neurological:  Negative for dizziness, tingling, tremors, sensory change, speech change, focal weakness, seizures, loss of consciousness, weakness and headaches.  Endo/Heme/Allergies:        Allergies: NKDA  Psychiatric/Behavioral:  Positive for depression and memory loss (C/o of being forgetful). Negative for hallucinations, substance abuse and suicidal ideas. The patient is nervous/anxious and has insomnia.   Blood pressure 123/85, pulse 77, temperature 97.6 F (36.4 C), temperature source Oral, resp. rate 18, height 5\' 8"  (1.727 m), weight 83.9 kg, SpO2 97 %. Body mass index is 28.13 kg/m.  Treatment Plan Summary: Daily contact with patient to assess and evaluate symptoms and progress in treatment and Medication management.   Treatment Plan/Recommendations: 1. Admit for crisis management and stabilization,  estimated length of stay 3-5 days.   2. Medication management to reduce current symptoms to base line and improve the patient's overall level of functioning: See [facility] for plan of care.  Major depression. Initiated Prozac 10 mg po x once tomorrow (06-20-21). Initiated Prozac 20 mg po daily starting on (06-21-21). Discontinued Devenlafaxine ER (Pristiq) 25 mg po daily.  Bipolar disorder. Initiated Olanzapine 2.5 mg po x once today. Initiated Olanzapine 5 mg po Q hs.starting tomorrow (06-20-21). Discontinue Abilify 5 mg po daily as adjunct/to augment Pristiq.   Anxiety.  Decreased Vistaril  from 50 mg to Vistaril 25 mg po po tid prn. Changed Clonazepam 0.5 mg po to Q 12 hours.  Insomnia.  Continue Trazodone 100 mg po prn.  Labs ordered. CBC, TSH.  3. Treat medical problems as indicated (resumed meds). Amlodipine 10 mg-benazepril 20 mg po daily for HTN. Breo Ellipta 200-25 mcg 1 puff daily for COPD. Simvastatin 20 mg po Q evenings for high cholesterol.  Other prn medications: continue, Acetaminophen 650 mg po Q 6 hrs prn for pain/fever. Mylanta 30 ml po Q 4 hrs prn for indigestion.  MOM 30 ml po Q daily prn for constipation.  4. Develop treatment plan to decrease risk of relapse upon discharge and the need for readmission.  5. Psycho-social education regarding coping skills.  6. Health care follow up as needed for medical problems.  7. Review, reconcile, and reinstate any pertinent home medications for other health issues where appropriate. 8. Call for consults with hospitalist for any additional specialty patient care services as needed.   Observation Level/Precautions:  15 minute checks  Laboratory:   Per ED, current lab results reviewed, labs to be obtained: CBC, TSH  Psychotherapy: Group sessions   Medications: See [facility]   Consultations: As needed  Discharge Concerns: Safety, mood stability.  Estimated LOS: 3-5 days  Other: Admit to the 400-hall   Physician Treatment Plan  for Primary Diagnosis: Severe recurrent major depression without psychotic features ([place]) Long Term Goal(s): Improvement in symptoms so as ready for discharge  Short Term Goals: Ability to identify changes in lifestyle to reduce recurrence of condition will improve, Ability to verbalize feelings will improve, Ability to disclose and discuss suicidal ideas, and Ability to demonstrate self-control will improve  Physician Treatment Plan for Secondary Diagnosis: Principal Problem:   Severe recurrent major depression without psychotic features ([place]) Active Problems:   Panic disorder (episodic paroxysmal anxiety)  Long Term Goal(s): Improvement in symptoms so as ready for discharge  Short Term Goals: Ability to identify and develop effective coping behaviors will improve, Ability to maintain clinical measurements within normal limits will improve, Compliance with prescribed medications will improve, and Ability to identify triggers associated with substance abuse/mental health issues will improve  I certify that inpatient services furnished can reasonably be expected to improve the patient's condition.    Lindell Spar, NP, pmhnp, fnp-bc 10/14/20222:38 PM

## 2021-06-19 NOTE — BHH Counselor (Signed)
Adult Comprehensive Assessment  Patient ID: Gerald Boyer, male   DOB: [birth year], 60 y.o.   MRN: [id number]  Information Source: Information source: Patient  Current Stressors:  Patient states their primary concerns and needs for treatment are:: "I have depression, anxiety, and panic attacks" Patient states their goals for this hospitilization and ongoing recovery are:: "To get back on medications and get a therapist" Educational / Learning stressors: Pt reports having a 12th grade education Employment / Job issues: Pt reports being unemployed Family Relationships: Pt reports no stressors Museum/gallery curator / Lack of resources (include bankruptcy): Pt reports limited income by his spouse Housing / Lack of housing: Pt reports living with his girlfriend and her 73 year old son Physical health (include injuries & life threatening diseases): Pt reports having prostate cancer in 2019 Social relationships: Pt reports few social relationships Substance abuse: Pt reports drinking 2 beers daily Bereavement / Loss: Pt reports no stressors  Living/Environment/Situation:  Living Arrangements: Spouse/significant other, Children Living conditions (as described by patient or guardian): Rent/Home Who else lives in the home?: Girlfriend and her 91 year old son How long has patient lived in current situation?: 6 years What is atmosphere in current home: Comfortable, Supportive  Family History:  Marital status: Long term relationship Long term relationship, how long?: 9 years What types of issues is patient dealing with in the relationship?: None Are you sexually active?: Yes What is your sexual orientation?: Heterosexual Has your sexual activity been affected by drugs, alcohol, medication, or emotional stress?: No Does patient have children?: Yes How many children?: 3 How is patient's relationship with their children?: "I have 2 daughters and 1 son and I get along best with my son and ok with my  daughters"  Childhood History:  By whom was/is the patient raised?: Both parents Description of patient's relationship with caregiver when they were a child: "We got along well" Patient's description of current relationship with people who raised him/her: "We still get along really good" How were you disciplined when you got in trouble as a child/adolescent?: Spankings Does patient have siblings?: Yes Number of Siblings: 3 Description of patient's current relationship with siblings: "I have 2 brothers and 1 sister and we get along really well" Did patient suffer any verbal/emotional/physical/sexual abuse as a child?: No Did patient suffer from severe childhood neglect?: No Has patient ever been sexually abused/assaulted/raped as an adolescent or adult?: No Was the patient ever a victim of a crime or a disaster?: No Witnessed domestic violence?: No Has patient been affected by domestic violence as an adult?: No  Education:  Highest grade of school patient has completed: 12th grade and some college Currently a student?: No Learning disability?: No  Employment/Work Situation:   Employment Situation: Unemployed (Pt reports he was on a leave of absence from November 2021 to Feb 2022) Patient's Job has Been Impacted by Current Illness: Yes Describe how Patient's Job has Been Impacted: Pt reports he worries often and is not able to focus What is the Longest Time Patient has Held a Job?: 27 years Where was the Patient Employed at that Time?: [employer] Has Patient ever Been in the [employer]?: No  Financial Resources:   Museum/gallery curator resources: No income Does patient have a Programmer, applications or guardian?: No  Alcohol/Substance Abuse:   What has been your use of drugs/alcohol within the last 12 months?: Pt reports drinking 2 beers daily If attempted suicide, did drugs/alcohol play a role in this?: No Alcohol/Substance Abuse Treatment Hx: Denies  past history Has alcohol/substance abuse ever  caused legal problems?: No  Social Support System:   Patient's Community Support System: Fair Astronomer System: Girlfriend, son, sister Type of faith/religion: Darrick Meigs How does patient's faith help to cope with current illness?: Social worker and prayer  Leisure/Recreation:   Do You Have Hobbies?: Yes Leisure and Hobbies: Reading and watching sports  Strengths/Needs:   What is the patient's perception of their strengths?: Communication, singing, and being personable Patient states they can use these personal strengths during their treatment to contribute to their recovery: "I'm not sure" Patient states these barriers may affect/interfere with their treatment: None Patient states these barriers may affect their return to the community: None Other important information patient would like considered in planning for their treatment: None  Discharge Plan:   Currently receiving community mental health services: No Patient states concerns and preferences for aftercare planning are: Pt is interested in therapy and medication management that does not require insurance Patient states they will know when they are safe and ready for discharge when: "When I get medications and a therapy appointment" Does patient have access to transportation?: Yes (Pt reports having his own car at home) Does patient have financial barriers related to discharge medications?: Yes Patient description of barriers related to discharge medications: Limited income Will patient be returning to same living situation after discharge?: Yes  Summary/Recommendations:   Summary and Recommendations (to be completed by the evaluator): Gerald Boyer is a 60 year old, male, who was admitted to the hospital due to anxiety, worsening depression, and suicidal thoughts.  The Pt reports that his depression and anxiety have been worsening for several months since stopping his psychiatry appointments in Feburary of 2022.  He  reports that he has been unable to work since undergoing Prostate removal and Prostate Cancer from 2019 to 2022 .  He reports that he took FMLA from work from November 2021 until Feburary 2022 but has been unable to return due to worry and anxiety.  The Pt reports that he is living with his girlfriend and her 48 year old son.  He reports no childhood stressors and no family conflicts.  The Pt reports few social relationships and states that he often isolates at home.  The Pt reports drinking approximately 2 beers daily and denies any current or prior substance use treatment.  While in the hospital the Pt can benefit from crisis stabilization, medication evaluation, group therapy, psycho-education, case managment, and discharge planning.  Upon discharge the Pt would like to return to his home with his girlfriend and follow-up with [facility] in [place] for therapy and psychiatry.  Darleen Crocker. 06/19/2021

## 2021-06-20 LAB — TSH: TSH: 1.948 u[IU]/mL (ref 0.350–4.500)

## 2021-06-20 MED ORDER — HYDROXYZINE HCL 25 MG PO TABS
25.0000 mg | ORAL_TABLET | Freq: Three times a day (TID) | ORAL | Status: DC | PRN
  Filled 2021-06-20: qty 1

## 2021-06-20 NOTE — BHH Group Notes (Signed)
Pt attended afternoon group.

## 2021-06-20 NOTE — Group Note (Signed)
Group Topic: Goal Setting  Group Date: 06/20/2021 Start Time: 0900 End Time: 1000 Facilitators: Paulino Rily, RN  Department: [facility]  Number of Participants: 13  Group Focus: affirmation, clarity of thought, and goals/reality orientation Treatment Modality:  Psychoeducation Interventions utilized were assignment, group exercise, and support Purpose: To be able to understand and verbalize the reason for their admission to the hospital. To understand that the medication helps with their chemical imbalance but they also need to work on their choices in life. To be challenged to develop a list of 30 positives about themselves. Also introduce the concept that "feelings" are not reality.   Name: Gerald Boyer Date of Birth: 1960/11/09  MR: [id number]    Level of Participation: active Quality of Participation: cooperative Interactions with others: appropriate Mood/Affect: appropriate Triggers (if applicable):  Cognition: coherent/clear Progress: Moderate Response appropriate Plan: patient will be encouraged to attend all groups  Patients Problems:  Patient Active Problem List   Diagnosis Date Noted   GAD (generalized anxiety disorder) 06/19/2021   Severe recurrent major depression without psychotic features ([place]) 06/18/2021   Prostate cancer ([place]) 12/30/2017   Diverticulitis of large intestine with perforation without bleeding 11/28/2017   Obesity 08/23/2017   Elevated prostate specific antigen (PSA) 08/04/2015   Essential (primary) hypertension 08/04/2015   Ruptured tympanic membrane 08/04/2015   Extrapyramidal disease 08/04/2015   Hypertriglyceridemia 06/27/2014   History of colonic polyps 02/21/2012   Panic disorder (episodic paroxysmal anxiety) 09/06/2001   Allergic rhinitis due to pollen 09/06/1998

## 2021-06-20 NOTE — Progress Notes (Signed)
Adult Psychoeducational Group Note  Date:  06/20/2021 Time:  8:56 PM  Group Topic/Focus:  Wrap-Up Group:   The focus of this group is to help patients review their daily goal of treatment and discuss progress on daily workbooks.  Participation Level:  Active  Participation Quality:  Appropriate  Affect:  Appropriate  Cognitive:  Appropriate  Insight: Appropriate  Engagement in Group:  Engaged  Modes of Intervention:  Exploration  Additional Comments:  patient attended and participated in group tonight. He reports that today he had a visitor. He went outside, and attended group whichhe enjoyed  Debe Coder 06/20/2021, 8:56 PM

## 2021-06-20 NOTE — Progress Notes (Signed)
   06/20/21 0635  Vital Signs  Pulse Rate 85  BP 109/86  BP Location Right Arm  BP Method Automatic  Patient Position (if appropriate) Standing   D: Patient denies SI/HI/AVH. Pt. Rated anxiety 5/10 and depression 8/10.Pt. was out in open areas attended group inside and outside. A:  Patient took scheduled medicine.  Support and encouragement provided Routine safety checks conducted every 15 minutes. Patient  Informed to notify staff with any concerns.   R:  Safety maintained.

## 2021-06-20 NOTE — [facility] Group Notes (Signed)
Pt attended morning goals group.

## 2021-06-20 NOTE — Progress Notes (Signed)
[facility] MD Progress Note  06/20/2021 5:49 PM Gerald Boyer  MRN:  [id number] Subjective:  "I feel a little better today"   Objective: Patient is a 60 year old Caucasian male with hx of major depressive disorder & medical diagnosis that includes; s/p prostate cancer, HTN, Hyperlipidemia & COPD. [facility] with complaint of worsening symptoms of depression, anxiety/panic symptoms, poor concentration, excessive worrying, forgetfulness, decreased sleep & hopelessness & suicidal ideations. Patient apparently has battled depression most of his life & was most recently had received mental health treatment under the care of Dr. Nicolasa Ducking.  Evaluation on the unit today, 10/15: Patient was seen, chart reviewed and case discussed with the treatment team. Patient stated he feels a little better today than yesterday. He rated his anxiety as 4/10 and depression as 5/10 with 10 being the worst. He stated he feels he slept ok last night, record shows he slept 6.75 hours. He did take Trazodone 100 mg at bedtime. He stated his appetite is okay. 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 He lost his job and has been trying to find another and became obsessed with the prospect of running out of money and being homeless became too much for him. He stated he lives with his girlfriend and she is supportive. He is taking the Zyprexa, Prozac and Klonopin that he is on here and feels they are helpful. We discussed the increase in his Zyprexa tonight and Prozac tomorrow. He is aware to let staff know if he is having any issues with them. He appears sad and dysphoric. He is anxious. He is attending group and socializing with his peers. He denies SI/HI/AVH, paranoia and delusions. He contracts for safety on the unit.   Principal Problem: Severe recurrent major depression without psychotic features ([place]) Diagnosis: Principal Problem:   Severe recurrent major  depression without psychotic features ([place]) Active Problems:   Panic disorder (episodic paroxysmal anxiety)   GAD (generalized anxiety disorder)  Total Time spent with patient:  25 minutes  Past Psychiatric History:  Major depressive disorder  Past Medical History:  Past Medical History:  Diagnosis Date   Allergy    Anxiety    Cancer ([place])    Diverticulitis    Hyperlipidemia    Hypertension    Prostate cancer [facility])     Past Surgical History:  Procedure Laterality Date   LIPOMA EXCISION     located on  left shoulder   LYMPHADENECTOMY Bilateral 12/30/2017   Procedure: LYMPHADENECTOMY;  Surgeon: Alexis Frock, MD;  Location: [facility];  Service: Urology;  Laterality: Bilateral;   PROSTATE BIOPSY  01/2011   outpatient, Dr. Eliberto Ivory; 05/2011- mild chronic inflammation, no dysplasia   ROBOT ASSISTED LAPAROSCOPIC RADICAL PROSTATECTOMY N/A 12/30/2017   Procedure: XI ROBOTIC ASSISTED LAPAROSCOPIC RADICAL PROSTATECTOMY WITH PELVIC LYMPHADENECTOMY AND INJECTION OF INDOCYANINE GREEN DYE;  Surgeon: Alexis Frock, MD;  Location: [facility];  Service: Urology;  Laterality: N/A;   TONSILLECTOMY     VASECTOMY  1995   Family History:  Family History  Problem Relation Age of Onset   COPD Mother    Kidney cancer Mother    Prostate cancer Father        treated with radiation   Rectal cancer Daughter    Hypertension Other    Breast cancer Neg Hx    Colon cancer Neg Hx    Pancreatic cancer Neg Hx  Family Psychiatric  History: Bipolar disorder: Maternal grandmother Social History:  Social History   Substance and Sexual Activity  Alcohol Use Yes   Comment: 2-4 beers/day     Social History   Substance and Sexual Activity  Drug Use No   Comment: + benzos and MDMA    Social History   Socioeconomic History   Marital status: Single    Spouse name: IT sales professional   Number of children: 3   Years of education: Not on file   Highest education level: Not on file  Occupational History    Occupation: Inside sales  Tobacco Use   Smoking status: Never   Smokeless tobacco: Never  Vaping Use   Vaping Use: Never used  Substance and Sexual Activity   Alcohol use: Yes    Comment: 2-4 beers/day   Drug use: No    Comment: + benzos and MDMA   Sexual activity: Not Currently  Other Topics Concern   Not on file  Social History Narrative   Divorced. Has two daughters and one son. Resides in [place] with significant other [name].   Social Determinants of Health   Financial Resource Strain: Not on file  Food Insecurity: Not on file  Transportation Needs: Not on file  Physical Activity: Not on file  Stress: Not on file  Social Connections: Not on file   Additional Social History:    Divorced, has 3 children, unemployed, lives in [place], [place].     Sleep: Fair  Appetite:  Fair  Current Medications: Current Facility-Administered Medications  Medication Dose Route Frequency Provider Last Rate Last Admin   acetaminophen (TYLENOL) tablet 650 mg  650 mg Oral Q6H PRN Clapacs, John T, MD       alum & mag hydroxide-simeth (MAALOX/MYLANTA) 200-200-20 MG/5ML suspension 30 mL  30 mL Oral Q4H PRN Clapacs, John T, MD       amLODipine (NORVASC) tablet 10 mg  10 mg Oral Daily Clapacs, John T, MD   10 mg at 06/20/21 8315   And   benazepril (LOTENSIN) tablet 20 mg  20 mg Oral Daily Clapacs, John T, MD   20 mg at 06/20/21 1761   clonazePAM (KLONOPIN) tablet 0.5 mg  0.5 mg Oral Q12H Nwoko, Agnes I, NP   0.5 mg at 06/20/21 0844   [START ON 06/21/2021] FLUoxetine (PROZAC) capsule 20 mg  20 mg Oral Daily Nwoko, Agnes I, NP       fluticasone furoate-vilanterol (BREO ELLIPTA) 200-25 MCG/INH 1 puff  1 puff Inhalation Daily Clapacs, John T, MD       hydrOXYzine (ATARAX/VISTARIL) tablet 50 mg  50 mg Oral TID PRN Clapacs, John T, MD   50 mg at 06/19/21 1412   magnesium hydroxide (MILK OF MAGNESIA) suspension 30 mL  30 mL Oral Daily PRN Clapacs, John T, MD       OLANZapine (ZYPREXA) tablet 5 mg   5 mg Oral QHS Nwoko, Agnes I, NP       simvastatin (ZOCOR) tablet 20 mg  20 mg Oral q1800 Clapacs, John T, MD   20 mg at 06/19/21 1851   traZODone (DESYREL) tablet 100 mg  100 mg Oral QHS PRN Clapacs, Madie Reno, MD   100 mg at 06/19/21 2115    Lab Results:  Results for orders placed or performed during the hospital encounter of 06/18/21 (from the past 48 hour(s))  Hemoglobin A1c     Status: None   Collection Time: 06/19/21  6:19 AM  Result Value Ref  Range   Hgb A1c MFr Bld 5.5 4.8 - 5.6 %    Comment: (NOTE) Pre diabetes:          5.7%-6.4%  Diabetes:              >6.4%  Glycemic control for   <7.0% adults with diabetes    Mean Plasma Glucose 111.15 mg/dL    Comment: Performed at [facility] [address], [place], [place] [address]  Lipid panel     Status: Abnormal   Collection Time: 06/19/21  6:19 AM  Result Value Ref Range   Cholesterol 184 0 - 200 mg/dL   Triglycerides 76 <150 mg/dL   HDL 62 >40 mg/dL   Total CHOL/HDL Ratio 3.0 RATIO   VLDL 15 0 - 40 mg/dL   LDL Cholesterol 107 (H) 0 - 99 mg/dL    Comment:        Total Cholesterol/HDL:CHD Risk Coronary Heart Disease Risk Table                     Men   Women  1/2 Average Risk   3.4   3.3  Average Risk       5.0   4.4  2 X Average Risk   9.6   7.1  3 X Average Risk  23.4   11.0        Use the calculated Patient Ratio above and the CHD Risk Table to determine the patient's CHD Risk.        ATP III CLASSIFICATION (LDL):  <100     mg/dL   Optimal  100-129  mg/dL   Near or Above                    Optimal  130-159  mg/dL   Borderline  160-189  mg/dL   High  >190     mg/dL   Very High Performed at [facility] [address] [place], [place] [address]   CBC     Status: Abnormal   Collection Time: 06/19/21  6:42 PM  Result Value Ref Range   WBC 8.1 4.0 - 10.5 K/uL   RBC 5.84 (H) 4.22 - 5.81 MIL/uL   Hemoglobin 17.3 (H) 13.0 - 17.0 g/dL   HCT 53.1 (H) 39.0 - 52.0 %   MCV 90.9 80.0 -  100.0 fL   MCH 29.6 26.0 - 34.0 pg   MCHC 32.6 30.0 - 36.0 g/dL   RDW 12.6 11.5 - 15.5 %   Platelets 343 150 - 400 K/uL   nRBC 0.0 0.0 - 0.2 %    Comment: Performed at [facility], [place] [address], [place], [place] [address]  TSH     Status: None   Collection Time: 06/20/21  6:49 AM  Result Value Ref Range   TSH 1.948 0.350 - 4.500 uIU/mL    Comment: Performed by a 3rd Generation assay with a functional sensitivity of <=0.01 uIU/mL. Performed at [facility], [place] [address] [place], [place] [address]     Blood Alcohol level:  Lab Results  Component Value Date   ETH <10 97/35/3299    Metabolic Disorder Labs: Lab Results  Component Value Date   HGBA1C 5.5 06/19/2021   MPG 111.15 06/19/2021   No results found for: PROLACTIN Lab Results  Component Value Date   CHOL 184 06/19/2021   TRIG 76 06/19/2021   HDL 62 06/19/2021   CHOLHDL  3.0 06/19/2021   VLDL 15 06/19/2021   LDLCALC 107 (H) 06/19/2021   LDLCALC 129 (H) 08/23/2017    Physical Findings: AIMS: Facial and Oral Movements Muscles of Facial Expression: None, normal Lips and Perioral Area: None, normal Jaw: None, normal Tongue: None, normal,Extremity Movements Upper (arms, wrists, hands, fingers): None, normal Lower (legs, knees, ankles, toes): None, normal, Trunk Movements Neck, shoulders, hips: None, normal, Overall Severity Severity of abnormal movements (highest score from questions above): None, normal Incapacitation due to abnormal movements: None, normal Patient's awareness of abnormal movements (rate only patient's report): No Awareness, Dental Status Current problems with teeth and/or dentures?: No Does patient usually wear dentures?: No  CIWA:    COWS:     Musculoskeletal: Strength & Muscle Tone: within normal limits Gait & Station: normal Patient leans: N/A  Psychiatric Specialty Exam:  Presentation  General Appearance: Appropriate for Environment; Casual;  Fairly Groomed  Eye Contact:Good  Speech:Normal Rate; Clear and Coherent  Speech Volume:Normal  Handedness:Right  Mood and Affect  Mood:Anxious; Depressed  Affect:Congruent; Constricted; Depressed   Thought Process  Thought Processes:Linear  Descriptions of Associations:Intact  Orientation:Full (Time, Place and Person)  Thought Content:Logical  History of Schizophrenia/Schizoaffective disorder:No data recorded Duration of Psychotic Symptoms:No data recorded Hallucinations:Hallucinations: None  Ideas of Reference:None  Suicidal Thoughts:Suicidal Thoughts: Yes, Passive SI Passive Intent and/or Plan: Without Intent; Without Plan  Homicidal Thoughts:Homicidal Thoughts: No  Sensorium  Memory:Immediate Good; Recent Good; Remote Good  Judgment:Fair  Insight:Fair  Executive Functions  Concentration:Good  Attention Span:Good  Recall:Good  Fund of Knowledge:Good  Language:Good  Psychomotor Activity  Psychomotor Activity:Psychomotor Activity: Normal  Assets  Assets:Communication Skills; Desire for Improvement; Housing; Resilience; Social Support; Physical Health  Sleep  Sleep:Sleep: Fair Number of Hours of Sleep: 6.75  Physical Exam: Physical Exam Vitals and nursing note reviewed.  Constitutional:      Appearance: Normal appearance.  HENT:     Head: Normocephalic.  Pulmonary:     Effort: Pulmonary effort is normal.  Musculoskeletal:        General: Normal range of motion.     Cervical back: Normal range of motion.  Neurological:     General: No focal deficit present.     Mental Status: He is alert and oriented to person, place, and time.  Psychiatric:        Attention and Perception: Attention and perception normal. He does not perceive auditory or visual hallucinations.        Mood and Affect: Mood is anxious and depressed.        Speech: Speech normal.        Behavior: Behavior normal. Behavior is cooperative.        Thought Content: Thought  content normal. Thought content is not paranoid or delusional. Thought content does not include homicidal or suicidal ideation. Thought content does not include homicidal or suicidal plan.        Cognition and Memory: Cognition normal.   Review of Systems  Constitutional: Negative.  Negative for fever.  HENT: Negative.  Negative for congestion, sinus pain and sore throat.   Respiratory: Negative.  Negative for cough and shortness of breath.   Cardiovascular: Negative.  Negative for chest pain.  Gastrointestinal: Negative.   Genitourinary: Negative.   Musculoskeletal: Negative.   Neurological: Negative.   Psychiatric/Behavioral:  Positive for depression. The patient is nervous/anxious.    Blood pressure 96/80, pulse 95, temperature 98.2 F (36.8 C), temperature source Oral, resp. rate 18, height 5\' 8"  (1.727 m), weight  83.9 kg, SpO2 97 %. Body mass index is 28.13 kg/m.  Treatment Plan Summary: Daily contact with patient to assess and evaluate symptoms and progress in treatment and Medication management  Major depression. Discontinue Prozac 10 mg po  (06-20-21). Initiate Prozac 20 mg po daily starting on (06-21-21). Discontinued Devenlafaxine ER (Pristiq) 25 mg po daily.   Bipolar disorder. Discontinued Olanzapine 2.5 mg po x once 10/14. Initiate Olanzapine 5 mg po Q hs.starting tomorrow (06-20-21). Discontinued Abilify 5 mg po daily as adjunct/to augment Pristiq.    Anxiety.  Continue Vistaril 25 mg po tid prn. Continue Clonazepam 0.5 mg po to Q 12 hours.   Insomnia.  Continue Trazodone 100 mg po prn.   Labs reviewed today CBC with RBC 5.84, Hgb 17.3, HCT 53.1, TSH 1.948.   3. Treat medical problems as indicated (resumed meds). Amlodipine 10 mg-benazepril 20 mg po daily for HTN. Breo Ellipta 200-25 mcg 1 puff daily for COPD. Simvastatin 20 mg po Q evenings for high cholesterol.   Other prn medications: continue, Acetaminophen 650 mg po Q 6 hrs prn for  pain/fever. Mylanta 30 ml po Q 4 hrs prn for indigestion.  MOM 30 ml po Q daily prn for constipation.  4. Develop treatment plan to decrease risk of relapse upon discharge and the need for readmission.  5. Psycho-social education regarding coping skills.  6. Health care follow up as needed for medical problems.  7. Review, reconcile, and reinstate any pertinent home medications for other health issues where appropriate. 8. Call for consults with hospitalist for any additional specialty patient care services as needed.   Ethelene Hal, NP 06/20/2021, 6:02 PM

## 2021-06-21 NOTE — Group Note (Signed)
Group Topic: PROGRESSIVE RELAXATION. A group where deep breathing is taught and tensing and relaxation muscle groups is used. Imagery is used as well.  Pts are asked to imagine 3 pillars that hold them up when they are not able to hold themselves up. Group Date: 06/21/2021 Start Time: 0900 End Time: 1000 Facilitators: Paulino Rily, RN  Department: [facility] INPATIENT ADULT 300B  Number of Participants: 16  Group Focus: anxiety and clarity of thought Treatment Modality:  Cognitive Behavioral Therapy, Skills Training, and Spiritual Interventions utilized were exploration and support Purpose: enhance coping skills and reinforce self-care  Name: Gerald Boyer Date of Birth: 06-Aug-1961  MR: [id number]    Level of Participation: active Quality of Participation: cooperative Interactions with others: gave feedback Mood/Affect: appropriate Triggers (if applicable):  Cognition: goal directed Progress: Significant Response: Pt rates his energy at 5/10. States his three pillars are: his family, his faith and his life Plan: patient will be encouraged to Encourage Pt to continue to attend the groups and to participate  Patients Problems:  Patient Active Problem List   Diagnosis Date Noted   GAD (generalized anxiety disorder) 06/19/2021   Severe recurrent major depression without psychotic features ([place]) 06/18/2021   Prostate cancer ([place]) 12/30/2017   Diverticulitis of large intestine with perforation without bleeding 11/28/2017   Obesity 08/23/2017   Elevated prostate specific antigen (PSA) 08/04/2015   Essential (primary) hypertension 08/04/2015   Ruptured tympanic membrane 08/04/2015   Extrapyramidal disease 08/04/2015   Hypertriglyceridemia 06/27/2014   History of colonic polyps 02/21/2012   Panic disorder (episodic paroxysmal anxiety) 09/06/2001   Allergic rhinitis due to pollen 09/06/1998

## 2021-06-21 NOTE — Progress Notes (Signed)
   06/21/21 2102  Psych Admission Type (Psych Patients Only)  Admission Status Voluntary  Psychosocial Assessment  Patient Complaints None  Eye Contact Fair  Facial Expression Pensive  Affect Appropriate to circumstance  Speech Logical/coherent;Soft  Interaction Minimal  Motor Activity Slow  Appearance/Hygiene In scrubs  Behavior Characteristics Cooperative;Appropriate to situation  Mood Pleasant  Thought Process  Coherency WDL  Content WDL  Delusions None reported or observed  Perception WDL  Hallucination None reported or observed  Judgment Poor  Confusion None  Danger to Self  Current suicidal ideation? Denies  Self-Injurious Behavior No self-injurious ideation or behavior indicators observed or expressed   Agreement Not to Harm Self Yes  Description of Agreement Verbal Contract  Danger to Others  Danger to Others None reported or observed

## 2021-06-21 NOTE — [facility] Group Notes (Signed)
[place] Group Notes:  (Nursing/MHT/Case Management/Adjunct)  Date:  06/21/2021  Time:  9:09 PM  Type of Therapy:   wrap up   Participation Level:  Active  Participation Quality:  Appropriate and Attentive  Affect:  Appropriate  Cognitive:  Appropriate  Insight:  Appropriate, Good, and Improving  Engagement in Group:  Developing/Improving and Engaged  Modes of Intervention:  Discussion  Summary of Progress/Problems: Pt said he had a good day and that he got to see his son and he is looking forward to seeing what tomorrow brings and says he is doing a lot better.   Maxine Glenn 06/21/2021, 9:09 PM

## 2021-06-21 NOTE — Progress Notes (Signed)
  D: Patient is alert and oriented x 4. Patient denies SI/HI/ AVH and pain. Disposition is Calm and cooperative with appropriate affect. Verbally contracts for safety to this Probation officer.   A:  Pt was given scheduled medications. Pt was encourage to attend groups. Q 15 minute checks were done for safety. Pt was visualized interacting appropriately with others in the milieu. Pt was offered support and encouragement by this Probation officer. Pt is goal oriented and stated goal was reached for the day. Pt attended groups and interacts well with peers and staff. Pt is taking medication. Pt complied with scheduled medications. No signs of distress nor concerns reported by patient at present.Pt remains receptive to treatment and safety maintained on unit.    R: Will continue to monitor and assess. Safety maintained during this shift.       06/21/21 1700  Psych Admission Type (Psych Patients Only)  Admission Status Voluntary  Psychosocial Assessment  Patient Complaints None  Eye Contact Fair  Facial Expression Anxious;Sad;Worried  Affect Sad;Anxious;Depressed  Speech Logical/coherent;Soft  Interaction Forwards little;Minimal  Motor Activity Slow  Appearance/Hygiene In scrubs  Behavior Characteristics Cooperative  Mood Pleasant  Thought Process  Coherency WDL  Content Blaming self  Delusions None reported or observed  Perception WDL  Hallucination None reported or observed  Judgment Poor  Confusion None  Danger to Self  Current suicidal ideation? Denies  Self-Injurious Behavior No self-injurious ideation or behavior indicators observed or expressed   Agreement Not to Harm Self Yes  Description of Agreement verbal contract for safety  Danger to Others  Danger to Others None reported or observed

## 2021-06-21 NOTE — Group Note (Signed)
  [facility] LCSW Group Therapy Note  Date/Time:  06/21/2021 10:00AM-11:00AM  Type of Therapy and Topic:  Group Therapy:  Self-Care Wheel  Participation Level:  Active   Description of Group This process group involved patients discussing the importance of self-care in different areas of life (professional, personal, emotional, psychological, spiritual, and physical) in order to achieve healthy life balance.  The group talked about what self-care in each of those areas would constitute and then specifically listed how they want to provide themselves with improved self-care.  Therapeutic Goals Patient will learn how to break self-care down into various areas of life Patient will participate in generating ideas about healthy self-care options in each category Patients will be supportive of one another and receive support from others Patient will identify one healthy self-care activity to add to his/her life   Summary of Patient Progress:  The patient expressed that one thing he/she can share about him/herself is that he has 3 grandchildren.  Because others in group expressed understanding and also having grandchildren, this helped him/her to see how people are all connected in a variety of ways.  During the discussion about self-care, he/she shared freely and frequently.  He/She was able to identify ways in which self-care in different areas would be helpful.  Patient's insight was excellent.  Therapeutic Modalities Processing Psychoeducation   Selmer Dominion, LCSW 06/21/2021 3:21 PM

## 2021-06-21 NOTE — [facility] Group Notes (Signed)
The focus of this group is to help patients establish daily goals to achieve during treatment and discuss how the patient can incorporate goal setting into their daily lives to aide in recovery.  Pt attended morning goals group.

## 2021-06-21 NOTE — Group Note (Signed)
Late Note for 06/20/2021  Clinical Social Work Note  No group could be held this day due to low staffing.  Other licensed group was held.  Selmer Dominion, LCSW 06/20/2021

## 2021-06-21 NOTE — Progress Notes (Addendum)
[facility] MD Progress Note  06/21/2021 4:26 PM Add Dinapoli  MRN:  [id number] Subjective:  "I feel a little better today"   Objective: Patient is a 60 year old Caucasian male with hx of major depressive disorder & medical diagnosis that includes; s/p prostate cancer, HTN, Hyperlipidemia & COPD. Admitted to the [facility] from the [facility] with complaint of worsening symptoms of depression, anxiety/panic symptoms, poor concentration, excessive worrying, forgetfulness, decreased sleep & hopelessness & suicidal ideations. Patient apparently has battled depression most of his life & was most recently had received mental health treatment under the care of Dr. Nicolasa Ducking.  Evaluation on the unit today, 10/16: Patient was seen, chart reviewed and case discussed with the treatment team. Patient stated he feels a little better today than yesterday. He rated his anxiety as 5/10 and depression as 6/10 with 10 being the worst. He stated he feels he slept ok last night, record shows he slept 6.5 hours. He did take Trazodone 100 mg at bedtime. He stated his appetite is okay. 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 He lost his job and has been trying to find another and became obsessed with the prospect of running out of money and being homeless became too much for him. He stated he lives with his girlfriend and she is supportive. We discussed the benefits of therapy after discharge. He agrees this will be helpful but is worried about insurance. He stated "I feel like I had a better day yesterday but I still feel better today than when I came in." He is taking the Zyprexa, Prozac and Klonopin that he is on here and feels they are helpful. He is not having any issues with the increase in medications so far. He appears anxious and depressed but less so than yesterday. He is calm and cooperative, pleasant on approach.  He is attending group and socializing with his peers. He denies  SI/HI/AVH, paranoia and delusions. He contracts for safety on the unit.   Principal Problem: Severe recurrent major depression without psychotic features ([place]) Diagnosis: Principal Problem:   Severe recurrent major depression without psychotic features ([place]) Active Problems:   Panic disorder (episodic paroxysmal anxiety)   GAD (generalized anxiety disorder)  Total Time spent with patient:  25 minutes  Past Psychiatric History:  Major depressive disorder  Past Medical History:  Past Medical History:  Diagnosis Date   Allergy    Anxiety    Cancer ([place])    Diverticulitis    Hyperlipidemia    Hypertension    Prostate cancer [facility])     Past Surgical History:  Procedure Laterality Date   LIPOMA EXCISION     located on  left shoulder   LYMPHADENECTOMY Bilateral 12/30/2017   Procedure: LYMPHADENECTOMY;  Surgeon: Alexis Frock, MD;  Location: [facility] ORS;  Service: Urology;  Laterality: Bilateral;   PROSTATE BIOPSY  01/2011   outpatient, Dr. Eliberto Ivory; 05/2011- mild chronic inflammation, no dysplasia   ROBOT ASSISTED LAPAROSCOPIC RADICAL PROSTATECTOMY N/A 12/30/2017   Procedure: XI ROBOTIC ASSISTED LAPAROSCOPIC RADICAL PROSTATECTOMY WITH PELVIC LYMPHADENECTOMY AND INJECTION OF INDOCYANINE GREEN DYE;  Surgeon: Alexis Frock, MD;  Location: [facility] ORS;  Service: Urology;  Laterality: N/A;   TONSILLECTOMY     VASECTOMY  1995   Family History:  Family History  Problem Relation Age of Onset   COPD Mother    Kidney cancer Mother    Prostate cancer Father  treated with radiation   Rectal cancer Daughter    Hypertension Other    Breast cancer Neg Hx    Colon cancer Neg Hx    Pancreatic cancer Neg Hx    Family Psychiatric  History: Bipolar disorder: Maternal grandmother Social History:  Social History   Substance and Sexual Activity  Alcohol Use Yes   Comment: 2-4 beers/day     Social History   Substance and Sexual Activity  Drug Use No   Comment: + benzos and MDMA    Social  History   Socioeconomic History   Marital status: Single    Spouse name: IT sales professional   Number of children: 3   Years of education: Not on file   Highest education level: Not on file  Occupational History   Occupation: Inside sales  Tobacco Use   Smoking status: Never   Smokeless tobacco: Never  Vaping Use   Vaping Use: Never used  Substance and Sexual Activity   Alcohol use: Yes    Comment: 2-4 beers/day   Drug use: No    Comment: + benzos and MDMA   Sexual activity: Not Currently  Other Topics Concern   Not on file  Social History Narrative   Divorced. Has two daughters and one son. Resides in [place] with significant other [name].   Social Determinants of Health   Financial Resource Strain: Not on file  Food Insecurity: Not on file  Transportation Needs: Not on file  Physical Activity: Not on file  Stress: Not on file  Social Connections: Not on file   Additional Social History:    Divorced, has 3 children, unemployed, lives in [place], [place].     Sleep: Fair  Appetite:  Fair  Current Medications: Current Facility-Administered Medications  Medication Dose Route Frequency Provider Last Rate Last Admin   acetaminophen (TYLENOL) tablet 650 mg  650 mg Oral Q6H PRN Clapacs, Madie Reno, MD       alum & mag hydroxide-simeth (MAALOX/MYLANTA) 200-200-20 MG/5ML suspension 30 mL  30 mL Oral Q4H PRN Clapacs, John T, MD       amLODipine (NORVASC) tablet 10 mg  10 mg Oral Daily Clapacs, John T, MD   10 mg at 06/21/21 6073   And   benazepril (LOTENSIN) tablet 20 mg  20 mg Oral Daily Clapacs, John T, MD   20 mg at 06/21/21 0813   clonazePAM (KLONOPIN) tablet 0.5 mg  0.5 mg Oral Q12H Nwoko, Agnes I, NP   0.5 mg at 06/21/21 0813   FLUoxetine (PROZAC) capsule 20 mg  20 mg Oral Daily Nwoko, Herbert Pun I, NP   20 mg at 06/21/21 0813   fluticasone furoate-vilanterol (BREO ELLIPTA) 200-25 MCG/INH 1 puff  1 puff Inhalation Daily Clapacs, John T, MD       hydrOXYzine (ATARAX/VISTARIL)  tablet 25 mg  25 mg Oral TID PRN Ethelene Hal, NP       magnesium hydroxide (MILK OF MAGNESIA) suspension 30 mL  30 mL Oral Daily PRN Clapacs, John T, MD       OLANZapine (ZYPREXA) tablet 5 mg  5 mg Oral QHS Nwoko, Agnes I, NP   5 mg at 06/20/21 2141   simvastatin (ZOCOR) tablet 20 mg  20 mg Oral q1800 Clapacs, John T, MD   20 mg at 06/20/21 2141   traZODone (DESYREL) tablet 100 mg  100 mg Oral QHS PRN Clapacs, Madie Reno, MD   100 mg at 06/20/21 2141    Lab Results:  Results  for orders placed or performed during the hospital encounter of 06/18/21 (from the past 48 hour(s))  CBC     Status: Abnormal   Collection Time: 06/19/21  6:42 PM  Result Value Ref Range   WBC 8.1 4.0 - 10.5 K/uL   RBC 5.84 (H) 4.22 - 5.81 MIL/uL   Hemoglobin 17.3 (H) 13.0 - 17.0 g/dL   HCT 53.1 (H) 39.0 - 52.0 %   MCV 90.9 80.0 - 100.0 fL   MCH 29.6 26.0 - 34.0 pg   MCHC 32.6 30.0 - 36.0 g/dL   RDW 12.6 11.5 - 15.5 %   Platelets 343 150 - 400 K/uL   nRBC 0.0 0.0 - 0.2 %    Comment: Performed at [facility], [place] [address], [place], [place] [address]  TSH     Status: None   Collection Time: 06/20/21  6:49 AM  Result Value Ref Range   TSH 1.948 0.350 - 4.500 uIU/mL    Comment: Performed by a 3rd Generation assay with a functional sensitivity of <=0.01 uIU/mL. Performed at [facility], [place] [address] [place], [place] [address]     Blood Alcohol level:  Lab Results  Component Value Date   ETH <10 28/76/8115    Metabolic Disorder Labs: Lab Results  Component Value Date   HGBA1C 5.5 06/19/2021   MPG 111.15 06/19/2021   No results found for: PROLACTIN Lab Results  Component Value Date   CHOL 184 06/19/2021   TRIG 76 06/19/2021   HDL 62 06/19/2021   CHOLHDL 3.0 06/19/2021   VLDL 15 06/19/2021   LDLCALC 107 (H) 06/19/2021   LDLCALC 129 (H) 08/23/2017    Physical Findings: AIMS: Facial and Oral Movements Muscles of Facial Expression: None,  normal Lips and Perioral Area: None, normal Jaw: None, normal Tongue: None, normal,Extremity Movements Upper (arms, wrists, hands, fingers): None, normal Lower (legs, knees, ankles, toes): None, normal, Trunk Movements Neck, shoulders, hips: None, normal, Overall Severity Severity of abnormal movements (highest score from questions above): None, normal Incapacitation due to abnormal movements: None, normal Patient's awareness of abnormal movements (rate only patient's report): No Awareness, Dental Status Current problems with teeth and/or dentures?: No Does patient usually wear dentures?: No  CIWA:    COWS:     Musculoskeletal: Strength & Muscle Tone: within normal limits Gait & Station: normal Patient leans: N/A  Psychiatric Specialty Exam:  Presentation  General Appearance: Appropriate for Environment; Casual; Fairly Groomed  Eye Contact:Good  Speech:Normal Rate; Clear and Coherent  Speech Volume:Normal  Handedness:Right  Mood and Affect  Mood:Anxious; Depressed  Affect:Congruent; Constricted; Depressed   Thought Process  Thought Processes:Linear  Descriptions of Associations:Intact  Orientation:Full (Time, Place and Person)  Thought Content:Logical  History of Schizophrenia/Schizoaffective disorder:No data recorded Duration of Psychotic Symptoms:No data recorded Hallucinations:No data recorded  Ideas of Reference:None  Suicidal Thoughts:No data recorded  Homicidal Thoughts:No data recorded  Sensorium  Memory:Immediate Good; Recent Good; Remote Good  Judgment:Fair  Insight:Fair  Executive Functions  Concentration:Good  Attention Span:Good  [place] of Knowledge:Good  Language:Good  Psychomotor Activity  Psychomotor Activity:No data recorded  Assets  Assets:Communication Skills; Desire for Improvement; Housing; Resilience; Social Support; Physical Health  Sleep  Sleep:No data recorded  Physical Exam: Physical Exam Vitals  and nursing note reviewed.  Constitutional:      Appearance: Normal appearance.  HENT:     Head: Normocephalic.  Pulmonary:     Effort: Pulmonary effort is normal.  Musculoskeletal:  General: Normal range of motion.     Cervical back: Normal range of motion.  Neurological:     General: No focal deficit present.     Mental Status: He is alert and oriented to person, place, and time.  Psychiatric:        Attention and Perception: Attention and perception normal. He does not perceive auditory or visual hallucinations.        Mood and Affect: Mood is anxious and depressed.        Speech: Speech normal.        Behavior: Behavior normal. Behavior is cooperative.        Thought Content: Thought content normal. Thought content is not paranoid or delusional. Thought content does not include homicidal or suicidal ideation. Thought content does not include homicidal or suicidal plan.        Cognition and Memory: Cognition normal.   Review of Systems  Constitutional: Negative.  Negative for fever.  HENT: Negative.  Negative for congestion, sinus pain and sore throat.   Respiratory: Negative.  Negative for cough and shortness of breath.   Cardiovascular: Negative.  Negative for chest pain.  Gastrointestinal: Negative.   Genitourinary: Negative.   Musculoskeletal: Negative.   Neurological: Negative.   Psychiatric/Behavioral:  Positive for depression. The patient is nervous/anxious.    Blood pressure 117/81, pulse 79, temperature 98.4 F (36.9 C), temperature source Oral, resp. rate 18, height 5\' 8"  (1.727 m), weight 83.9 kg, SpO2 98 %. Body mass index is 28.13 kg/m.  Treatment Plan Summary: Daily contact with patient to assess and evaluate symptoms and progress in treatment and Medication management  Major depression. Discontinue Prozac 10 mg po  (06-20-21). Initiate Prozac 20 mg po daily starting on (06-21-21). Discontinued Devenlafaxine ER (Pristiq) 25 mg po daily.   Bipolar  disorder. Discontinued Olanzapine 2.5 mg po x once 10/14. Initiate Olanzapine 5 mg po Q hs.starting tomorrow (06-20-21). Discontinued Abilify 5 mg po daily as adjunct/to augment Pristiq.    Anxiety.  Continue Vistaril 25 mg po tid prn. Continue Clonazepam 0.5 mg po to Q 12 hours.   Insomnia.  Continue Trazodone 100 mg po prn.   Labs reviewed 10/15 CBC with RBC 5.84, Hgb 17.3, HCT 53.1, TSH 1.948.   Treat medical problems as indicated (resumed meds). Amlodipine 10 mg-benazepril 20 mg po daily for HTN. Breo Ellipta 200-25 mcg 1 puff daily for COPD. Simvastatin 20 mg po Q evenings for high cholesterol.   Other prn medications: continue, Acetaminophen 650 mg po Q 6 hrs prn for pain/fever. Mylanta 30 ml po Q 4 hrs prn for indigestion.  MOM 30 ml po Q daily prn for constipation.  Develop a treatment plan to decrease risk of relapse upon discharge and the need for readmission.  Suggest Health care follow up as needed for medical problems.  Call for consults with hospitalist for any additional specialty patient care services as needed  Continue every 15 minute safety checks Encourage participation in the therapeutic milieu Discharge planning in progress  Ethelene Hal, NP 06/21/2021, 4:39 PM I have reviewed the patient's chart and discussed the case with the APP. I agree with the assessment and plan of care as documented in the APP's note.  Viann Fish, MD, [facility]

## 2021-06-21 NOTE — [facility] Group Notes (Signed)
Pt attended the relaxation group this afternoon.

## 2021-06-22 MED ORDER — SIMVASTATIN 20 MG PO TABS
20.0000 mg | ORAL_TABLET | Freq: Every day | ORAL | Status: DC
  Administered 2021-06-22 – 2021-06-28 (×7): 20 mg via ORAL
  Filled 2021-06-22 (×8): qty 1

## 2021-06-22 MED ORDER — FLUOXETINE HCL 20 MG PO CAPS
40.0000 mg | ORAL_CAPSULE | Freq: Every day | ORAL | Status: DC
  Administered 2021-06-25: 40 mg via ORAL
  Filled 2021-06-22 (×3): qty 2

## 2021-06-22 MED ORDER — FLUOXETINE HCL 20 MG PO CAPS
30.0000 mg | ORAL_CAPSULE | Freq: Every day | ORAL | Status: AC
  Administered 2021-06-23 – 2021-06-24 (×2): 30 mg via ORAL
  Filled 2021-06-22 (×2): qty 1

## 2021-06-22 NOTE — Group Note (Signed)
Recreation Therapy Group Note   Group Topic:Stress Management  Group Date: 06/22/2021 Start Time: 0935 End Time: 0950 Facilitators: Victorino Sparrow, LRT/CTRS Location: [address] Dayroom  Goal Area(s) Addresses:  Patient will actively participate in stress management techniques presented during session.  Patient will successfully identify benefit of practicing stress management post d/c.   Group Description: Guided Imagery. LRT provided education, instruction, and demonstration on practice of visualization via guided imagery. Patient was asked to participate in the technique introduced during session. LRT debriefed including topics of mindfulness, stress management and specific scenarios each patient could use these techniques. Patients were given suggestions of ways to access scripts post d/c and encouraged to explore Youtube and other apps available on smartphones, tablets, and computers.   Affect/Mood: Appropriate   Participation Level: Engaged   Participation Quality: Independent   Behavior: Appropriate   Speech/Thought Process: Focused   Insight: Good   Judgement: Good   Modes of Intervention: Soft Sounds, Script   Patient Response to Interventions:  Engaged   Education Outcome:  Acknowledges education and In group clarification offered    Clinical Observations/Individualized Feedback: Pt attended and participated in group.     Plan: Continue to engage patient in RT group sessions 2-3x/week.   Victorino Sparrow, LRT/CTRS 06/22/2021 1:10 PM

## 2021-06-22 NOTE — [facility] Suicide Risk Assessment (Signed)
[place] INPATIENT:  Family/Significant Other Suicide Prevention Education  Suicide Prevention Education:  Education Completed; Gerald Boyer [phone] (Girlfriend) has been identified by the patient as the family member/significant other with whom the patient will be residing, and identified as the person(s) who will aid the patient in the event of a mental health crisis (suicidal ideations/suicide attempt).  With written consent from the patient, the family member/significant other has been provided the following suicide prevention education, prior to the and/or following the discharge of the patient.  The suicide prevention education provided includes the following: Suicide risk factors Suicide prevention and interventions National Suicide Hotline telephone number [facility] assessment telephone number [facility] Emergency Assistance [place] and/or Residential Mobile Crisis Unit telephone number  Request made of family/significant other to: Remove weapons (e.g., guns, rifles, knives), all items previously/currently identified as safety concern.   Remove drugs/medications (over-the-counter, prescriptions, illicit drugs), all items previously/currently identified as a safety concern.  The family member/significant other verbalizes understanding of the suicide prevention education information provided.  The family member/significant other agrees to remove the items of safety concern listed above.  CSW spoke with Ms. Marylen Ponto who states that she believes her boyfriend had "a nervous breakdown".  She states that he had a "bad divorce 12 years ago, his children stopped talking to him for several years, he had Cancer, and he has not worked in 9 months".  Ms. Marylen Ponto states that her boyfriend can come back to live with her after discharge and that there are no weapons or firearms in the home. She states that she has scheduled an appointment with a Doristine Bosworth for [employer].  She states that the [place] appointment will also be helpful because her boyfriend told her during visitation this weekend that the groups here in the hospital are helpful to him.  She states that she will make sure that her boyfriend goes to [facility] and groups.  CSW completed SPE with Ms. Marylen Ponto.   Gerald Boyer 06/22/2021, 11:22 AM

## 2021-06-22 NOTE — Group Note (Signed)
Occupational Therapy Group Note  Group Topic:Communication  Group Date: 06/22/2021 Start Time: 1400 End Time: 1445 Facilitators: Ponciano Ort, OT/L   Group Description: Group encouraged increased engagement and participation through discussion focused on communication styles. Patients were educated on the different styles of communication including passive, aggressive, assertive, and passive-aggressive communication. Group members shared and reflected on which styles they most often find themselves communicating in and brainstormed strategies on how to transition and practice a more assertive approach. Further discussion explored how to use assertiveness skills and strategies to further advocate and ask questions as it relates to their treatment plan and mental health.   Therapeutic Goal(s): Identify practical strategies to improve communication skills  Identify how to use assertive communication skills to address individual needs and wants   Participation Level: OT Group not held d/t high volume of individual OT/PT orders that needed to be seen.    Plan: Continue to engage patient in OT groups 2 - 3x/week.  06/22/2021  Ponciano Ort, OT/L

## 2021-06-22 NOTE — Group Note (Signed)
LCSW Group Therapy Note   Group Date: 06/22/2021 Start Time: 1300 End Time: 1400   Type of Therapy and Topic:  Group Therapy: Challenging Core Beliefs  Participation Level:  Minimal  Description of Group:  Patients were educated about core beliefs and asked to identify one harmful core belief that they have. Patients were asked to explore from where those beliefs originate. Patients were asked to discuss how those beliefs make them feel and the resulting behaviors of those beliefs. They were then be asked if those beliefs are true and, if so, what evidence they have to support them. Lastly, group members were challenged to replace those negative core beliefs with helpful beliefs.   Therapeutic Goals:   1. Patient will identify harmful core beliefs and explore the origins of such beliefs. 2. Patient will identify feelings and behaviors that result from those core beliefs. 3. Patient will discuss whether such beliefs are true. 4.  Patient will replace harmful core beliefs with helpful ones.  Summary of Patient Progress:  Gerald Boyer minimally engaged in processing and exploring how core beliefs are formed and how they impact thoughts, feelings, and behaviors. Patient proved open to input from peers and feedback from Gerald Boyer. Patient reports that his son inspires him due to his maturity.  Patient demonstrated good insight into the subject matter, was respectful and supportive of peers, and participated throughout the entire session.  Therapeutic Modalities: Cognitive Behavioral Therapy; Solution-Focused Therapy   Gerald Conch, LCSW 06/22/2021  1:58 PM

## 2021-06-22 NOTE — Progress Notes (Signed)
[facility] MD Progress Note  06/22/2021 10:04 AM Gerald Boyer  MRN:  [id number] Subjective:  "I feel a little better today but still worrying about everything"   Objective: Patient is a 60 year old Caucasian male with hx of major depressive disorder & medical diagnosis that includes; s/p prostate cancer, HTN, Hyperlipidemia & COPD. Admitted to the [facility] from the [facility] with complaint of worsening symptoms of depression, anxiety/panic symptoms, poor concentration, excessive worrying, forgetfulness, decreased sleep & hopelessness & suicidal ideations. Patient apparently has battled depression most of his life & was most recently had received mental health treatment under the care of Dr. Nicolasa Ducking.  Evaluation on the unit today, 10/17: Patient was seen, chart reviewed and case discussed with the treatment team. Patient stated he feels a little better today than yesterday. He rated his anxiety as 5/10 and depression as 6/10 with 10 being the worst. He stated he feels he slept pretty well last night, record shows he slept 6.75 hours. He had Trazodone 100 mg at bedtime. He stated that his door opening every few minutes kept waking him up. He stated his appetite is okay. He was sitting in the dayroom when I approached him today. He appeared to be preoccupied with his thoughts. He stated there is a continuous loop of worry in his mind. We will increase his Prozac to 30 mg for 2 days, then increase to 40 mg daily. He appears depressed and anxious. We discussed protective factors today. He has 2 daughters and a son, 3 grandchildren and one on the way. He has a supportive girlfriend.  We also discussed the benefits of therapy after discharge.  He is taking his Zyprexa, Prozac and Klonopin th and is open to medication adjustments. He stated I had a good day the other day but yesterday was not that good. We talked about the medication needing time to work and that we will continue to make needed adjustments to target his  symptoms. He is calm and cooperative, pleasant on approach.  He is attending group and socializing with his peers. He denies SI/HI/AVH, paranoia and delusions. His vital signs are stable. He contracts for safety on the unit.   Principal Problem: Severe recurrent major depression without psychotic features ([place]) Diagnosis: Principal Problem:   Severe recurrent major depression without psychotic features ([place]) Active Problems:   Panic disorder (episodic paroxysmal anxiety)   GAD (generalized anxiety disorder)  Total Time spent with patient:  25 minutes  Past Psychiatric History:  Major depressive disorder  Past Medical History:  Past Medical History:  Diagnosis Date   Allergy    Anxiety    Cancer ([place])    Diverticulitis    Hyperlipidemia    Hypertension    Prostate cancer [facility])     Past Surgical History:  Procedure Laterality Date   LIPOMA EXCISION     located on  left shoulder   LYMPHADENECTOMY Bilateral 12/30/2017   Procedure: LYMPHADENECTOMY;  Surgeon: Alexis Frock, MD;  Location: [facility] ORS;  Service: Urology;  Laterality: Bilateral;   PROSTATE BIOPSY  01/2011   outpatient, Dr. Eliberto Ivory; 05/2011- mild chronic inflammation, no dysplasia   ROBOT ASSISTED LAPAROSCOPIC RADICAL PROSTATECTOMY N/A 12/30/2017   Procedure: XI ROBOTIC ASSISTED LAPAROSCOPIC RADICAL PROSTATECTOMY WITH PELVIC LYMPHADENECTOMY AND INJECTION OF INDOCYANINE GREEN DYE;  Surgeon: Alexis Frock, MD;  Location: [facility];  Service: Urology;  Laterality: N/A;   TONSILLECTOMY     VASECTOMY  1995   Family History:  Family History  Problem Relation Age of  Onset   COPD Mother    Kidney cancer Mother    Prostate cancer Father        treated with radiation   Rectal cancer Daughter    Hypertension Other    Breast cancer Neg Hx    Colon cancer Neg Hx    Pancreatic cancer Neg Hx    Family Psychiatric  History: Bipolar disorder: Maternal grandmother Social History:  Social History   Substance and Sexual Activity   Alcohol Use Yes   Comment: 2-4 beers/day     Social History   Substance and Sexual Activity  Drug Use No   Comment: + benzos and MDMA    Social History   Socioeconomic History   Marital status: Single    Spouse name: IT sales professional   Number of children: 3   Years of education: Not on file   Highest education level: Not on file  Occupational History   Occupation: Inside sales  Tobacco Use   Smoking status: Never   Smokeless tobacco: Never  Vaping Use   Vaping Use: Never used  Substance and Sexual Activity   Alcohol use: Yes    Comment: 2-4 beers/day   Drug use: No    Comment: + benzos and MDMA   Sexual activity: Not Currently  Other Topics Concern   Not on file  Social History Narrative   Divorced. Has two daughters and one son. Resides in [place] with significant other [name].   Social Determinants of Health   Financial Resource Strain: Not on file  Food Insecurity: Not on file  Transportation Needs: Not on file  Physical Activity: Not on file  Stress: Not on file  Social Connections: Not on file   Additional Social History:    Divorced, has 3 children, unemployed, lives in [place], [place].     Sleep: Fair  Appetite:  Fair  Current Medications: Current Facility-Administered Medications  Medication Dose Route Frequency Provider Last Rate Last Admin   acetaminophen (TYLENOL) tablet 650 mg  650 mg Oral Q6H PRN Clapacs, John T, MD       alum & mag hydroxide-simeth (MAALOX/MYLANTA) 200-200-20 MG/5ML suspension 30 mL  30 mL Oral Q4H PRN Clapacs, John T, MD       amLODipine (NORVASC) tablet 10 mg  10 mg Oral Daily Clapacs, John T, MD   10 mg at 06/22/21 0747   And   benazepril (LOTENSIN) tablet 20 mg  20 mg Oral Daily Clapacs, John T, MD   20 mg at 06/22/21 0748   clonazePAM (KLONOPIN) tablet 0.5 mg  0.5 mg Oral Q12H Nwoko, Agnes I, NP   0.5 mg at 06/22/21 0748   FLUoxetine (PROZAC) capsule 20 mg  20 mg Oral Daily Nwoko, Herbert Pun I, NP   20 mg at 06/22/21 0747    fluticasone furoate-vilanterol (BREO ELLIPTA) 200-25 MCG/INH 1 puff  1 puff Inhalation Daily Clapacs, John T, MD       hydrOXYzine (ATARAX/VISTARIL) tablet 25 mg  25 mg Oral TID PRN Ethelene Hal, NP       magnesium hydroxide (MILK OF MAGNESIA) suspension 30 mL  30 mL Oral Daily PRN Clapacs, John T, MD       OLANZapine (ZYPREXA) tablet 5 mg  5 mg Oral QHS Nwoko, Agnes I, NP   5 mg at 06/21/21 2102   simvastatin (ZOCOR) tablet 20 mg  20 mg Oral q1800 Clapacs, John T, MD   20 mg at 06/21/21 1730   traZODone (DESYREL) tablet 100 mg  100 mg Oral QHS PRN Clapacs, Madie Reno, MD   100 mg at 06/21/21 2102    Lab Results:  No results found for this or any previous visit (from the past 48 hour(s)).   Blood Alcohol level:  Lab Results  Component Value Date   ETH <10 15/40/0867    Metabolic Disorder Labs: Lab Results  Component Value Date   HGBA1C 5.5 06/19/2021   MPG 111.15 06/19/2021   No results found for: PROLACTIN Lab Results  Component Value Date   CHOL 184 06/19/2021   TRIG 76 06/19/2021   HDL 62 06/19/2021   CHOLHDL 3.0 06/19/2021   VLDL 15 06/19/2021   LDLCALC 107 (H) 06/19/2021   LDLCALC 129 (H) 08/23/2017    Physical Findings: AIMS: Facial and Oral Movements Muscles of Facial Expression: None, normal Lips and Perioral Area: None, normal Jaw: None, normal Tongue: None, normal,Extremity Movements Upper (arms, wrists, hands, fingers): None, normal Lower (legs, knees, ankles, toes): None, normal, Trunk Movements Neck, shoulders, hips: None, normal, Overall Severity Severity of abnormal movements (highest score from questions above): None, normal Incapacitation due to abnormal movements: None, normal Patient's awareness of abnormal movements (rate only patient's report): No Awareness, Dental Status Current problems with teeth and/or dentures?: No Does patient usually wear dentures?: No  CIWA:    COWS:     Musculoskeletal: Strength & Muscle Tone: within normal  limits Gait & Station: normal Patient leans: N/A  Psychiatric Specialty Exam:  Presentation  General Appearance: Appropriate for Environment; Casual; Fairly Groomed  Eye Contact:Good  Speech:Normal Rate; Clear and Coherent  Speech Volume:Normal  Handedness:Right  Mood and Affect  Mood:Anxious; Depressed  Affect:Congruent; Constricted; Depressed   Thought Process  Thought Processes:Linear  Descriptions of Associations:Intact  Orientation:Full (Time, Place and Person)  Thought Content:Logical  History of Schizophrenia/Schizoaffective disorder:No data recorded Duration of Psychotic Symptoms:No data recorded Hallucinations:No data recorded  Ideas of Reference:None  Suicidal Thoughts:No data recorded  Homicidal Thoughts:No data recorded  Sensorium  Memory:Immediate Good; Recent Good; Remote Good  Judgment:Fair  Insight:Fair  Executive Functions  Concentration:Good  Attention Span:Good  [place] of Knowledge:Good  Language:Good  Psychomotor Activity  Psychomotor Activity:No data recorded  Assets  Assets:Communication Skills; Desire for Improvement; Housing; Resilience; Social Support; Physical Health  Sleep  Sleep:No data recorded  Physical Exam: Physical Exam Vitals and nursing note reviewed.  Constitutional:      Appearance: Normal appearance.  HENT:     Head: Normocephalic.  Pulmonary:     Effort: Pulmonary effort is normal.  Musculoskeletal:        General: Normal range of motion.     Cervical back: Normal range of motion.  Neurological:     General: No focal deficit present.     Mental Status: He is alert and oriented to person, place, and time.  Psychiatric:        Attention and Perception: Attention and perception normal. He does not perceive auditory or visual hallucinations.        Mood and Affect: Mood is anxious and depressed.        Speech: Speech normal.        Behavior: Behavior normal. Behavior is cooperative.         Thought Content: Thought content normal. Thought content is not paranoid or delusional. Thought content does not include homicidal or suicidal ideation. Thought content does not include homicidal or suicidal plan.        Cognition and Memory: Cognition normal.   Review of  Systems  Constitutional: Negative.  Negative for fever.  HENT: Negative.  Negative for congestion, sinus pain and sore throat.   Respiratory: Negative.  Negative for cough and shortness of breath.   Cardiovascular: Negative.  Negative for chest pain.  Gastrointestinal: Negative.   Genitourinary: Negative.   Musculoskeletal: Negative.   Neurological: Negative.   Psychiatric/Behavioral:  Positive for depression. The patient is nervous/anxious.    Blood pressure 115/81, pulse 87, temperature 97.9 F (36.6 C), temperature source Oral, resp. rate 20, height 5\' 8"  (1.727 m), weight 83.9 kg, SpO2 94 %. Body mass index is 28.13 kg/m.  Treatment Plan Summary: Daily contact with patient to assess and evaluate symptoms and progress in treatment and Medication management  Major depression. Increase Prozac to 30 mg PO daily x 2 days starting 06/23/21, then  Increase Prozac to 40 mg PO daily  Bipolar disorder. Continue Olanzapine 5 mg po Q hs.starting tomorrow (06-20-21).   Anxiety.  Continue Vistaril 25 mg po tid prn. Continue Clonazepam 0.5 mg po to Q 12 hours.   Insomnia.  Continue Trazodone 100 mg po prn.   Labs reviewed 10/15 CBC with RBC 5.84, Hgb 17.3, HCT 53.1, TSH 1.948.   Treat medical problems as indicated (resumed meds). Amlodipine 10 mg-benazepril 20 mg po daily for HTN. Breo Ellipta 200-25 mcg 1 puff daily for COPD. Simvastatin 20 mg po Q evenings for high cholesterol.   Other prn medications: continue, Acetaminophen 650 mg po Q 6 hrs prn for pain/fever. Mylanta 30 ml po Q 4 hrs prn for indigestion.  MOM 30 ml po Q daily prn for constipation.  Develop a treatment plan to decrease risk of relapse  upon discharge and the need for readmission.  Suggest Health care follow up as needed for medical problems.  Call for consults with hospitalist for any additional specialty patient care services as needed  Continue every 15 minute safety checks Encourage participation in the therapeutic milieu Discharge planning in progress  Ethelene Hal, NP 06/22/2021, 5:23 PM

## 2021-06-22 NOTE — Progress Notes (Signed)
Patient states that he enjoyed the groups today and that he had a good visit this evening. His goal for tomorrow is to speak with both the social worker and doctor.

## 2021-06-22 NOTE — Progress Notes (Signed)
Pt denies SI/HI/AVH.  Pt rated depression at a 6/10 and anxiety at a 5/10.  Pt said his goal to go to groups and meet with doctor. RN provided support and assessed for needs and concerns.  Pt remains safe with q 15 min checks in place.  06/22/21 0745  Psych Admission Type (Psych Patients Only)  Admission Status Voluntary  Psychosocial Assessment  Patient Complaints Other (Comment);Anxiety;Depression (tired)  Eye Contact Fair  Facial Expression Pensive;Anxious  Affect Appropriate to circumstance  Speech Logical/coherent;Soft  Interaction Minimal  Motor Activity Slow  Appearance/Hygiene In scrubs  Behavior Characteristics Cooperative  Mood Pleasant  Thought Process  Coherency WDL  Content WDL  Delusions None reported or observed  Perception WDL  Hallucination None reported or observed  Judgment Poor  Confusion None  Danger to Self  Current suicidal ideation? Denies  Self-Injurious Behavior No self-injurious ideation or behavior indicators observed or expressed   Agreement Not to Harm Self Yes  Description of Agreement Verbal Contract  Danger to Others  Danger to Others None reported or observed

## 2021-06-23 DIAGNOSIS — F332 Major depressive disorder, recurrent severe without psychotic features: Secondary | ICD-10-CM | POA: Diagnosis not present

## 2021-06-23 MED ORDER — LIOTHYRONINE SODIUM 5 MCG PO TABS
10.0000 ug | ORAL_TABLET | Freq: Every day | ORAL | Status: DC
  Administered 2021-06-24 – 2021-06-29 (×6): 10 ug via ORAL
  Filled 2021-06-23 (×7): qty 2

## 2021-06-23 NOTE — Progress Notes (Signed)
[facility] MD Progress Note  06/23/2021 2:41 PM Gerald Boyer  MRN:  [id number] Subjective:  Patient is a 60 year old Caucasian male with hx of major depressive disorder & medical diagnosis that includes; s/p prostate cancer, HTN, Hyperlipidemia & COPD. Admitted to the [facility] from the [facility] with complaint of worsening symptoms of depression, anxiety/panic symptoms, poor concentration, excessive worrying, forgetfulness, decreased sleep & hopelessness & suicidal ideations. Patient apparently has battled depression most of his life & was most recently had received mental health treatment under the care of Dr. Nicolasa Ducking.  Evaluation on the unit today, 10/18: Patient was seen, chart reviewed and case discussed with the treatment team.   Patient works small improvement of mood since yesterday.  He reports small steady improvement of his mood, over the last 4 or 5 days of admission. Continues to report feeling very down and depressed for about 1 hour, after waking up.  Reports mood does improve during the day.  Reports sleep is better, getting about 8 hours last night.  He reports anxiety is some better as well.  Reports appetite is increased, which is an improvement.  He rates anxiety at 5 out of 10, which he reports is a still high level and bothersome, but less since admission.  Patient denies any side effects to current medications.  Denies having suicidal thoughts, which is an improvement.  Homicidal thoughts.  Denies AH.  Denies VH.  Discussed continued titrate fluoxetine for mood and anxiety.  We also discussed starting Cytomel, as augmentation for treatment of major depression, and patient is agreeable.  Denies history of heart disease.  He is attending group and socializing with his peers. His vital signs are stable. He contracts for safety on the unit.   Principal Problem: Severe recurrent major depression without psychotic features ([place]) Diagnosis: Principal Problem:   Severe recurrent major depression  without psychotic features ([place]) Active Problems:   Panic disorder (episodic paroxysmal anxiety)   GAD (generalized anxiety disorder)  Total Time spent with patient:  25 minutes  Past Psychiatric History:  Major depressive disorder  Past Medical History:  Past Medical History:  Diagnosis Date   Allergy    Anxiety    Cancer ([place])    Diverticulitis    Hyperlipidemia    Hypertension    Prostate cancer [facility])     Past Surgical History:  Procedure Laterality Date   LIPOMA EXCISION     located on  left shoulder   LYMPHADENECTOMY Bilateral 12/30/2017   Procedure: LYMPHADENECTOMY;  Surgeon: Alexis Frock, MD;  Location: [facility];  Service: Urology;  Laterality: Bilateral;   PROSTATE BIOPSY  01/2011   outpatient, Dr. Eliberto Ivory; 05/2011- mild chronic inflammation, no dysplasia   ROBOT ASSISTED LAPAROSCOPIC RADICAL PROSTATECTOMY N/A 12/30/2017   Procedure: XI ROBOTIC ASSISTED LAPAROSCOPIC RADICAL PROSTATECTOMY WITH PELVIC LYMPHADENECTOMY AND INJECTION OF INDOCYANINE GREEN DYE;  Surgeon: Alexis Frock, MD;  Location: [facility];  Service: Urology;  Laterality: N/A;   TONSILLECTOMY     VASECTOMY  1995   Family History:  Family History  Problem Relation Age of Onset   COPD Mother    Kidney cancer Mother    Prostate cancer Father        treated with radiation   Rectal cancer Daughter    Hypertension Other    Breast cancer Neg Hx    Colon cancer Neg Hx    Pancreatic cancer Neg Hx    Family Psychiatric  History: Bipolar disorder: Maternal grandmother Social History:  Social History  Substance and Sexual Activity  Alcohol Use Yes   Comment: 2-4 beers/day     Social History   Substance and Sexual Activity  Drug Use No   Comment: + benzos and MDMA    Social History   Socioeconomic History   Marital status: Single    Spouse name: IT sales professional   Number of children: 3   Years of education: Not on file   Highest education level: Not on file  Occupational History   Occupation:  Inside sales  Tobacco Use   Smoking status: Never   Smokeless tobacco: Never  Vaping Use   Vaping Use: Never used  Substance and Sexual Activity   Alcohol use: Yes    Comment: 2-4 beers/day   Drug use: No    Comment: + benzos and MDMA   Sexual activity: Not Currently  Other Topics Concern   Not on file  Social History Narrative   Divorced. Has two daughters and one son. Resides in [place] with significant other [name].   Social Determinants of Health   Financial Resource Strain: Not on file  Food Insecurity: Not on file  Transportation Needs: Not on file  Physical Activity: Not on file  Stress: Not on file  Social Connections: Not on file   Additional Social History:    Divorced, has 3 children, unemployed, lives in [place], [place].     Sleep: Fair  Appetite:  Fair  Current Medications: Current Facility-Administered Medications  Medication Dose Route Frequency Provider Last Rate Last Admin   acetaminophen (TYLENOL) tablet 650 mg  650 mg Oral Q6H PRN Clapacs, John T, MD       alum & mag hydroxide-simeth (MAALOX/MYLANTA) 200-200-20 MG/5ML suspension 30 mL  30 mL Oral Q4H PRN Clapacs, John T, MD       amLODipine (NORVASC) tablet 10 mg  10 mg Oral Daily Clapacs, John T, MD   10 mg at 06/23/21 7902   And   benazepril (LOTENSIN) tablet 20 mg  20 mg Oral Daily Clapacs, John T, MD   20 mg at 06/23/21 0816   clonazePAM (KLONOPIN) tablet 0.5 mg  0.5 mg Oral Q12H Nwoko, Agnes I, NP   0.5 mg at 06/23/21 0817   FLUoxetine (PROZAC) capsule 30 mg  30 mg Oral Daily Ethelene Hal, NP   30 mg at 06/23/21 0815   [START ON 06/25/2021] FLUoxetine (PROZAC) capsule 40 mg  40 mg Oral Daily Ethelene Hal, NP       fluticasone furoate-vilanterol (BREO ELLIPTA) 200-25 MCG/INH 1 puff  1 puff Inhalation Daily Clapacs, John T, MD       hydrOXYzine (ATARAX/VISTARIL) tablet 25 mg  25 mg Oral TID PRN Ethelene Hal, NP       magnesium hydroxide (MILK OF MAGNESIA) suspension 30  mL  30 mL Oral Daily PRN Clapacs, John T, MD   30 mL at 06/23/21 0618   OLANZapine (ZYPREXA) tablet 5 mg  5 mg Oral QHS Nwoko, Herbert Pun I, NP   5 mg at 06/22/21 2113   simvastatin (ZOCOR) tablet 20 mg  20 mg Oral q1800 Ethelene Hal, NP   20 mg at 06/22/21 2113   traZODone (DESYREL) tablet 100 mg  100 mg Oral QHS PRN Clapacs, Madie Reno, MD   100 mg at 06/22/21 2113    Lab Results:  No results found for this or any previous visit (from the past 48 hour(s)).   Blood Alcohol level:  Lab Results  Component Value Date  ETH <10 62/70/3500    Metabolic Disorder Labs: Lab Results  Component Value Date   HGBA1C 5.5 06/19/2021   MPG 111.15 06/19/2021   No results found for: PROLACTIN Lab Results  Component Value Date   CHOL 184 06/19/2021   TRIG 76 06/19/2021   HDL 62 06/19/2021   CHOLHDL 3.0 06/19/2021   VLDL 15 06/19/2021   LDLCALC 107 (H) 06/19/2021   LDLCALC 129 (H) 08/23/2017    Physical Findings: AIMS: Facial and Oral Movements Muscles of Facial Expression: None, normal Lips and Perioral Area: None, normal Jaw: None, normal Tongue: None, normal,Extremity Movements Upper (arms, wrists, hands, fingers): None, normal Lower (legs, knees, ankles, toes): None, normal, Trunk Movements Neck, shoulders, hips: None, normal, Overall Severity Severity of abnormal movements (highest score from questions above): None, normal Incapacitation due to abnormal movements: None, normal Patient's awareness of abnormal movements (rate only patient's report): No Awareness, Dental Status Current problems with teeth and/or dentures?: No Does patient usually wear dentures?: No    Musculoskeletal: Strength & Muscle Tone: within normal limits Gait & Station: normal Patient leans: N/A  Psychiatric Specialty Exam:  Presentation  General Appearance: Appropriate for Environment; Casual; Fairly Groomed  Eye Contact:Good  Speech:Normal Rate; Clear and Coherent  Speech  Volume:Normal  Handedness:Right  Mood and Affect  Mood:Anxious; Depressed  Affect:Congruent; Constricted; Depressed   Thought Process  Thought Processes:Linear  Descriptions of Associations:Intact  Orientation:Full (Time, Place and Person)  Thought Content:Logical  History of Schizophrenia/Schizoaffective disorder:No data recorded Duration of Psychotic Symptoms:No data recorded Hallucinations:No data recorded  Ideas of Reference:None  Suicidal Thoughts: denies  Homicidal Thoughts: denies   Sensorium  Memory:Immediate Good; Recent Good; Remote Good  Judgment:Fair  Insight:Fair  Executive Functions  Concentration:Good  Attention Span:Good  Harmony of Knowledge:Good  Language:Good  Psychomotor Activity  Psychomotor Activity:No data recorded  Assets  Assets:Communication Skills; Desire for Improvement; Housing; Resilience; Social Support; Physical Health  Sleep  Sleep:No data recorded  Physical Exam: Physical Exam Vitals and nursing note reviewed.  Constitutional:      General: He is not in acute distress.    Appearance: Normal appearance. He is not ill-appearing or toxic-appearing.  HENT:     Head: Normocephalic.  Pulmonary:     Effort: Pulmonary effort is normal.  Musculoskeletal:        General: Normal range of motion.     Cervical back: Normal range of motion.  Neurological:     General: No focal deficit present.     Mental Status: He is alert and oriented to person, place, and time.     Motor: No weakness.     Gait: Gait normal.  Psychiatric:        Attention and Perception: Attention and perception normal. He does not perceive auditory or visual hallucinations.        Mood and Affect: Mood is anxious and depressed.        Speech: Speech normal.        Behavior: Behavior normal. Behavior is cooperative.        Thought Content: Thought content normal. Thought content is not paranoid or delusional. Thought content does not  include homicidal or suicidal ideation. Thought content does not include homicidal or suicidal plan.        Cognition and Memory: Cognition normal.   Review of Systems  Constitutional: Negative.  Negative for fever.  HENT: Negative.  Negative for congestion, sinus pain and sore throat.   Respiratory: Negative.  Negative for cough  and shortness of breath.   Cardiovascular: Negative.  Negative for chest pain.  Gastrointestinal: Negative.   Genitourinary: Negative.   Musculoskeletal: Negative.   Neurological: Negative.   Psychiatric/Behavioral:  Positive for depression. The patient is nervous/anxious.   All other systems reviewed and are negative.  Blood pressure 119/87, pulse 82, temperature 98.3 F (36.8 C), temperature source Oral, resp. rate 20, height 5\' 8"  (1.727 m), weight 83.9 kg, SpO2 94 %. Body mass index is 28.13 kg/m.  Treatment Plan Summary: Daily contact with patient to assess and evaluate symptoms and progress in treatment and Medication management  Assessment: -Diagnosis is likely major depressive disorder, severe, recurrent, without psychotic features.  However we are also considering the diagnosis of bipolar disorder, type II, current depressive episode.  We are considering bipolar disorder, due to vague possible hypomanic symptoms in the past, most recently occurring about 3 years ago.  Additionally there is a family history of bipolar disorder and failure multiple first-line antidepressant medication trials.  -GAD -h/o pancreatic cancer   Plan: -Start cytomel 10 mg once daily for augmentation of depression. Denies h/o cardiac disease. TSH wnl.  -Increase Prozac to 30 mg PO daily x 2 days starting 06/23/21, then Increase Prozac to 40 mg PO daily -Continue Olanzapine 5 mg po Q hs.starting tomorrow (06-20-21). -Continue Vistaril 25 mg po tid prn. -Continue Clonazepam 0.5 mg po to Q 12 hours. -Continue Trazodone 100 mg po prn.   Labs reviewed 10/15 CBC with RBC 5.84,  Hgb 17.3, HCT 53.1, TSH 1.948.   Treat medical problems as indicated (resumed meds). Amlodipine 10 mg-benazepril 20 mg po daily for HTN. Breo Ellipta 200-25 mcg 1 puff daily for COPD. Simvastatin 20 mg po Q evenings for high cholesterol.   Other prn medications: continue, Acetaminophen 650 mg po Q 6 hrs prn for pain/fever. Mylanta 30 ml po Q 4 hrs prn for indigestion.  MOM 30 ml po Q daily prn for constipation.  Develop a treatment plan to decrease risk of relapse upon discharge and the need for readmission.  Suggest Health care follow up as needed for medical problems.  Call for consults with hospitalist for any additional specialty patient care services as needed  Continue every 15 minute safety checks Encourage participation in the therapeutic milieu Discharge planning in progress  Christoper Allegra, MD 06/23/2021, 2:41 PM

## 2021-06-23 NOTE — Group Note (Signed)
Recreation Therapy Group Note   Group Topic:Animal Assisted Therapy   Group Date: 06/23/2021 Start Time: 1430 End Time: 1510 Facilitators: Victorino Sparrow, LRT/CTRS Location: [place]  AAA/T Program Assumption of Risk Form signed by Patient/ or Parent Legal Guardian Yes  Patient understands his/her participation is voluntary Yes   Affect/Mood: N/A   Participation Level: Did not attend    Clinical Observations/Individualized Feedback: Pt did not attend group.     Plan: Continue to engage patient in RT group sessions 2-3x/week.   Victorino Sparrow, LRT/CTRS 06/23/2021 4:27 PM

## 2021-06-23 NOTE — Progress Notes (Signed)
Pt visible on the unit much of the evening. Pt given PRN Trazodone per MAR with HS medication    06/23/21 2300  Psych Admission Type (Psych Patients Only)  Admission Status Voluntary  Psychosocial Assessment  Patient Complaints Anxiety;Depression  Eye Contact Fair  Facial Expression Anxious  Affect Appropriate to circumstance  Speech Logical/coherent;Soft  Interaction Minimal  Motor Activity Slow  Appearance/Hygiene In scrubs  Behavior Characteristics Cooperative  Mood Anxious;Pleasant  Aggressive Behavior  Effect No apparent injury  Thought Process  Coherency WDL  Content WDL  Delusions None reported or observed  Perception WDL  Hallucination None reported or observed  Judgment Poor  Confusion None  Danger to Self  Current suicidal ideation? Denies  Self-Injurious Behavior No self-injurious ideation or behavior indicators observed or expressed   Agreement Not to Harm Self Yes  Description of Agreement Verbal Contract  Danger to Others  Danger to Others None reported or observed

## 2021-06-23 NOTE — [facility] Group Notes (Signed)
The focus of this group is to help patients establish daily goals to achieve during treatment and discuss how the patient can incorporate goal setting into their daily lives to aide in recovery.  Pt attended morning goals group. Pt stated his goal was to talk to his social worker sometime today.

## 2021-06-23 NOTE — Progress Notes (Signed)
The patient rated his day as a 6 out of 10 since he is beginning to feel better. His goal for tomorrow is to speak with the social worker about his discharge plans.

## 2021-06-24 ENCOUNTER — Encounter (HOSPITAL_COMMUNITY): Payer: Self-pay

## 2021-06-24 NOTE — Progress Notes (Signed)
[facility] MD Progress Note  06/24/2021 2:40 PM Gerald Boyer  MRN:  [id number]  Subjective:  Patient is a 60 year old Caucasian male with hx of major depressive disorder & medical diagnosis that includes; s/p prostate cancer, HTN, Hyperlipidemia & COPD. Admitted to the [facility] from the [facility] with complaint of worsening symptoms of depression, anxiety/panic symptoms, poor concentration, excessive worrying, forgetfulness, decreased sleep & hopelessness & suicidal ideations. Patient apparently has battled depression most of his life & was most recently had received mental health treatment under the care of Dr. Nicolasa Ducking.  Evaluation on the unit today, 10/18: Patient was seen, chart reviewed and case discussed with the treatment team.   Patient stated he has noticed a small improvement of his mood since yesterday.  He reported he thinks his mood has improved over the course of this hospitalization. He continues to report feeling very down and depressed for about 1 hour, after waking up and hopes the medication added today will help. He stated he feels his sleep has improved.  He reports he slept "pretty well" last night. His sleep was recorded as 6.75 hours, he stated he thinks he was asleep before they started checking so he probably got more than that. He reports his anxiety is improving, he does not appear as anxious as previously.  He stated he is eating better, which is an improvement.   He did not rate his anxiety or depression today. His affect is flat and constricted, he appears anxious, however less so than when first admitted.   We discussed coping mechanisms he has learned to use when his mind starts the constant loop of worry and anxiety. He stated he reads his book or talks to his peers. He stated when he is at home, he reads or takes his dog on walks to help with the worry. He brightened a little bit when talking about his dog, Bella.  He enjoyed seeing Bodi the therapy dog yesterday. We discussed the  therapeutic effect pets have on our lives.   Patient denies any side effects to current medications. He stated he did have some constipation which resolved with MOM. Encouraged to drink plenty of fluids. He denies suicidal thoughts today.  He denies homicidal thoughts and auditory and visual hallucinations. He is still alright with up titration of his fluoxetine, goes to 40 mg on 10/20 to better target his depression and anxiety. PRN's in the past 24 hours: Trazodone for sleep, and MOM for constipation.   He is attending group and socializing with his peers. He would like to shave. Informed him to ask one of the techs if they have time to watch him shave later. He has been speaking with his girlfriend on the phone, she is visiting him tonight. His son visited last night. His vital signs are stable. He denies history of heart disease. He contracts for safety on the unit.   Principal Problem: Severe recurrent major depression without psychotic features ([place]) Diagnosis: Principal Problem:   Severe recurrent major depression without psychotic features ([place]) Active Problems:   Panic disorder (episodic paroxysmal anxiety)   GAD (generalized anxiety disorder)  Total Time spent with patient:  25 minutes  Past Psychiatric History:  Major depressive disorder  Past Medical History:  Past Medical History:  Diagnosis Date   Allergy    Anxiety    Cancer ([place])    Diverticulitis    Hyperlipidemia    Hypertension    Prostate cancer [facility])     Past Surgical  History:  Procedure Laterality Date   LIPOMA EXCISION     located on  left shoulder   LYMPHADENECTOMY Bilateral 12/30/2017   Procedure: LYMPHADENECTOMY;  Surgeon: Alexis Frock, MD;  Location: [facility];  Service: Urology;  Laterality: Bilateral;   PROSTATE BIOPSY  01/2011   outpatient, Dr. Eliberto Ivory; 05/2011- mild chronic inflammation, no dysplasia   ROBOT ASSISTED LAPAROSCOPIC RADICAL PROSTATECTOMY N/A 12/30/2017   Procedure: XI ROBOTIC ASSISTED  LAPAROSCOPIC RADICAL PROSTATECTOMY WITH PELVIC LYMPHADENECTOMY AND INJECTION OF INDOCYANINE GREEN DYE;  Surgeon: Alexis Frock, MD;  Location: [facility];  Service: Urology;  Laterality: N/A;   TONSILLECTOMY     VASECTOMY  1995   Family History:  Family History  Problem Relation Age of Onset   COPD Mother    Kidney cancer Mother    Prostate cancer Father        treated with radiation   Rectal cancer Daughter    Hypertension Other    Breast cancer Neg Hx    Colon cancer Neg Hx    Pancreatic cancer Neg Hx    Family Psychiatric  History: Bipolar disorder: Maternal grandmother Social History:  Social History   Substance and Sexual Activity  Alcohol Use Yes   Comment: 2-4 beers/day     Social History   Substance and Sexual Activity  Drug Use No   Comment: + benzos and MDMA    Social History   Socioeconomic History   Marital status: Single    Spouse name: IT sales professional   Number of children: 3   Years of education: Not on file   Highest education level: Not on file  Occupational History   Occupation: Inside sales  Tobacco Use   Smoking status: Never   Smokeless tobacco: Never  Vaping Use   Vaping Use: Never used  Substance and Sexual Activity   Alcohol use: Yes    Comment: 2-4 beers/day   Drug use: No    Comment: + benzos and MDMA   Sexual activity: Not Currently  Other Topics Concern   Not on file  Social History Narrative   Divorced. Has two daughters and one son. Resides in [place] with significant other [name].   Social Determinants of Health   Financial Resource Strain: Not on file  Food Insecurity: Not on file  Transportation Needs: Not on file  Physical Activity: Not on file  Stress: Not on file  Social Connections: Not on file   Additional Social History:    Divorced, has 3 children, unemployed, lives in [place], [place].     Sleep: Fair, improving  Appetite:  Fair, improving  Current Medications: Current Facility-Administered Medications   Medication Dose Route Frequency Provider Last Rate Last Admin   acetaminophen (TYLENOL) tablet 650 mg  650 mg Oral Q6H PRN Clapacs, John T, MD       alum & mag hydroxide-simeth (MAALOX/MYLANTA) 200-200-20 MG/5ML suspension 30 mL  30 mL Oral Q4H PRN Clapacs, John T, MD       amLODipine (NORVASC) tablet 10 mg  10 mg Oral Daily Clapacs, Madie Reno, MD   10 mg at 06/24/21 1324   And   benazepril (LOTENSIN) tablet 20 mg  20 mg Oral Daily Clapacs, John T, MD   20 mg at 06/24/21 0811   clonazePAM (KLONOPIN) tablet 0.5 mg  0.5 mg Oral Q12H Nwoko, Agnes I, NP   0.5 mg at 06/24/21 0813   [START ON 06/25/2021] FLUoxetine (PROZAC) capsule 40 mg  40 mg Oral Daily Ethelene Hal, NP  fluticasone furoate-vilanterol (BREO ELLIPTA) 200-25 MCG/INH 1 puff  1 puff Inhalation Daily Clapacs, John T, MD       hydrOXYzine (ATARAX/VISTARIL) tablet 25 mg  25 mg Oral TID PRN Ethelene Hal, NP       liothyronine (CYTOMEL) tablet 10 mcg  10 mcg Oral Daily Massengill, Nathan, MD   10 mcg at 06/24/21 0811   magnesium hydroxide (MILK OF MAGNESIA) suspension 30 mL  30 mL Oral Daily PRN Clapacs, John T, MD   30 mL at 06/23/21 0618   OLANZapine (ZYPREXA) tablet 5 mg  5 mg Oral QHS Nwoko, Agnes I, NP   5 mg at 06/23/21 2122   simvastatin (ZOCOR) tablet 20 mg  20 mg Oral q1800 Ethelene Hal, NP   20 mg at 06/23/21 2122   traZODone (DESYREL) tablet 100 mg  100 mg Oral QHS PRN Clapacs, Madie Reno, MD   100 mg at 06/23/21 2123    Lab Results:  No results found for this or any previous visit (from the past 48 hour(s)).   Blood Alcohol level:  Lab Results  Component Value Date   ETH <10 35/46/5681    Metabolic Disorder Labs: Lab Results  Component Value Date   HGBA1C 5.5 06/19/2021   MPG 111.15 06/19/2021   No results found for: PROLACTIN Lab Results  Component Value Date   CHOL 184 06/19/2021   TRIG 76 06/19/2021   HDL 62 06/19/2021   CHOLHDL 3.0 06/19/2021   VLDL 15 06/19/2021   LDLCALC 107  (H) 06/19/2021   LDLCALC 129 (H) 08/23/2017    Physical Findings: AIMS: Facial and Oral Movements Muscles of Facial Expression: None, normal Lips and Perioral Area: None, normal Jaw: None, normal Tongue: None, normal,Extremity Movements Upper (arms, wrists, hands, fingers): None, normal Lower (legs, knees, ankles, toes): None, normal, Trunk Movements Neck, shoulders, hips: None, normal, Overall Severity Severity of abnormal movements (highest score from questions above): None, normal Incapacitation due to abnormal movements: None, normal Patient's awareness of abnormal movements (rate only patient's report): No Awareness, Dental Status Current problems with teeth and/or dentures?: No Does patient usually wear dentures?: No    Musculoskeletal: Strength & Muscle Tone: within normal limits Gait & Station: normal Patient leans: N/A  Psychiatric Specialty Exam:  Presentation  General Appearance: Appropriate for Environment; Casual; Fairly Groomed  Eye Contact:Good  Speech:Normal Rate; Clear and Coherent  Speech Volume:Normal  Handedness:Right  Mood and Affect  Mood:Anxious; Depressed  Affect:Congruent; Constricted; Depressed, improving since admisison  Thought Process  Thought Processes:Linear; Coherent; Goal Directed  Descriptions of Associations:Intact  Orientation:Full (Time, Place and Person)  Thought Content:Logical  History of Schizophrenia/Schizoaffective disorder:No Duration of Psychotic Symptoms: N/A Hallucinations:Denies  Ideas of Reference:None  Suicidal Thoughts: denies  Homicidal Thoughts: denies   Sensorium  Memory:Immediate Good; Recent Good; Remote Good  Judgment:Fair  Insight:Fair  Executive Functions  Concentration:Good  Attention Span:Good  [place]  Language:Good  Psychomotor Activity  Psychomotor Activity:Normal  Assets  Assets:Communication Skills; Desire for Improvement; Housing;  Resilience; Social Support; Physical Health; Vocational/Educational  Sleep  Sleep: Fair 6.75 hours  Physical Exam: Physical Exam Vitals and nursing note reviewed.  Constitutional:      General: He is not in acute distress.    Appearance: Normal appearance. He is not ill-appearing or toxic-appearing.  HENT:     Head: Normocephalic.  Pulmonary:     Effort: Pulmonary effort is normal.  Musculoskeletal:        General: Normal range of  motion.     Cervical back: Normal range of motion.  Neurological:     General: No focal deficit present.     Mental Status: He is alert and oriented to person, place, and time.     Motor: No weakness.     Gait: Gait normal.  Psychiatric:        Attention and Perception: Attention and perception normal. He does not perceive auditory or visual hallucinations.        Mood and Affect: Mood is anxious and depressed.        Speech: Speech normal.        Behavior: Behavior normal. Behavior is cooperative.        Thought Content: Thought content normal. Thought content is not paranoid or delusional. Thought content does not include homicidal or suicidal ideation. Thought content does not include homicidal or suicidal plan.        Cognition and Memory: Cognition normal.   Review of Systems  Constitutional: Negative.  Negative for fever.  HENT: Negative.  Negative for congestion, sinus pain and sore throat.   Respiratory: Negative.  Negative for cough and shortness of breath.   Cardiovascular: Negative.  Negative for chest pain.  Gastrointestinal: Negative.   Genitourinary: Negative.   Musculoskeletal: Negative.   Neurological: Negative.   Psychiatric/Behavioral:  Positive for depression. The patient is nervous/anxious.   All other systems reviewed and are negative.  Blood pressure 123/80, pulse 78, temperature 97.9 F (36.6 C), temperature source Oral, resp. rate 20, height 5\' 8"  (1.727 m), weight 83.9 kg, SpO2 94 %. Body mass index is 28.13  kg/m.  Treatment Plan Summary: Daily contact with patient to assess and evaluate symptoms and progress in treatment and Medication management  Assessment: -Diagnosis is likely major depressive disorder, severe, recurrent, without psychotic features.  However we are also considering the diagnosis of bipolar disorder, type II, current depressive episode.  We are considering bipolar disorder, due to vague possible hypomanic symptoms in the past, most recently occurring about 3 years ago.  Additionally there is a family history of bipolar disorder and failure multiple first-line antidepressant medication trials.  -GAD -h/o prostate cancer   Plan: -Continue cytomel 10 mg once daily for augmentation of depression. Denies h/o cardiac disease. TSH wnl.  -Increase Prozac to 30 mg PO daily x 2 days starting 06/23/21, then Increase Prozac to 40 mg PO daily starting 10/20 -Continue Olanzapine 5 mg po QHS.starting tomorrow (06-20-21). -Continue Vistaril 25 mg PO TID PRN. -Continue Clonazepam 0.5 mg po to Q 12 hours. -Continue Trazodone 100 mg PO PRN.   Labs reviewed 10/15 CBC with RBC 5.84, Hgb 17.3, HCT 53.1, TSH 1.948.   Treat medical problems as indicated (resumed meds). Amlodipine 10 mg-benazepril 20 mg PO daily for HTN. Breo Ellipta 200-25 mcg 1 puff daily for COPD. Simvastatin 20 mg PO every evening for high cholesterol.   Other prn medications: continue, Acetaminophen 650 mg PO every 6 hrs PRN for pain/fever. Mylanta 30 ml PO every 4 hrs PRN for indigestion.  MOM 30 ml PO  daily PRN for constipation.  Develop a treatment plan to decrease risk of relapse upon discharge and the need for readmission.  Suggest Health care follow up as needed for medical problems.  Call for consults with hospitalist for any additional specialty patient care services as needed  Continue every 15 minute safety checks Encourage participation in the therapeutic milieu Discharge planning in progress  Ethelene Hal, NP 06/24/2021, 2:40 PM

## 2021-06-24 NOTE — BH IP Treatment Plan (Signed)
Interdisciplinary Treatment and Diagnostic Plan Update  06/24/2021 Time of Session: 9:45am  Casimiro Lienhard MRN: [id number]  Principal Diagnosis: Severe recurrent major depression without psychotic features [facility])  Secondary Diagnoses: Principal Problem:   Severe recurrent major depression without psychotic features ([place]) Active Problems:   Panic disorder (episodic paroxysmal anxiety)   GAD (generalized anxiety disorder)   Current Medications:  Current Facility-Administered Medications  Medication Dose Route Frequency Provider Last Rate Last Admin   acetaminophen (TYLENOL) tablet 650 mg  650 mg Oral Q6H PRN Clapacs, John T, MD       alum & mag hydroxide-simeth (MAALOX/MYLANTA) 200-200-20 MG/5ML suspension 30 mL  30 mL Oral Q4H PRN Clapacs, John T, MD       amLODipine (NORVASC) tablet 10 mg  10 mg Oral Daily Clapacs, Madie Reno, MD   10 mg at 06/24/21 1962   And   benazepril (LOTENSIN) tablet 20 mg  20 mg Oral Daily Clapacs, John T, MD   20 mg at 06/24/21 0811   clonazePAM (KLONOPIN) tablet 0.5 mg  0.5 mg Oral Q12H Nwoko, Agnes I, NP   0.5 mg at 06/24/21 0813   [START ON 06/25/2021] FLUoxetine (PROZAC) capsule 40 mg  40 mg Oral Daily Ethelene Hal, NP       fluticasone furoate-vilanterol (BREO ELLIPTA) 200-25 MCG/INH 1 puff  1 puff Inhalation Daily Clapacs, John T, MD       hydrOXYzine (ATARAX/VISTARIL) tablet 25 mg  25 mg Oral TID PRN Ethelene Hal, NP       liothyronine (CYTOMEL) tablet 10 mcg  10 mcg Oral Daily Massengill, Ovid Curd, MD   10 mcg at 06/24/21 0811   magnesium hydroxide (MILK OF MAGNESIA) suspension 30 mL  30 mL Oral Daily PRN Clapacs, John T, MD   30 mL at 06/23/21 0618   OLANZapine (ZYPREXA) tablet 5 mg  5 mg Oral QHS Nwoko, Agnes I, NP   5 mg at 06/23/21 2122   simvastatin (ZOCOR) tablet 20 mg  20 mg Oral q1800 Ethelene Hal, NP   20 mg at 06/23/21 2122   traZODone (DESYREL) tablet 100 mg  100 mg Oral QHS PRN Clapacs, Madie Reno, MD   100 mg at  06/23/21 2123   PTA Medications: Medications Prior to Admission  Medication Sig Dispense Refill Last Dose   albuterol (PROVENTIL HFA;VENTOLIN HFA) 108 (90 Base) MCG/ACT inhaler Inhale 2 puffs into the lungs every 6 (six) hours as needed for wheezing or shortness of breath. (Patient not taking: Reported on 01/24/2019) 1 Inhaler 2    amLODipine-benazepril (LOTREL) 10-20 MG capsule TAKE 1 CAPSULE BY MOUTH EVERY DAY IN THE MORNING      bicalutamide (CASODEX) 50 MG tablet  (Patient not taking: No sig reported)      BREO ELLIPTA 200-25 MCG/INH AEPB TAKE 1 PUFF BY MOUTH EVERY DAY**NEEDS OFFICE VISIT** (Patient not taking: No sig reported) 60 each 4    clonazePAM (KLONOPIN) 0.5 MG tablet TAKE 1 TABLET BY MOUTH THREE TIMES A DAY AS NEEDED FOR ANXIETY (Patient not taking: No sig reported) 60 tablet 1    fenofibrate (TRICOR) 145 MG tablet Take 145 mg by mouth daily.      fluticasone (FLONASE) 50 MCG/ACT nasal spray Place 2 sprays into both nostrils daily as needed for allergies or rhinitis.      Loratadine 10 MG CAPS Take 1 tablet by mouth daily as needed (allergies).       sertraline (ZOLOFT) 100 MG tablet Take 100 mg by  mouth every morning. (Patient not taking: No sig reported)      simvastatin (ZOCOR) 20 MG tablet Take 20 mg by mouth daily. (Patient not taking: No sig reported)      venlafaxine XR (EFFEXOR-XR) 150 MG 24 hr capsule TAKE 1 CAPSULE BY MOUTH EVERY DAY (Patient not taking: No sig reported) 90 capsule 4     Patient Stressors: Financial difficulties   Health problems   Medication change or noncompliance   Occupational concerns    Patient Strengths: Ability for insight  Average or above average intelligence  Capable of independent living  Motivation for treatment/growth  Supportive family/friends  Work skills   Treatment Modalities: Medication Management, Group therapy, Case management,  1 to 1 session with clinician, Psychoeducation, Recreational therapy.   Physician Treatment Plan  for Primary Diagnosis: Severe recurrent major depression without psychotic features ([place]) Long Term Goal(s): Improvement in symptoms so as ready for discharge   Short Term Goals: Ability to identify changes in lifestyle to reduce recurrence of condition will improve Ability to verbalize feelings will improve Ability to disclose and discuss suicidal ideas Ability to demonstrate self-control will improve Ability to identify and develop effective coping behaviors will improve Ability to maintain clinical measurements within normal limits will improve Compliance with prescribed medications will improve Ability to identify triggers associated with substance abuse/mental health issues will improve  Medication Management: Evaluate patient's response, side effects, and tolerance of medication regimen.  Therapeutic Interventions: 1 to 1 sessions, Unit Group sessions and Medication administration.  Evaluation of Outcomes: Progressing  Physician Treatment Plan for Secondary Diagnosis: Principal Problem:   Severe recurrent major depression without psychotic features ([place]) Active Problems:   Panic disorder (episodic paroxysmal anxiety)   GAD (generalized anxiety disorder)  Long Term Goal(s): Improvement in symptoms so as ready for discharge   Short Term Goals: Ability to identify changes in lifestyle to reduce recurrence of condition will improve Ability to verbalize feelings will improve Ability to disclose and discuss suicidal ideas Ability to demonstrate self-control will improve Ability to identify and develop effective coping behaviors will improve Ability to maintain clinical measurements within normal limits will improve Compliance with prescribed medications will improve Ability to identify triggers associated with substance abuse/mental health issues will improve     Medication Management: Evaluate patient's response, side effects, and tolerance of medication regimen.  Therapeutic  Interventions: 1 to 1 sessions, Unit Group sessions and Medication administration.  Evaluation of Outcomes: Progressing   RN Treatment Plan for Primary Diagnosis: Severe recurrent major depression without psychotic features ([place]) Long Term Goal(s): Knowledge of disease and therapeutic regimen to maintain health will improve  Short Term Goals: Ability to remain free from injury will improve, Ability to participate in decision making will improve, Ability to verbalize feelings will improve, Ability to disclose and discuss suicidal ideas, and Ability to identify and develop effective coping behaviors will improve  Medication Management: RN will administer medications as ordered by provider, will assess and evaluate patient's response and provide education to patient for prescribed medication. RN will report any adverse and/or side effects to prescribing provider.  Therapeutic Interventions: 1 on 1 counseling sessions, Psychoeducation, Medication administration, Evaluate responses to treatment, Monitor vital signs and CBGs as ordered, Perform/monitor CIWA, COWS, AIMS and Fall Risk screenings as ordered, Perform wound care treatments as ordered.  Evaluation of Outcomes: Progressing   LCSW Treatment Plan for Primary Diagnosis: Severe recurrent major depression without psychotic features ([place]) Long Term Goal(s): Safe transition to appropriate next level of  care at discharge, Engage patient in therapeutic group addressing interpersonal concerns.  Short Term Goals: Engage patient in aftercare planning with referrals and resources, Increase social support, Increase emotional regulation, Facilitate acceptance of mental health diagnosis and concerns, Identify triggers associated with mental health/substance abuse issues, and Increase skills for wellness and recovery  Therapeutic Interventions: Assess for all discharge needs, 1 to 1 time with Social worker, Explore available resources and support systems,  Assess for adequacy in community support network, Educate family and significant other(s) on suicide prevention, Complete Psychosocial Assessment, Interpersonal group therapy.  Evaluation of Outcomes: Progressing   Progress in Treatment: Attending groups: Yes. Participating in groups: Yes. Taking medication as prescribed: Yes. Toleration medication: Yes. Family/Significant other contact made: Yes, individual(s) contacted:  Girlfriend  Patient understands diagnosis: Yes. Discussing patient identified problems/goals with staff: Yes. Medical problems stabilized or resolved: Yes. Denies suicidal/homicidal ideation: Yes. Issues/concerns per patient self-inventory: No.   New problem(s) identified: No, Describe:  None   New Short Term/Long Term Goal(s): medication stabilization, elimination of SI thoughts, development of comprehensive mental wellness plan.   Patient Goals:  "To get mentally healthy"  Discharge Plan or Barriers: Patient recently admitted. CSW will continue to follow and assess for appropriate referrals and possible discharge planning.   Reason for Continuation of Hospitalization: Anxiety Mania Medication stabilization  Estimated Length of Stay: 3 to 5 days    Scribe for Treatment Team: Darleen Crocker, Latanya Presser 06/24/2021 2:51 PM

## 2021-06-24 NOTE — Group Note (Signed)
Therapy Type: Group Therapy: Self Esteem  Participation Level:  Active   Patients received a worksheet with an outline of 2 gingerbread men with a separation in the middle of the page. One sign designated what the pt sees about themselves and the other is what others see. Pts were asked to introduce themselves and share something they like about themself. Pts were then asked to draw, write or color how they view themselves as well as how they are viewed by others. CSW led discussion about self esteem and ways to improve self esteem.   Patient Summary:  Patient participated appropriately in group and participated in activities associated with self esteem.  Patient brainstormed different ways that he can improve self esteem.

## 2021-06-24 NOTE — Progress Notes (Signed)
Pt visible on the unit stated he was feeling better, pt anticipating  D/C Monday, pt given PRN Trazodone per [facility] with HS medication    06/24/21 2200  Psych Admission Type (Psych Patients Only)  Admission Status Voluntary  Psychosocial Assessment  Patient Complaints Depression  Eye Contact Fair  Facial Expression Anxious  Affect Appropriate to circumstance  Speech Logical/coherent;Soft  Interaction Minimal  Motor Activity Slow  Appearance/Hygiene In scrubs  Behavior Characteristics Cooperative  Mood Pleasant  Aggressive Behavior  Effect No apparent injury  Thought Process  Coherency WDL  Content WDL  Delusions None reported or observed  Perception WDL  Hallucination None reported or observed  Judgment Poor  Confusion None  Danger to Self  Current suicidal ideation? Denies  Self-Injurious Behavior No self-injurious ideation or behavior indicators observed or expressed   Agreement Not to Harm Self Yes  Description of Agreement Verbal Contract  Danger to Others  Danger to Others None reported or observed

## 2021-06-24 NOTE — Progress Notes (Signed)
Adult Psychoeducational Group Note  Date:  06/24/2021 Time:  10:56 PM  Group Topic/Focus:  Wrap-Up Group:   The focus of this group is to help patients review their daily goal of treatment and discuss progress on daily workbooks.  Participation Level:  Active  Participation Quality:  Appropriate  Affect:  Appropriate  Cognitive:  Appropriate  Insight: Appropriate  Engagement in Group:  Engaged  Modes of Intervention:  Discussion  Additional Comments:  pat attend wrap up group  his day was 6. The one positive thing that happen meet new people.  Lenice Llamas Long 06/24/2021, 10:56 PM

## 2021-06-24 NOTE — BHH Group Notes (Signed)
The focus of this group is to help patients establish daily goals to achieve during treatment and discuss how the patient can incorporate goal setting into their daily lives to aide in recovery.  Pt did not attend morning goals group.

## 2021-06-24 NOTE — Group Note (Signed)
Recreation Therapy Group Note   Group Topic:Stress Management  Group Date: 06/24/2021 Start Time: 0930 End Time: 0945 Facilitators: Victorino Sparrow, LRT/CTRS Location: 300 Hall Dayroom  Goal Area(s) Addresses:  Patient will identify positive stress management techniques. Patient will identify benefits of using stress management post d/c.   Group Description:  Meditation.  LRT played a meditation that focused on setting personal boundaries for self care.  The meditation focused on learning to say no for things you don't want to do instead of trying to make others happy at the expense of yourself.   Affect/Mood: Appropriate   Participation Level: Engaged   Participation Quality: Independent   Behavior: Appropriate   Speech/Thought Process: Focused   Insight: Good   Judgement: Good   Modes of Intervention: Meditation   Patient Response to Interventions:  Engaged   Education Outcome:  Acknowledges education and In group clarification offered    Clinical Observations/Individualized Feedback: Pt was attentive and engaged throughout group.    Plan: Continue to engage patient in RT group sessions 2-3x/week.   Victorino Sparrow, LRT/CTRS 06/24/2021 11:57 AM

## 2021-06-25 DIAGNOSIS — F332 Major depressive disorder, recurrent severe without psychotic features: Secondary | ICD-10-CM | POA: Diagnosis not present

## 2021-06-25 MED ORDER — FLUOXETINE HCL 20 MG PO CAPS
50.0000 mg | ORAL_CAPSULE | Freq: Every day | ORAL | Status: DC
  Administered 2021-06-26: 50 mg via ORAL
  Filled 2021-06-25 (×3): qty 1

## 2021-06-25 NOTE — Progress Notes (Signed)
[facility] MD Progress Note  06/25/2021 6:02 PM Azai Gaffin  MRN:  [id number]  Subjective:  Patient is a 60 year old Caucasian male with hx of major depressive disorder & medical diagnosis that includes; s/p prostate cancer, HTN, Hyperlipidemia & COPD. Admitted to the [facility] from the [facility] with complaint of worsening symptoms of depression, anxiety/panic symptoms, poor concentration, excessive worrying, forgetfulness, decreased sleep & hopelessness & suicidal ideations. Patient apparently has battled depression most of his life & was most recently had received mental health treatment under the care of Dr. Nicolasa Ducking.  Evaluation on the unit today, 10/20: Patient was seen, chart reviewed and case discussed with the treatment team.  Daily notes 06/25/21: Jaben reports today that he feels he is getting better everyday. He says the attending psychiatrist has added another medication to his current treatment regimen to augment his previous medications he has been receiving for his depression. He says he feels relaxed today, just a little apprehensive about getting discharged because he will be going back to the same old stress, environment & situation (finding a job & getting health insurance). Romulus is encouraged & informed that the reason for his treatments here is to help balance his mood to allow him the stamina, energy & courage to deal/cope with life situations to achieve something sustainable for him eventually. He says he is sleeping well at night. Reports good appetite. Taking & tolerating his treatment regimen. He denies any SIHI, AVH, delusional thoughts or paranoia. He does not appear to be responding to any internal stimuli. There no changes made on his current plan of care. He remains active in the group milieu.    Per previous notes: Patient stated he has noticed a small improvement of his mood since yesterday.  He reported he thinks his mood has improved over the course of this hospitalization. He  continues to report feeling very down and depressed for about 1 hour, after waking up and hopes the medication added today will help. He stated he feels his sleep has improved.  He reports he slept "pretty well" last night. His sleep was recorded as 6.75 hours, he stated he thinks he was asleep before they started checking so he probably got more than that. He reports his anxiety is improving, he does not appear as anxious as previously.  He stated he is eating better, which is an improvement.   He did not rate his anxiety or depression today. His affect is flat and constricted, he appears anxious, however less so than when first admitted.   We discussed coping mechanisms he has learned to use when his mind starts the constant loop of worry and anxiety. He stated he reads his book or talks to his peers. He stated when he is at home, he reads or takes his dog on walks to help with the worry. He brightened a little bit when talking about his dog, Bella.  He enjoyed seeing Bodi the therapy dog yesterday. We discussed the therapeutic effect pets have on our lives.   Patient denies any side effects to current medications. He stated he did have some constipation which resolved with MOM. Encouraged to drink plenty of fluids. He denies suicidal thoughts today.  He denies homicidal thoughts and auditory and visual hallucinations. He is still alright with up titration of his fluoxetine, goes to 40 mg on 10/20 to better target his depression and anxiety. PRN's in the past 24 hours: Trazodone for sleep, and MOM for constipation.   He is  attending group and socializing with his peers. He would like to shave. Informed him to ask one of the techs if they have time to watch him shave later. He has been speaking with his girlfriend on the phone, she is visiting him tonight. His son visited last night. His vital signs are stable. He denies history of heart disease. He contracts for safety on the unit.   Principal Problem:  Severe recurrent major depression without psychotic features ([place]) Diagnosis: Principal Problem:   Severe recurrent major depression without psychotic features ([place]) Active Problems:   Panic disorder (episodic paroxysmal anxiety)   GAD (generalized anxiety disorder)  Total Time spent with patient:  25 minutes  Past Psychiatric History:  Major depressive disorder  Past Medical History:  Past Medical History:  Diagnosis Date   Allergy    Anxiety    Cancer ([place])    Diverticulitis    Hyperlipidemia    Hypertension    Prostate cancer [facility])     Past Surgical History:  Procedure Laterality Date   LIPOMA EXCISION     located on  left shoulder   LYMPHADENECTOMY Bilateral 12/30/2017   Procedure: LYMPHADENECTOMY;  Surgeon: Alexis Frock, MD;  Location: [facility];  Service: Urology;  Laterality: Bilateral;   PROSTATE BIOPSY  01/2011   outpatient, Dr. Eliberto Ivory; 05/2011- mild chronic inflammation, no dysplasia   ROBOT ASSISTED LAPAROSCOPIC RADICAL PROSTATECTOMY N/A 12/30/2017   Procedure: XI ROBOTIC ASSISTED LAPAROSCOPIC RADICAL PROSTATECTOMY WITH PELVIC LYMPHADENECTOMY AND INJECTION OF INDOCYANINE GREEN DYE;  Surgeon: Alexis Frock, MD;  Location: [facility];  Service: Urology;  Laterality: N/A;   TONSILLECTOMY     VASECTOMY  1995   Family History:  Family History  Problem Relation Age of Onset   COPD Mother    Kidney cancer Mother    Prostate cancer Father        treated with radiation   Rectal cancer Daughter    Hypertension Other    Breast cancer Neg Hx    Colon cancer Neg Hx    Pancreatic cancer Neg Hx    Family Psychiatric  History: Bipolar disorder: Maternal grandmother  Social History:  Social History   Substance and Sexual Activity  Alcohol Use Yes   Comment: 2-4 beers/day     Social History   Substance and Sexual Activity  Drug Use No   Comment: + benzos and MDMA    Social History   Socioeconomic History   Marital status: Single    Spouse name: IT sales professional    Number of children: 3   Years of education: Not on file   Highest education level: Not on file  Occupational History   Occupation: Inside sales  Tobacco Use   Smoking status: Never   Smokeless tobacco: Never  Vaping Use   Vaping Use: Never used  Substance and Sexual Activity   Alcohol use: Yes    Comment: 2-4 beers/day   Drug use: No    Comment: + benzos and MDMA   Sexual activity: Not Currently  Other Topics Concern   Not on file  Social History Narrative   Divorced. Has two daughters and one son. Resides in [place] with significant other [name].   Social Determinants of Health   Financial Resource Strain: Not on file  Food Insecurity: Not on file  Transportation Needs: Not on file  Physical Activity: Not on file  Stress: Not on file  Social Connections: Not on file   Additional Social History:    Divorced, has 3  children, unemployed, lives in [place], [place].    Sleep: Good, 6.75 hrs per documentation.  Appetite:  Good,   Current Medications: Current Facility-Administered Medications  Medication Dose Route Frequency Provider Last Rate Last Admin   acetaminophen (TYLENOL) tablet 650 mg  650 mg Oral Q6H PRN Clapacs, John T, MD       alum & mag hydroxide-simeth (MAALOX/MYLANTA) 200-200-20 MG/5ML suspension 30 mL  30 mL Oral Q4H PRN Clapacs, John T, MD       amLODipine (NORVASC) tablet 10 mg  10 mg Oral Daily Clapacs, John T, MD   10 mg at 06/25/21 7564   And   benazepril (LOTENSIN) tablet 20 mg  20 mg Oral Daily Clapacs, John T, MD   20 mg at 06/25/21 0816   clonazePAM (KLONOPIN) tablet 0.5 mg  0.5 mg Oral Q12H Jobanny Mavis I, NP   0.5 mg at 06/25/21 0816   FLUoxetine (PROZAC) capsule 40 mg  40 mg Oral Daily Ethelene Hal, NP   40 mg at 06/25/21 0816   fluticasone furoate-vilanterol (BREO ELLIPTA) 200-25 MCG/INH 1 puff  1 puff Inhalation Daily Clapacs, John T, MD       hydrOXYzine (ATARAX/VISTARIL) tablet 25 mg  25 mg Oral TID PRN Ethelene Hal, NP        liothyronine (CYTOMEL) tablet 10 mcg  10 mcg Oral Daily Massengill, Ovid Curd, MD   10 mcg at 06/25/21 0816   magnesium hydroxide (MILK OF MAGNESIA) suspension 30 mL  30 mL Oral Daily PRN Clapacs, John T, MD   30 mL at 06/23/21 0618   OLANZapine (ZYPREXA) tablet 5 mg  5 mg Oral QHS Buna Cuppett I, NP   5 mg at 06/24/21 2111   simvastatin (ZOCOR) tablet 20 mg  20 mg Oral q1800 Ethelene Hal, NP   20 mg at 06/24/21 2111   traZODone (DESYREL) tablet 100 mg  100 mg Oral QHS PRN Clapacs, Madie Reno, MD   100 mg at 06/24/21 2111   Lab Results:  No results found for this or any previous visit (from the past 48 hour(s)).  Blood Alcohol level:  Lab Results  Component Value Date   ETH <10 33/29/5188   Metabolic Disorder Labs: Lab Results  Component Value Date   HGBA1C 5.5 06/19/2021   MPG 111.15 06/19/2021   No results found for: PROLACTIN Lab Results  Component Value Date   CHOL 184 06/19/2021   TRIG 76 06/19/2021   HDL 62 06/19/2021   CHOLHDL 3.0 06/19/2021   VLDL 15 06/19/2021   LDLCALC 107 (H) 06/19/2021   LDLCALC 129 (H) 08/23/2017   Physical Findings: AIMS: Facial and Oral Movements Muscles of Facial Expression: None, normal Lips and Perioral Area: None, normal Jaw: None, normal Tongue: None, normal,Extremity Movements Upper (arms, wrists, hands, fingers): None, normal Lower (legs, knees, ankles, toes): None, normal, Trunk Movements Neck, shoulders, hips: None, normal, Overall Severity Severity of abnormal movements (highest score from questions above): None, normal Incapacitation due to abnormal movements: None, normal Patient's awareness of abnormal movements (rate only patient's report): No Awareness, Dental Status Current problems with teeth and/or dentures?: No Does patient usually wear dentures?: No   Musculoskeletal: Strength & Muscle Tone: within normal limits Gait & Station: normal Patient leans: N/A  Psychiatric Specialty Exam:  Presentation  General  Appearance: Appropriate for Environment; Casual; Fairly Groomed  Eye Contact:Good  Speech:Normal Rate; Clear and Coherent  Speech Volume:Normal  Handedness:Right  Mood and Affect  Mood:Anxious; Depressed  Affect:Congruent; Constricted;  Depressed , improving since admisison  Thought Process  Thought Processes:Linear; Coherent; Goal Directed  Descriptions of Associations:Intact  Orientation:Full (Time, Place and Person)  Thought Content:Logical  History of Schizophrenia/Schizoaffective disorder:No Duration of Psychotic Symptoms: N/A Hallucinations:Denies  Ideas of Reference:None  Suicidal Thoughts: denies  Homicidal Thoughts: denies   Sensorium  Memory:Immediate Good; Recent Good; Remote Good  Judgment:Fair  Insight:Fair  Executive Functions  Concentration:Good  Attention Span:Good  [place]  Language:Good  Psychomotor Activity  Psychomotor Activity:Normal  Assets  Assets:Communication Skills; Desire for Improvement; Housing; Resilience; Social Support; Physical Health; Vocational/Educational  Sleep  Sleep: Fair 6.75 hours  Physical Exam: Physical Exam Vitals and nursing note reviewed.  Constitutional:      General: He is not in acute distress.    Appearance: Normal appearance. He is not ill-appearing or toxic-appearing.  HENT:     Head: Normocephalic.  Pulmonary:     Effort: Pulmonary effort is normal.  Musculoskeletal:        General: Normal range of motion.     Cervical back: Normal range of motion.  Neurological:     General: No focal deficit present.     Mental Status: He is alert and oriented to person, place, and time.     Motor: No weakness.     Gait: Gait normal.  Psychiatric:        Attention and Perception: Attention and perception normal. He does not perceive auditory or visual hallucinations.        Mood and Affect: Mood is anxious and depressed.        Speech: Speech normal.        Behavior:  Behavior normal. Behavior is cooperative.        Thought Content: Thought content normal. Thought content is not paranoid or delusional. Thought content does not include homicidal or suicidal ideation. Thought content does not include homicidal or suicidal plan.        Cognition and Memory: Cognition normal.   Review of Systems  Constitutional: Negative.  Negative for fever.  HENT: Negative.  Negative for congestion, sinus pain and sore throat.   Respiratory: Negative.  Negative for cough and shortness of breath.   Cardiovascular: Negative.  Negative for chest pain.  Gastrointestinal: Negative.   Genitourinary: Negative.   Musculoskeletal: Negative.   Neurological: Negative.   Psychiatric/Behavioral:  Positive for depression. The patient is nervous/anxious.   All other systems reviewed and are negative.  Blood pressure 140/84, pulse 76, temperature 98 F (36.7 C), temperature source Oral, resp. rate 20, height 5\' 8"  (1.727 m), weight 83.9 kg, SpO2 97 %. Body mass index is 28.13 kg/m.  Treatment Plan Summary: Daily contact with patient to assess and evaluate symptoms and progress in treatment and Medication management  Assessment: -Diagnosis is likely major depressive disorder, severe, recurrent, without psychotic features.  However we are also considering the diagnosis of bipolar disorder, type II, current depressive episode.  We are considering bipolar disorder, due to vague possible hypomanic symptoms in the past, most recently occurring about 3 years ago.  Additionally there is a family history of bipolar disorder and failure multiple first-line antidepressant medication trials.  -GAD -h/o prostate cancer   Plan: -Continue cytomel 10 mcg once daily for augmentation of depression. Denies h/o cardiac disease. TSH wnl.  -Continue Prozac 40 mg PO daily starting 10/20 -Continue Olanzapine 5 mg po QH to augment antidepressant. -Continue Vistaril 25 mg PO TID PRN for break-through  anxiety. -Continue Clonazepam 0.5 mg po  to Q 12 hours for anxiety. -Continue Trazodone 100 mg PO PRN for insomnia.   Labs reviewed 10/15 CBC with RBC 5.84, Hgb 17.3, HCT 53.1, TSH 1.948.   Treat medical problems as indicated (resumed meds). Amlodipine 10 mg-benazepril 20 mg PO daily for HTN. Breo Ellipta 200-25 mcg 1 puff daily for COPD. Simvastatin 20 mg PO every evening for high cholesterol.   Other prn medications: continue, Acetaminophen 650 mg PO every 6 hrs PRN for pain/fever. Mylanta 30 ml PO every 4 hrs PRN for indigestion.  MOM 30 ml PO  daily PRN for constipation.  Develop a treatment plan to decrease risk of relapse upon discharge and the need for readmission.  Suggest Health care follow up as needed for medical problems.  Call for consults with hospitalist for any additional specialty patient care services as needed  Continue every 15 minute safety checks Encourage participation in the therapeutic milieu Discharge planning in progress  Lindell Spar, NP, pmhnp, fnp-bc 06/25/2021, 6:02 PM Patient ID: Dennie Maizes, male   DOB: 1961/07/22, 60 y.o.   MRN: [id number]

## 2021-06-25 NOTE — BHH Group Notes (Signed)
Adult Psychoeducational Group Note  Date:  06/25/2021 Time:  3:18 PM  Group Topic/Focus:  Wellness Toolbox:   The focus of this group is to discuss various aspects of wellness, balancing those aspects and exploring ways to increase the ability to experience wellness.  Patients will create a wellness toolbox for use upon discharge.  Participation Level:  Active  Participation Quality:  Attentive  Affect:  Appropriate  Cognitive:  Alert  Insight: Appropriate  Engagement in Group:  Engaged  Modes of Intervention:  Activity  Additional Comments:  Patient attended and participated in the relaxation group.  Annie Sable 06/25/2021, 3:18 PM

## 2021-06-25 NOTE — Group Note (Unsigned)
Group Topic: Goal Setting  Group Date: 06/25/2021 Start Time: 0900 End Time: 1000 Facilitators: Jerrye Beavers, NT  Department: [place] ADULT [facility]  Number of Participants: 9  Group Focus: abuse issues Treatment Modality:  Behavior Modification Therapy Interventions utilized were clarification Purpose: enhance coping skills   Name: Gerald Boyer Date of Birth: 03/04/1961  MR: [id number]    Level of Participation: {THERAPIES; PSYCH GROUP PARTICIPATION UDTHY:38887} Quality of Participation: {THERAPIES; PSYCH QUALITY OF PARTICIPATION:23992} Interactions with others: {THERAPIES; PSYCH INTERACTIONS:23993} Mood/Affect: {THERAPIES; PSYCH MOOD/AFFECT:23994} Triggers (if applicable): *** Cognition: {THERAPIES; PSYCH COGNITION:23995} Progress: {THERAPIES; PSYCH PROGRESS:23997} Response: *** Plan: {THERAPIES; PSYCH NZVJ:28206}  Patients Problems:  Patient Active Problem List   Diagnosis Date Noted   GAD (generalized anxiety disorder) 06/19/2021   Severe recurrent major depression without psychotic features ([place]) 06/18/2021   Prostate cancer ([place]) 12/30/2017   Diverticulitis of large intestine with perforation without bleeding 11/28/2017   Obesity 08/23/2017   Elevated prostate specific antigen (PSA) 08/04/2015   Essential (primary) hypertension 08/04/2015   Ruptured tympanic membrane 08/04/2015   Extrapyramidal disease 08/04/2015   Hypertriglyceridemia 06/27/2014   History of colonic polyps 02/21/2012   Panic disorder (episodic paroxysmal anxiety) 09/06/2001   Allergic rhinitis due to pollen 09/06/1998

## 2021-06-25 NOTE — Group Note (Unsigned)
Group Topic: Goal Setting  Group Date: 06/25/2021 Start Time: 1000 End Time: 1100 Facilitators: Kathreen Cornfield  Department: [place] ADULT [facility]  Number of Participants: 5  Group Focus: coping skills Treatment Modality:  Skills Training Interventions utilized were patient education Purpose: improve communication skills   Name: Gerald Boyer Date of Birth: 15-Jul-1961  MR: [id number]    Level of Participation: {THERAPIES; PSYCH GROUP PARTICIPATION DHRCB:63845} Quality of Participation: {THERAPIES; PSYCH QUALITY OF PARTICIPATION:23992} Interactions with others: {THERAPIES; PSYCH INTERACTIONS:23993} Mood/Affect: {THERAPIES; PSYCH MOOD/AFFECT:23994} Triggers (if applicable): *** Cognition: {THERAPIES; PSYCH COGNITION:23995} Progress: {THERAPIES; PSYCH PROGRESS:23997} Response: *** Plan: {THERAPIES; PSYCH XMIW:80321}  Patients Problems:  Patient Active Problem List   Diagnosis Date Noted   GAD (generalized anxiety disorder) 06/19/2021   Severe recurrent major depression without psychotic features ([place]) 06/18/2021   Prostate cancer ([place]) 12/30/2017   Diverticulitis of large intestine with perforation without bleeding 11/28/2017   Obesity 08/23/2017   Elevated prostate specific antigen (PSA) 08/04/2015   Essential (primary) hypertension 08/04/2015   Ruptured tympanic membrane 08/04/2015   Extrapyramidal disease 08/04/2015   Hypertriglyceridemia 06/27/2014   History of colonic polyps 02/21/2012   Panic disorder (episodic paroxysmal anxiety) 09/06/2001   Allergic rhinitis due to pollen 09/06/1998

## 2021-06-25 NOTE — Progress Notes (Signed)
Adult Psychoeducational Group Note  Date:  06/25/2021 Time:  9:18 PM  Group Topic/Focus:  Wrap-Up Group:   The focus of this group is to help patients review their daily goal of treatment and discuss progress on daily workbooks.  Participation Level:  Active  Participation Quality:  Appropriate  Affect:  Appropriate  Cognitive:  Appropriate  Insight: Appropriate  Engagement in Group:  Engaged  Modes of Intervention:  Discussion  Additional Comments: Patient attend wrap up group. His day was a 5. His goal for today was to talk to Education officer, museum and he did not achieved his goal. Coping skills walking talking deep breathing. The color to describe his day purple.  Lenice Llamas Long 06/25/2021, 9:18 PM

## 2021-06-25 NOTE — BHH Group Notes (Signed)
Pt attended crisis management group.

## 2021-06-25 NOTE — Progress Notes (Signed)
Pt visible on the unit, pt given PRN Trazodone per [facility] with HS medication    06/25/21 2100  Psych Admission Type (Psych Patients Only)  Admission Status Voluntary  Psychosocial Assessment  Patient Complaints Depression  Eye Contact Fair  Facial Expression Anxious  Affect Appropriate to circumstance  Speech Logical/coherent;Soft  Interaction Minimal  Motor Activity Slow  Appearance/Hygiene In scrubs  Behavior Characteristics Cooperative  Aggressive Behavior  Effect No apparent injury  Thought Process  Coherency WDL  Content WDL  Delusions None reported or observed  Perception WDL  Hallucination None reported or observed  Judgment Poor  Confusion None  Danger to Self  Current suicidal ideation? Denies  Self-Injurious Behavior No self-injurious ideation or behavior indicators observed or expressed   Agreement Not to Harm Self Yes  Description of Agreement Verbal Contract  Danger to Others  Danger to Others None reported or observed

## 2021-06-25 NOTE — Progress Notes (Signed)
DAR NOTE: Patient presents with a flat affect and depressed mood.  Denies suicidal thoughts, auditory and visual hallucinations.  Described energy level as low with poor concentration.  Rates depression at 6, hopelessness at 3, and anxiety at 5.  Maintained on routine safety checks.  Medications given as prescribed.  Support and encouragement offered as needed.  Attended group and participated.  States goal for today is "my self confidence."  Patient visible in milieu with minimal interaction.  Patient is safe on and off the unit.

## 2021-06-26 DIAGNOSIS — F332 Major depressive disorder, recurrent severe without psychotic features: Secondary | ICD-10-CM | POA: Diagnosis not present

## 2021-06-26 MED ORDER — FLUOXETINE HCL 20 MG PO CAPS
50.0000 mg | ORAL_CAPSULE | Freq: Every day | ORAL | Status: AC
  Administered 2021-06-27: 50 mg via ORAL
  Filled 2021-06-26: qty 1

## 2021-06-26 MED ORDER — FLUOXETINE HCL 20 MG PO CAPS
60.0000 mg | ORAL_CAPSULE | Freq: Every day | ORAL | Status: DC
  Administered 2021-06-28 – 2021-06-29 (×2): 60 mg via ORAL
  Filled 2021-06-26 (×3): qty 3

## 2021-06-26 NOTE — [facility] Group Notes (Signed)
Adult Psychoeducational Group Note  Date:  06/26/2021 Time:  5:17 PM  Group Topic/Focus:  Coping With Mental Health Crisis:   The purpose of this group is to help patients identify strategies for coping with mental health crisis.  Group discusses possible causes of crisis and ways to manage them effectively.  Participation Level:  Active  Participation Quality:  Appropriate  Affect:  Appropriate  Cognitive:  Appropriate  Insight: Appropriate  Engagement in Group:  Supportive  Modes of Intervention:  Discussion  Additional Comments:  Pt uses spending time with animals to cope  Deeann Dowse 06/26/2021, 5:17 PM

## 2021-06-26 NOTE — Progress Notes (Signed)
Pt visible on the unit this evening, pt stated he had a good day and feeling better. Pt given PRN Trazodone with HS medication    06/26/21 2000  Psych Admission Type (Psych Patients Only)  Admission Status Voluntary  Psychosocial Assessment  Patient Complaints Depression  Eye Contact Fair  Facial Expression Anxious  Affect Appropriate to circumstance  Speech Logical/coherent;Soft  Interaction Minimal  Motor Activity Slow  Appearance/Hygiene In scrubs  Behavior Characteristics Cooperative  Mood Depressed  Aggressive Behavior  Effect No apparent injury  Thought Process  Coherency WDL  Content WDL  Delusions None reported or observed  Perception WDL  Hallucination None reported or observed  Judgment Poor  Confusion None  Danger to Self  Current suicidal ideation? Denies  Self-Injurious Behavior No self-injurious ideation or behavior indicators observed or expressed   Agreement Not to Harm Self Yes  Description of Agreement Verbal Contract  Danger to Others  Danger to Others None reported or observed

## 2021-06-26 NOTE — Group Note (Signed)
LCSW Group Therapy Note  Group Date: 06/26/2021 Start Time: 1300 End Time: 1330   Type of Therapy and Topic:  Group Therapy - Healthy vs Unhealthy Coping Skills  Participation Level:  Active   Description of Group The focus of this group was to determine what unhealthy coping techniques typically are used by group members and what healthy coping techniques would be helpful in coping with various problems. Patients were guided in becoming aware of the differences between healthy and unhealthy coping techniques. Patients were asked to identify 2-3 healthy coping skills they would like to learn to use more effectively.  Therapeutic Goals Patients learned that coping is what human beings do all day long to deal with various situations in their lives Patients defined and discussed healthy vs unhealthy coping techniques Patients identified their preferred coping techniques and identified whether these were healthy or unhealthy Patients determined 2-3 healthy coping skills they would like to become more familiar with and use more often. Patients provided support and ideas to each other   Summary of Patient Progress:   Due to the acuity and complex discharge plans, group was not held. Patient was provided therapeutic worksheets and asked to meet with CSW as needed.    Therapeutic Modalities Cognitive Behavioral Therapy Motivational Interviewing  Eliott Nine 06/26/2021  1:18 PM

## 2021-06-26 NOTE — [facility] Group Notes (Signed)
Pt goal for today is to work on confidence. Pt has been using affirmations to complete this goal.  Pt is looking forward to meeting new people and being active.

## 2021-06-26 NOTE — Progress Notes (Signed)
   06/26/21 1500  Psych Admission Type (Psych Patients Only)  Admission Status Voluntary  Psychosocial Assessment  Patient Complaints Depression  Eye Contact Fair  Facial Expression Anxious  Affect Appropriate to circumstance  Speech Logical/coherent;Soft  Interaction Minimal  Motor Activity Slow  Appearance/Hygiene In scrubs  Behavior Characteristics Cooperative  Mood Depressed  Aggressive Behavior  Effect No apparent injury  Thought Process  Coherency WDL  Content WDL  Delusions None reported or observed  Perception WDL  Hallucination None reported or observed  Judgment Poor  Confusion None  Danger to Self  Current suicidal ideation? Denies  Self-Injurious Behavior No self-injurious ideation or behavior indicators observed or expressed   Agreement Not to Harm Self Yes  Description of Agreement Verbal Contract  Danger to Others  Danger to Others None reported or observed

## 2021-06-26 NOTE — Progress Notes (Signed)
[facility] MD Progress Note  06/26/2021 6:05 PM Gerald Boyer  MRN:  [id number]  Subjective:  Patient is a 60 year old Caucasian male with hx of major depressive disorder & medical diagnosis that includes; s/p prostate cancer, HTN, Hyperlipidemia & COPD. Admitted to the [facility] from the [facility] with complaint of worsening symptoms of depression, anxiety/panic symptoms, poor concentration, excessive worrying, forgetfulness, decreased sleep & hopelessness & suicidal ideations. Patient apparently has battled depression most of his life & was most recently had received mental health treatment under the care of Dr. Nicolasa Ducking.  Evaluation on the unit today, 10/21: Patient was seen, chart reviewed and case discussed with the treatment team.  Daily notes 06/26/21: Gerald Boyer reports today that his mood is pretty good. He says he he is looking forward to getting discharged next week.  He says although feeling a little apprehensive about going back to same situation after discharge, he has learned from being here that he can count on himself again. He says he thinks that he is benefiting from his medications, denies any side effects. He says he is sleeping well at night. Reports good appetite. Taking & tolerating his treatment regimen. He denies any SIHI, AVH, delusional thoughts or paranoia. He does not appear to be responding to any internal stimuli. There no changes made on his current plan of care. He remains active in the group milieu.    Principal Problem: Severe recurrent major depression without psychotic features ([place]) Diagnosis: Principal Problem:   Severe recurrent major depression without psychotic features ([place]) Active Problems:   Panic disorder (episodic paroxysmal anxiety)   GAD (generalized anxiety disorder)  Total Time spent with patient:  25 minutes  Past Psychiatric History:  Major depressive disorder  Past Medical History:  Past Medical History:  Diagnosis Date   Allergy    Anxiety    Cancer  ([place])    Diverticulitis    Hyperlipidemia    Hypertension    Prostate cancer [facility])     Past Surgical History:  Procedure Laterality Date   LIPOMA EXCISION     located on  left shoulder   LYMPHADENECTOMY Bilateral 12/30/2017   Procedure: LYMPHADENECTOMY;  Surgeon: Gerald Frock, MD;  Location: [facility];  Service: Urology;  Laterality: Bilateral;   PROSTATE BIOPSY  01/2011   outpatient, Dr. Eliberto Boyer; 05/2011- mild chronic inflammation, no dysplasia   ROBOT ASSISTED LAPAROSCOPIC RADICAL PROSTATECTOMY N/A 12/30/2017   Procedure: XI ROBOTIC ASSISTED LAPAROSCOPIC RADICAL PROSTATECTOMY WITH PELVIC LYMPHADENECTOMY AND INJECTION OF INDOCYANINE GREEN DYE;  Surgeon: Gerald Frock, MD;  Location: [facility];  Service: Urology;  Laterality: N/A;   TONSILLECTOMY     VASECTOMY  1995   Family History:  Family History  Problem Relation Age of Onset   COPD Mother    Kidney cancer Mother    Prostate cancer Father        treated with radiation   Rectal cancer Daughter    Hypertension Other    Breast cancer Neg Hx    Colon cancer Neg Hx    Pancreatic cancer Neg Hx    Family Psychiatric  History: Bipolar disorder: Maternal grandmother  Social History:  Social History   Substance and Sexual Activity  Alcohol Use Yes   Comment: 2-4 beers/day     Social History   Substance and Sexual Activity  Drug Use No   Comment: + benzos and MDMA    Social History   Socioeconomic History   Marital status: Single    Spouse name: Gerald Penna  Boyer   Number of children: 3   Years of education: Not on file   Highest education level: Not on file  Occupational History   Occupation: Inside sales  Tobacco Use   Smoking status: Never   Smokeless tobacco: Never  Vaping Use   Vaping Use: Never used  Substance and Sexual Activity   Alcohol use: Yes    Comment: 2-4 beers/day   Drug use: No    Comment: + benzos and MDMA   Sexual activity: Not Currently  Other Topics Concern   Not on file  Social History  Narrative   Divorced. Has two daughters and one son. Resides in [place] with significant other Gerald Boyer.   Social Determinants of Health   Financial Resource Strain: Not on file  Food Insecurity: Not on file  Transportation Needs: Not on file  Physical Activity: Not on file  Stress: Not on file  Social Connections: Not on file   Additional Social History:    Divorced, has 3 children, unemployed, lives in [place], [place].    Sleep: Good, 6.75 hrs per documentation.  Appetite:  Good,   Current Medications: Current Facility-Administered Medications  Medication Dose Route Frequency Provider Last Rate Last Admin   acetaminophen (TYLENOL) tablet 650 mg  650 mg Oral Q6H PRN Clapacs, Gerald T, MD       alum & mag hydroxide-simeth (MAALOX/MYLANTA) 200-200-20 MG/5ML suspension 30 mL  30 mL Oral Q4H PRN Clapacs, Gerald T, MD       amLODipine (NORVASC) tablet 10 mg  10 mg Oral Daily Clapacs, Gerald T, MD   10 mg at 06/26/21 5400   And   benazepril (LOTENSIN) tablet 20 mg  20 mg Oral Daily Clapacs, Gerald T, MD   20 mg at 06/26/21 8676   clonazePAM (KLONOPIN) tablet 0.5 mg  0.5 mg Oral Q12H Brayah Urquilla I, NP   0.5 mg at 06/26/21 1950   FLUoxetine (PROZAC) capsule 50 mg  50 mg Oral Daily Gerald Boyer, Nathan, MD   50 mg at 06/26/21 0827   fluticasone furoate-vilanterol (BREO ELLIPTA) 200-25 MCG/INH 1 puff  1 puff Inhalation Daily Clapacs, Gerald T, MD       hydrOXYzine (ATARAX/VISTARIL) tablet 25 mg  25 mg Oral TID PRN Ethelene Hal, NP       liothyronine (CYTOMEL) tablet 10 mcg  10 mcg Oral Daily Gerald Boyer, Nathan, MD   10 mcg at 06/26/21 0827   magnesium hydroxide (MILK OF MAGNESIA) suspension 30 mL  30 mL Oral Daily PRN Clapacs, Gerald T, MD   30 mL at 06/23/21 0618   OLANZapine (ZYPREXA) tablet 5 mg  5 mg Oral QHS Gerald Boyer I, NP   5 mg at 06/25/21 2113   simvastatin (ZOCOR) tablet 20 mg  20 mg Oral q1800 Ethelene Hal, NP   20 mg at 06/25/21 2112   traZODone (DESYREL) tablet 100 mg   100 mg Oral QHS PRN Clapacs, Madie Reno, MD   100 mg at 06/25/21 2112   Lab Results:  No results found for this or any previous visit (from the past 48 hour(s)).  Blood Alcohol level:  Lab Results  Component Value Date   ETH <10 93/26/7124   Metabolic Disorder Labs: Lab Results  Component Value Date   HGBA1C 5.5 06/19/2021   MPG 111.15 06/19/2021   No results found for: PROLACTIN Lab Results  Component Value Date   CHOL 184 06/19/2021   TRIG 76 06/19/2021   HDL 62 06/19/2021   CHOLHDL  3.0 06/19/2021   VLDL 15 06/19/2021   LDLCALC 107 (H) 06/19/2021   LDLCALC 129 (H) 08/23/2017   Physical Findings: AIMS: Facial and Oral Movements Muscles of Facial Expression: None, normal Lips and Perioral Area: None, normal Jaw: None, normal Tongue: None, normal,Extremity Movements Upper (arms, wrists, hands, fingers): None, normal Lower (legs, knees, ankles, toes): None, normal, Trunk Movements Neck, shoulders, hips: None, normal, Overall Severity Severity of abnormal movements (highest score from questions above): None, normal Incapacitation due to abnormal movements: None, normal Patient's awareness of abnormal movements (rate only patient's report): No Awareness, Dental Status Current problems with teeth and/or dentures?: No Does patient usually wear dentures?: No   Musculoskeletal: Strength & Muscle Tone: within normal limits Gait & Station: normal Patient leans: N/A  Psychiatric Specialty Exam:  Presentation  General Appearance: Appropriate for Environment; Casual; Fairly Groomed  Eye Contact:Good  Speech:Normal Rate; Clear and Coherent  Speech Volume:Normal  Handedness:Right  Mood and Affect  Mood:Anxious; Depressed  Affect:Congruent; Constricted; Depressed , improving since admisison  Thought Process  Thought Processes:Linear; Coherent; Goal Directed  Descriptions of Associations:Intact  Orientation:Full (Time, Place and Person)  Thought  Content:Logical  History of Schizophrenia/Schizoaffective disorder:No Duration of Psychotic Symptoms: N/A Hallucinations:Denies  Ideas of Reference:None  Suicidal Thoughts: denies  Homicidal Thoughts: denies   Sensorium  Memory:Immediate Good; Recent Good; Remote Good  Judgment:Fair  Insight:Fair  Executive Functions  Concentration:Good  Attention Span:Good  [place]  Language:Good  Psychomotor Activity  Psychomotor Activity:Normal  Assets  Assets:Communication Skills; Desire for Improvement; Housing; Resilience; Social Support; Physical Health; Vocational/Educational  Sleep  Sleep: Fair 6.75 hours  Physical Exam: Physical Exam Vitals and nursing note reviewed.  Constitutional:      General: He is not in acute distress.    Appearance: Normal appearance. He is not ill-appearing or toxic-appearing.  HENT:     Head: Normocephalic.  Pulmonary:     Effort: Pulmonary effort is normal.  Musculoskeletal:        General: Normal range of motion.     Cervical back: Normal range of motion.  Neurological:     General: No focal deficit present.     Mental Status: He is alert and oriented to person, place, and time.     Motor: No weakness.     Gait: Gait normal.  Psychiatric:        Attention and Perception: Attention and perception normal. He does not perceive auditory or visual hallucinations.        Mood and Affect: Mood is anxious and depressed.        Speech: Speech normal.        Behavior: Behavior normal. Behavior is cooperative.        Thought Content: Thought content normal. Thought content is not paranoid or delusional. Thought content does not include homicidal or suicidal ideation. Thought content does not include homicidal or suicidal plan.        Cognition and Memory: Cognition normal.   Review of Systems  Constitutional: Negative.  Negative for fever.  HENT: Negative.  Negative for congestion, sinus pain and sore throat.    Respiratory: Negative.  Negative for cough and shortness of breath.   Cardiovascular: Negative.  Negative for chest pain.  Gastrointestinal: Negative.   Genitourinary: Negative.   Musculoskeletal: Negative.   Neurological: Negative.   Psychiatric/Behavioral:  Positive for depression. The patient is nervous/anxious.   All other systems reviewed and are negative.  Blood pressure 113/75, pulse 95, temperature 97.6 F (  36.4 C), temperature source Oral, resp. rate 20, height 5\' 8"  (1.727 m), weight 83.9 kg, SpO2 97 %. Body mass index is 28.13 kg/m.  Treatment Plan Summary: Daily contact with patient to assess and evaluate symptoms and progress in treatment and Medication management  Assessment: -Diagnosis is likely major depressive disorder, severe, recurrent, without psychotic features.  However we are also considering the diagnosis of bipolar disorder, type II, current depressive episode.  We are considering bipolar disorder, due to vague possible hypomanic symptoms in the past, most recently occurring about 3 years ago.  Additionally there is a family history of bipolar disorder and failure multiple first-line antidepressant medication trials.  -GAD -h/o prostate cancer   Plan: -Continue cytomel 10 mcg once daily for augmentation of depression. Denies h/o cardiac disease. TSH wnl.  -Continue Prozac 50 mg PO daily starting 10/21 -Continue Olanzapine 5 mg po QH to augment antidepressant. -Continue Vistaril 25 mg PO TID PRN for break-through anxiety. -Continue Clonazepam 0.5 mg po to Q 12 hours for anxiety. -Continue Trazodone 100 mg PO PRN for insomnia.   Labs reviewed 10/15 CBC with RBC 5.84, Hgb 17.3, HCT 53.1, TSH 1.948.   Treat medical problems as indicated (resumed meds). Amlodipine 10 mg-benazepril 20 mg PO daily for HTN. Breo Ellipta 200-25 mcg 1 puff daily for COPD. Simvastatin 20 mg PO every evening for high cholesterol.   Other prn medications: continue, Acetaminophen  650 mg PO every 6 hrs PRN for pain/fever. Mylanta 30 ml PO every 4 hrs PRN for indigestion.  MOM 30 ml PO  daily PRN for constipation.  Develop a treatment plan to decrease risk of relapse upon discharge and the need for readmission.  Suggest Health care follow up as needed for medical problems.  Call for consults with hospitalist for any additional specialty patient care services as needed  Continue every 15 minute safety checks Encourage participation in the therapeutic milieu Discharge planning in progress  Gerald Spar, NP, pmhnp, fnp-bc 06/26/2021, 6:05 PM Patient ID: Dennie Maizes, male   DOB: 1961-09-06, 60 y.o.   MRN: [id number] Patient ID: Kamuela Magos, male   DOB: 1961/05/15, 60 y.o.   MRN: [id number]

## 2021-06-26 NOTE — Progress Notes (Signed)
Adult Psychoeducational Group Note  Date:  06/26/2021 Time:  9:18 PM  Group Topic/Focus:  Wrap-Up Group:   The focus of this group is to help patients review their daily goal of treatment and discuss progress on daily workbooks.  Participation Level:  Active  Participation Quality:  Appropriate  Affect:  Appropriate  Cognitive:  Appropriate  Insight: Appropriate  Engagement in Group:  Improving  Modes of Intervention:  Discussion  Additional Comments:  Pt stated his goal for today was to focus on his treatment plan. Pt stated he accomplished his goal today. Pt stated he talked with his doctor but did not get a chance to talk with his social worker about his care today. Pt rated his overall day a 6 out of 10. Pt stated he was able to contact his girlfriend today which improved his overall day. Pt stated he felt better about himself today. Pt stated he was able to attend all meals. Pt stated he took all medications provided today. Pt stated he attend all groups held today. Pt stated his appetite was pretty good today. Pt rated sleep last night was pretty good. Pt stated the goal tonight was to get some rest. Pt stated he had no physical pain today. Pt deny visual hallucinations and auditory issues tonight. Pt denies thoughts of harming himself or others. Pt stated he would alert staff if anything changed.  Candy Sledge 06/26/2021, 9:18 PM

## 2021-06-26 NOTE — Group Note (Signed)
Recreation Therapy Group Note   Group Topic:Stress Management  Group Date: 06/26/2021 Start Time: 0930 End Time: 0945 Facilitators: Victorino Sparrow, LRT/CTRS Location: [address] Dayroom  Goal Area(s) Addresses:  Patient will actively participate in stress management techniques presented during session.  Patient will successfully identify benefit of practicing stress management post d/c.   Group Description: Meditation.  LRT played a meditation that focused on looking at each day as a new beginning to try new things, make a new friend, take time for yourself or make peace with any individual you may have an issue with.  Patients were to listen and follow along as meditation played to engage in activity.   Affect/Mood: N/A   Participation Level: Did not attend    Clinical Observations/Individualized Feedback: Pt did not attend group.    Plan: Continue to engage patient in RT group sessions 2-3x/week.   Victorino Sparrow, LRT/CTRS 06/26/2021 12:18 PM

## 2021-06-27 NOTE — Progress Notes (Signed)
[facility] MD Progress Note  06/27/2021 3:13 PM Yani Lal  MRN:  [id number]  Subjective:  Patient is a 60 year old Caucasian male with hx of major depressive disorder & medical diagnosis that includes; s/p prostate cancer, HTN, Hyperlipidemia & COPD. Admitted to the [facility] from the [facility] with complaint of worsening symptoms of depression, anxiety/panic symptoms, poor concentration, excessive worrying, forgetfulness, decreased sleep & hopelessness & suicidal ideations. Patient apparently has battled depression most of his life & was most recently had received mental health treatment under the care of Dr. Nicolasa Ducking.  Evaluation on the unit today, 10/22: Patient was seen, chart reviewed and case discussed with the treatment team.  Daily notes 06/26/21: Luie reports today that his mood is pretty good. He says he he is looking forward to getting discharged next week.  He says he thinks that he is benefiting from his medications, denies any side effects. He says he is sleeping well at night. Reports good appetite. Taking & tolerating his treatment regimen. He denies any SIHI, AVH, delusional thoughts or paranoia. He does not appear to be responding to any internal stimuli. There no changes made on his current plan of care. He remains active in the group milieu.    Principal Problem: Severe recurrent major depression without psychotic features ([place]) Diagnosis: Principal Problem:   Severe recurrent major depression without psychotic features ([place]) Active Problems:   Panic disorder (episodic paroxysmal anxiety)   GAD (generalized anxiety disorder)  Total Time spent with patient:  25 minutes  Past Psychiatric History:  Major depressive disorder  Past Medical History:  Past Medical History:  Diagnosis Date   Allergy    Anxiety    Cancer ([place])    Diverticulitis    Hyperlipidemia    Hypertension    Prostate cancer [facility])     Past Surgical History:  Procedure Laterality Date   LIPOMA EXCISION      located on  left shoulder   LYMPHADENECTOMY Bilateral 12/30/2017   Procedure: LYMPHADENECTOMY;  Surgeon: Alexis Frock, MD;  Location: [facility];  Service: Urology;  Laterality: Bilateral;   PROSTATE BIOPSY  01/2011   outpatient, Dr. Eliberto Ivory; 05/2011- mild chronic inflammation, no dysplasia   ROBOT ASSISTED LAPAROSCOPIC RADICAL PROSTATECTOMY N/A 12/30/2017   Procedure: XI ROBOTIC ASSISTED LAPAROSCOPIC RADICAL PROSTATECTOMY WITH PELVIC LYMPHADENECTOMY AND INJECTION OF INDOCYANINE GREEN DYE;  Surgeon: Alexis Frock, MD;  Location: [facility];  Service: Urology;  Laterality: N/A;   TONSILLECTOMY     VASECTOMY  1995   Family History:  Family History  Problem Relation Age of Onset   COPD Mother    Kidney cancer Mother    Prostate cancer Father        treated with radiation   Rectal cancer Daughter    Hypertension Other    Breast cancer Neg Hx    Colon cancer Neg Hx    Pancreatic cancer Neg Hx    Family Psychiatric  History: Bipolar disorder: Maternal grandmother  Social History:  Social History   Substance and Sexual Activity  Alcohol Use Yes   Comment: 2-4 beers/day     Social History   Substance and Sexual Activity  Drug Use No   Comment: + benzos and MDMA    Social History   Socioeconomic History   Marital status: Single    Spouse name: IT sales professional   Number of children: 3   Years of education: Not on file   Highest education level: Not on file  Occupational History  Occupation: Engineer, water  Tobacco Use   Smoking status: Never   Smokeless tobacco: Never  Vaping Use   Vaping Use: Never used  Substance and Sexual Activity   Alcohol use: Yes    Comment: 2-4 beers/day   Drug use: No    Comment: + benzos and MDMA   Sexual activity: Not Currently  Other Topics Concern   Not on file  Social History Narrative   Divorced. Has two daughters and one son. Resides in [place] with significant other [name].   Social Determinants of Health   Financial Resource Strain:  Not on file  Food Insecurity: Not on file  Transportation Needs: Not on file  Physical Activity: Not on file  Stress: Not on file  Social Connections: Not on file   Additional Social History:    Divorced, has 3 children, unemployed, lives in [place], [place].    Sleep: Good, 6.75 hrs per documentation.  Appetite:  Good,   Current Medications: Current Facility-Administered Medications  Medication Dose Route Frequency Provider Last Rate Last Admin   acetaminophen (TYLENOL) tablet 650 mg  650 mg Oral Q6H PRN Clapacs, John T, MD       alum & mag hydroxide-simeth (MAALOX/MYLANTA) 200-200-20 MG/5ML suspension 30 mL  30 mL Oral Q4H PRN Clapacs, John T, MD       amLODipine (NORVASC) tablet 10 mg  10 mg Oral Daily Clapacs, John T, MD   10 mg at 06/27/21 6948   And   benazepril (LOTENSIN) tablet 20 mg  20 mg Oral Daily Clapacs, John T, MD   20 mg at 06/27/21 5462   clonazePAM (KLONOPIN) tablet 0.5 mg  0.5 mg Oral Q12H Salome Cozby I, NP   0.5 mg at 06/27/21 0823   [START ON 06/28/2021] FLUoxetine (PROZAC) capsule 60 mg  60 mg Oral Daily Massengill, Nathan, MD       fluticasone furoate-vilanterol (BREO ELLIPTA) 200-25 MCG/INH 1 puff  1 puff Inhalation Daily Clapacs, John T, MD       hydrOXYzine (ATARAX/VISTARIL) tablet 25 mg  25 mg Oral TID PRN Ethelene Hal, NP       liothyronine (CYTOMEL) tablet 10 mcg  10 mcg Oral Daily Massengill, Nathan, MD   10 mcg at 06/27/21 7035   magnesium hydroxide (MILK OF MAGNESIA) suspension 30 mL  30 mL Oral Daily PRN Clapacs, John T, MD   30 mL at 06/23/21 0618   OLANZapine (ZYPREXA) tablet 5 mg  5 mg Oral QHS Wilbert Hayashi I, NP   5 mg at 06/26/21 2125   simvastatin (ZOCOR) tablet 20 mg  20 mg Oral q1800 Ethelene Hal, NP   20 mg at 06/26/21 2125   traZODone (DESYREL) tablet 100 mg  100 mg Oral QHS PRN Clapacs, Madie Reno, MD   100 mg at 06/26/21 2125   Lab Results:  No results found for this or any previous visit (from the past 48 hour(s)).  Blood  Alcohol level:  Lab Results  Component Value Date   ETH <10 00/93/8182   Metabolic Disorder Labs: Lab Results  Component Value Date   HGBA1C 5.5 06/19/2021   MPG 111.15 06/19/2021   No results found for: PROLACTIN Lab Results  Component Value Date   CHOL 184 06/19/2021   TRIG 76 06/19/2021   HDL 62 06/19/2021   CHOLHDL 3.0 06/19/2021   VLDL 15 06/19/2021   LDLCALC 107 (H) 06/19/2021   LDLCALC 129 (H) 08/23/2017   Physical Findings: AIMS: Facial and Oral Movements  Muscles of Facial Expression: None, normal Lips and Perioral Area: None, normal Jaw: None, normal Tongue: None, normal,Extremity Movements Upper (arms, wrists, hands, fingers): None, normal Lower (legs, knees, ankles, toes): None, normal, Trunk Movements Neck, shoulders, hips: None, normal, Overall Severity Severity of abnormal movements (highest score from questions above): None, normal Incapacitation due to abnormal movements: None, normal Patient's awareness of abnormal movements (rate only patient's report): No Awareness, Dental Status Current problems with teeth and/or dentures?: No Does patient usually wear dentures?: No   Musculoskeletal: Strength & Muscle Tone: within normal limits Gait & Station: normal Patient leans: N/A  Psychiatric Specialty Exam:  Presentation  General Appearance: Appropriate for Environment; Casual; Fairly Groomed  Eye Contact:Good  Speech:Normal Rate; Clear and Coherent  Speech Volume:Normal  Handedness:Right  Mood and Affect  Mood:Anxious; Depressed  Affect:Congruent; Constricted; Depressed , improving since admisison  Thought Process  Thought Processes:Linear; Coherent; Goal Directed  Descriptions of Associations:Intact  Orientation:Full (Time, Place and Person)  Thought Content:Logical  History of Schizophrenia/Schizoaffective disorder:No Duration of Psychotic Symptoms: N/A Hallucinations:Denies  Ideas of Reference:None  Suicidal Thoughts:  denies  Homicidal Thoughts: denies   Sensorium  Memory:Immediate Good; Recent Good; Remote Good  Judgment:Fair  Insight:Fair  Executive Functions  Concentration:Good  Attention Span:Good  [place]  Language:Good  Psychomotor Activity  Psychomotor Activity:Normal  Assets  Assets:Communication Skills; Desire for Improvement; Housing; Resilience; Social Support; Physical Health; Vocational/Educational  Sleep  Sleep: Fair 6.75 hours  Physical Exam: Physical Exam Vitals and nursing note reviewed.  Constitutional:      General: He is not in acute distress.    Appearance: Normal appearance. He is not ill-appearing or toxic-appearing.  HENT:     Head: Normocephalic.  Pulmonary:     Effort: Pulmonary effort is normal.  Musculoskeletal:        General: Normal range of motion.     Cervical back: Normal range of motion.  Neurological:     General: No focal deficit present.     Mental Status: He is alert and oriented to person, place, and time.     Motor: No weakness.     Gait: Gait normal.  Psychiatric:        Attention and Perception: Attention and perception normal. He does not perceive auditory or visual hallucinations.        Mood and Affect: Mood is anxious and depressed.        Speech: Speech normal.        Behavior: Behavior normal. Behavior is cooperative.        Thought Content: Thought content normal. Thought content is not paranoid or delusional. Thought content does not include homicidal or suicidal ideation. Thought content does not include homicidal or suicidal plan.        Cognition and Memory: Cognition normal.   Review of Systems  Constitutional: Negative.  Negative for fever.  HENT: Negative.  Negative for congestion, sinus pain and sore throat.   Respiratory: Negative.  Negative for cough and shortness of breath.   Cardiovascular: Negative.  Negative for chest pain.  Gastrointestinal: Negative.   Genitourinary: Negative.    Musculoskeletal: Negative.   Neurological: Negative.   Psychiatric/Behavioral:  Positive for depression. The patient is nervous/anxious.   All other systems reviewed and are negative.  Blood pressure 138/89, pulse 77, temperature 97.7 F (36.5 C), temperature source Oral, resp. rate 20, height 5\' 8"  (1.727 m), weight 83.9 kg, SpO2 98 %. Body mass index is 28.13 kg/m.  Treatment Plan  Summary: Daily contact with patient to assess and evaluate symptoms and progress in treatment and Medication management  Assessment: -Diagnosis is likely major depressive disorder, severe, recurrent, without psychotic features.  However we are also considering the diagnosis of bipolar disorder, type II, current depressive episode.  We are considering bipolar disorder, due to vague possible hypomanic symptoms in the past, most recently occurring about 3 years ago.  Additionally there is a family history of bipolar disorder and failure multiple first-line antidepressant medication trials.  -GAD -h/o prostate cancer   Plan: -Continue cytomel 10 mcg once daily for augmentation of depression. Denies h/o cardiac disease. TSH wnl.  -Continue Prozac 60 mg PO daily starting 10/22 -Continue Olanzapine 5 mg po QH to augment antidepressant. -Continue Vistaril 25 mg PO TID PRN for break-through anxiety. -Continue Clonazepam 0.5 mg po to Q 12 hours for anxiety. -Continue Trazodone 100 mg PO PRN for insomnia.   Labs reviewed 10/15 CBC with RBC 5.84, Hgb 17.3, HCT 53.1, TSH 1.948.   Treat medical problems as indicated (resumed meds). Amlodipine 10 mg-benazepril 20 mg PO daily for HTN. Breo Ellipta 200-25 mcg 1 puff daily for COPD. Simvastatin 20 mg PO every evening for high cholesterol.   Other prn medications: continue, Acetaminophen 650 mg PO every 6 hrs PRN for pain/fever. Mylanta 30 ml PO every 4 hrs PRN for indigestion.  MOM 30 ml PO  daily PRN for constipation.  Develop a treatment plan to decrease risk of  relapse upon discharge and the need for readmission.  Suggest Health care follow up as needed for medical problems.  Call for consults with hospitalist for any additional specialty patient care services as needed  Continue every 15 minute safety checks Encourage participation in the therapeutic milieu Discharge planning in progress  Lindell Spar, NP, pmhnp, fnp-bc 06/27/2021, 3:13 PM Patient ID: Dennie Maizes, male   DOB: April 04, 1961, 60 y.o.   MRN: [id number] Patient ID: Winford Hehn, male   DOB: August 02, 1961, 60 y.o.   MRN: [id number] Patient ID: Oziah Vitanza, male   DOB: November 16, 1960, 60 y.o.   MRN: [id number]

## 2021-06-27 NOTE — [facility] Group Notes (Signed)
.  Psychoeducational Group Note    Date:06/27/2021 Time: 1300-1400    Purpose of Group: . The group focus' on teaching patients on how to identify their needs and how Life Skills:  A group where two lists are made. What people need and what are things that we do that are healthy. The lists are developed by the patients and it is explained that we often do the actions that are not healthy to get our list of needs met.  to develop the coping skills needed to get their needs met  Participation Level:  Active  Participation Quality:  Appropriate  Affect:  Appropriate  Cognitive:  Oriented  Insight:  Improving  Engagement in Group:  Engaged  Additional Comments:  Pt rates his energy at a 9/10  Bryson Dames A

## 2021-06-27 NOTE — Group Note (Signed)
Group Topic: Goal Setting  Group Date: 06/27/2021 Start Time: 0900 End Time: 0930 Facilitators: Kathreen Cornfield  Department: [place] [facility]  Number of Participants: 9  Group Focus:  Treatment Modality:  Skills Training Interventions utilized were orientation Purpose: Orientation of the unit and awareness of rules and regulations, also daily schedule and visitation hours. Helping patients become familiar with who Patients Doctors, Nurses, and Techs are.  Name: Gerald Boyer Date of Birth: 1960-11-23  MR: [id number]    Level of Participation: moderate Quality of Participation: engaged Interactions with others: appropriate Mood/Affect: appropriate Triggers (if applicable):  Cognition: n/a Progress: Gaining insight Response:  Plan: patient will be encouraged to   Patients Problems:  Patient Active Problem List   Diagnosis Date Noted   GAD (generalized anxiety disorder) 06/19/2021   Severe recurrent major depression without psychotic features ([place]) 06/18/2021   Prostate cancer ([place]) 12/30/2017   Diverticulitis of large intestine with perforation without bleeding 11/28/2017   Obesity 08/23/2017   Elevated prostate specific antigen (PSA) 08/04/2015   Essential (primary) hypertension 08/04/2015   Ruptured tympanic membrane 08/04/2015   Extrapyramidal disease 08/04/2015   Hypertriglyceridemia 06/27/2014   History of colonic polyps 02/21/2012   Panic disorder (episodic paroxysmal anxiety) 09/06/2001   Allergic rhinitis due to pollen 09/06/1998

## 2021-06-27 NOTE — Group Note (Signed)
LCSW Group Therapy Note  No therapy group could be held this day due to staffing issues.  Another licensed group was held.  Selmer Dominion, LCSW 06/27/2021 11:39 AM

## 2021-06-27 NOTE — Progress Notes (Signed)
Adult Psychoeducational Group Note  Date:  06/27/2021 Time:  9:13 PM  Group Topic/Focus:  Wrap-Up Group:   The focus of this group is to help patients review their daily goal of treatment and discuss progress on daily workbooks.  Participation Level:  Active  Participation Quality:  Appropriate  Affect:  Appropriate  Cognitive:  Appropriate  Insight: Appropriate  Engagement in Group:  Improving  Modes of Intervention:  Discussion  Additional Comments:   Pt stated his goal for today was to focus on his treatment plan. Pt stated he accomplished his goal today. Pt stated he talked with his doctor but did not get a chance to talk with his social worker about his care today. Pt rated his overall day a 8 out of 10. Pt stated he was able to contact his son today and his son coming for visitation tonight improved his overall day. Pt stated he felt better about himself today. Pt stated he was able to attend all meals. Pt stated he took all medications provided today. Pt stated he attend all groups held today. Pt stated his appetite was pretty good today. Pt rated sleep last night was pretty good. Pt stated the goal tonight was to get some rest. Pt stated he had no physical pain today. Pt deny visual hallucinations and auditory issues tonight. Pt denies thoughts of harming himself or others. Pt stated he would alert staff if anything changed.  Candy Sledge 06/27/2021, 9:13 PM

## 2021-06-27 NOTE — [facility] Group Notes (Signed)
.  Psychoeducational Group Note  Date: 06/27/2021 Time: 0900-1000    Goal Setting   Purpose of Group: This group helps to provide patients with the steps of setting a goal that is specific, measurable, attainable, realistic and time specific. A discussion on how we keep ourselves stuck with negative self talk.    Participation Level:  Active  Participation Quality:  Appropriate  Affect:  Appropriate  Cognitive:  Appropriate  Insight:  Improving  Engagement in Group:  Engaged  Additional Comments:  Pt rates his energy as a 7/10  Gerald Boyer A

## 2021-06-27 NOTE — B[facility] Counselor (Signed)
Clinical Social Work Note  CSW returned phone call to patient's girlfriend Olga Millers  [phone].  She stated that she called [facility] to check on patient's appointment and was told there is no appointment scheduled on 10/24 at 2:30pm.  CSW informed her that would leave this message for weekday staff to address first thing Monday morning.  She had questions about how the patient is doing, was provided with an update.  She was very grateful.  She will arrive at 11:00am on Monday 10/24 to pick up patient.  Selmer Dominion, LCSW 06/27/2021, 3:21 PM

## 2021-06-27 NOTE — Progress Notes (Signed)
   06/27/21 2015  Psych Admission Type (Psych Patients Only)  Admission Status Voluntary  Psychosocial Assessment  Patient Complaints None  Eye Contact Fair  Facial Expression Anxious  Affect Appropriate to circumstance  Speech Logical/coherent;Soft  Interaction Minimal  Motor Activity Slow  Appearance/Hygiene In scrubs  Behavior Characteristics Appropriate to situation;Cooperative  Mood Depressed;Pleasant  Aggressive Behavior  Effect No apparent injury  Thought Process  Coherency WDL  Content WDL  Delusions None reported or observed  Perception WDL  Hallucination None reported or observed  Judgment Poor  Confusion None  Danger to Self  Current suicidal ideation? Denies  Self-Injurious Behavior No self-injurious ideation or behavior indicators observed or expressed   Agreement Not to Harm Self Yes  Description of Agreement Verbal Contract  Danger to Others  Danger to Others None reported or observed

## 2021-06-27 NOTE — Progress Notes (Signed)
   06/27/21 1200  Psych Admission Type (Psych Patients Only)  Admission Status Voluntary  Psychosocial Assessment  Patient Complaints Depression  Eye Contact Fair  Facial Expression Anxious  Affect Appropriate to circumstance  Speech Logical/coherent;Soft  Interaction Minimal  Motor Activity Slow  Appearance/Hygiene In scrubs  Behavior Characteristics Cooperative  Mood Depressed  Aggressive Behavior  Effect No apparent injury  Thought Process  Coherency WDL  Content WDL  Delusions None reported or observed  Perception WDL  Hallucination None reported or observed  Judgment Poor  Confusion None  Danger to Self  Current suicidal ideation? Denies  Self-Injurious Behavior No self-injurious ideation or behavior indicators observed or expressed   Agreement Not to Harm Self Yes  Description of Agreement Verbal Contract  Danger to Others  Danger to Others None reported or observed

## 2021-06-28 MED ORDER — PHENYLEPHRINE-MINERAL OIL-PET 0.25-14-74.9 % RE OINT
1.0000 "application " | TOPICAL_OINTMENT | Freq: Two times a day (BID) | RECTAL | Status: DC | PRN
  Administered 2021-06-28 – 2021-06-29 (×2): 1 via RECTAL
  Filled 2021-06-28: qty 57

## 2021-06-28 NOTE — Progress Notes (Signed)
D: Patient did not have any physical complaints this morning. He is compliant with her medications. His blood pressure has improved. He rates his depression as 4; his anxiety and hopelessness as a 3. Patient states he will be discharged tomorrow. His goal today is work on his "self confidence and self worth." His goal is to "try to stay positive and repeat my positive qualities to myself." He is sleeping and eating well. He denies any thoughts of self harm. He does not appear to be responding to internal stimuli. He is ambulating well and is present in the milieu. Patient appears to be vested in his treatment.  A: Continue to monitor medication management and MD orders.  Safety checks completed every 15 minutes per protocol.  Offer support and encouragement as needed.  R: Patient is receptive to staff; his behavior is appropriate.     06/28/21 0900  Psych Admission Type (Psych Patients Only)  Admission Status Voluntary  Psychosocial Assessment  Patient Complaints None  Eye Contact Fair  Facial Expression Anxious  Affect Appropriate to circumstance  Speech Logical/coherent;Soft  Interaction Forwards little  Motor Activity Other (Comment) (wnl)  Appearance/Hygiene Unremarkable  Behavior Characteristics Appropriate to situation  Mood Depressed;Pleasant  Thought Process  Coherency WDL  Content WDL  Delusions None reported or observed  Perception WDL  Hallucination None reported or observed  Judgment Poor  Confusion None  Danger to Self  Current suicidal ideation? Denies  Description of Agreement verbal  Danger to Others  Danger to Others None reported or observed

## 2021-06-28 NOTE — [facility] Group Notes (Signed)
Adult Psychoeducational Group Not Date:  06/28/2021 Time:  0900-1045 Group Topic/Focus: PROGRESSIVE RELAXATION. A group where deep breathing is taught and tensing and relaxation muscle groups is used. Imagery is used as well.  Pts are asked to imagine 3 pillars that hold them up when they are not able to hold themselves up.  Participation Level:  Active  Participation Quality:  Appropriate  Affect:  Appropriate  Cognitive:  Oriented  Insight: Improving  Engagement in Group:  Engaged  Modes of Intervention:  Activity, Discussion, Education, and Support  Additional Comments:  Pt rates his energy at a 7/10. States family faith and reading hold him up.  Paulino Rily

## 2021-06-28 NOTE — Progress Notes (Signed)
Adult Psychoeducational Group Note  Date:  06/28/2021 Time:  9:02 PM  Group Topic/Focus:  Wrap-Up Group:   The focus of this group is to help patients review their daily goal of treatment and discuss progress on daily workbooks.  Participation Level:  Active  Participation Quality:  Appropriate  Affect:  Appropriate  Cognitive:  Appropriate  Insight: Appropriate  Engagement in Group:  Engaged  Modes of Intervention:  Exploration  Additional Comments:  patient attended and participated in group tonight. He reports that the best thing that happen to him today was going to group, tomorrom his goal is to go home  Gerald Boyer 06/28/2021, 9:02 PM

## 2021-06-28 NOTE — Progress Notes (Signed)
[facility] MD Progress Note  06/28/2021 3:16 PM Gerald Boyer  MRN:  [id number]  Subjective:  Patient is a 60 year old Caucasian male with hx of major depressive disorder & medical diagnosis that includes; s/p prostate cancer, HTN, Hyperlipidemia & COPD. Admitted to the [facility] from the [facility] with complaint of worsening symptoms of depression, anxiety/panic symptoms, poor concentration, excessive worrying, forgetfulness, decreased sleep & hopelessness & suicidal ideations. Patient apparently has battled depression most of his life & was most recently had received mental health treatment under the care of Dr. Nicolasa Ducking.  Evaluation on the unit today, 10/23: Patient was seen, chart reviewed and case discussed with the treatment team.  Daily notes 06/28/21: Gerald Boyer reports today that he is doing well. He says he he is looking forward to getting discharged tomorrow morning.  He says he has appointment at the [facility] in [place] tomorrow afternoon at 2:30 or 2:45 pm. He says he is sleeping well at night. Reports good appetite. Taking & tolerating his treatment regimen. He denies any SIHI, AVH, delusional thoughts or paranoia. He does not appear to be responding to any internal stimuli. There no changes made on his current plan of care. He remains active in the group milieu.    Principal Problem: Severe recurrent major depression without psychotic features ([place]) Diagnosis: Principal Problem:   Severe recurrent major depression without psychotic features ([place]) Active Problems:   Panic disorder (episodic paroxysmal anxiety)   GAD (generalized anxiety disorder)  Total Time spent with patient: 15 minutes  Past Psychiatric History:  Major depressive disorder  Past Medical History:  Past Medical History:  Diagnosis Date   Allergy    Anxiety    Cancer ([place])    Diverticulitis    Hyperlipidemia    Hypertension    Prostate cancer [facility])     Past Surgical History:  Procedure Laterality Date   LIPOMA EXCISION      located on  left shoulder   LYMPHADENECTOMY Bilateral 12/30/2017   Procedure: LYMPHADENECTOMY;  Surgeon: Alexis Frock, MD;  Location: [facility];  Service: Urology;  Laterality: Bilateral;   PROSTATE BIOPSY  01/2011   outpatient, Dr. Eliberto Ivory; 05/2011- mild chronic inflammation, no dysplasia   ROBOT ASSISTED LAPAROSCOPIC RADICAL PROSTATECTOMY N/A 12/30/2017   Procedure: XI ROBOTIC ASSISTED LAPAROSCOPIC RADICAL PROSTATECTOMY WITH PELVIC LYMPHADENECTOMY AND INJECTION OF INDOCYANINE GREEN DYE;  Surgeon: Alexis Frock, MD;  Location: [facility];  Service: Urology;  Laterality: N/A;   TONSILLECTOMY     VASECTOMY  1995   Family History:  Family History  Problem Relation Age of Onset   COPD Mother    Kidney cancer Mother    Prostate cancer Father        treated with radiation   Rectal cancer Daughter    Hypertension Other    Breast cancer Neg Hx    Colon cancer Neg Hx    Pancreatic cancer Neg Hx    Family Psychiatric  History: Bipolar disorder: Maternal grandmother  Social History:  Social History   Substance and Sexual Activity  Alcohol Use Yes   Comment: 2-4 beers/day     Social History   Substance and Sexual Activity  Drug Use No   Comment: + benzos and MDMA    Social History   Socioeconomic History   Marital status: Single    Spouse name: IT sales professional   Number of children: 3   Years of education: Not on file   Highest education level: Not on file  Occupational History  Occupation: Engineer, water  Tobacco Use   Smoking status: Never   Smokeless tobacco: Never  Vaping Use   Vaping Use: Never used  Substance and Sexual Activity   Alcohol use: Yes    Comment: 2-4 beers/day   Drug use: No    Comment: + benzos and MDMA   Sexual activity: Not Currently  Other Topics Concern   Not on file  Social History Narrative   Divorced. Has two daughters and one son. Resides in [place] with significant other [name].   Social Determinants of Health   Financial Resource Strain:  Not on file  Food Insecurity: Not on file  Transportation Needs: Not on file  Physical Activity: Not on file  Stress: Not on file  Social Connections: Not on file   Additional Social History:    Divorced, has 3 children, unemployed, lives in [place], [place].    Sleep: Good, 6.75 hrs per documentation.  Appetite:  Good,   Current Medications: Current Facility-Administered Medications  Medication Dose Route Frequency Provider Last Rate Last Admin   acetaminophen (TYLENOL) tablet 650 mg  650 mg Oral Q6H PRN Clapacs, John T, MD       alum & mag hydroxide-simeth (MAALOX/MYLANTA) 200-200-20 MG/5ML suspension 30 mL  30 mL Oral Q4H PRN Clapacs, John T, MD       amLODipine (NORVASC) tablet 10 mg  10 mg Oral Daily Clapacs, John T, MD   10 mg at 06/28/21 0745   And   benazepril (LOTENSIN) tablet 20 mg  20 mg Oral Daily Clapacs, John T, MD   20 mg at 06/28/21 0745   clonazePAM (KLONOPIN) tablet 0.5 mg  0.5 mg Oral Q12H Katieann Hungate I, NP   0.5 mg at 06/28/21 0745   FLUoxetine (PROZAC) capsule 60 mg  60 mg Oral Daily Massengill, Nathan, MD   60 mg at 06/28/21 0745   fluticasone furoate-vilanterol (BREO ELLIPTA) 200-25 MCG/INH 1 puff  1 puff Inhalation Daily Clapacs, John T, MD       hydrOXYzine (ATARAX/VISTARIL) tablet 25 mg  25 mg Oral TID PRN Ethelene Hal, NP       liothyronine (CYTOMEL) tablet 10 mcg  10 mcg Oral Daily Massengill, Nathan, MD   10 mcg at 06/28/21 0745   magnesium hydroxide (MILK OF MAGNESIA) suspension 30 mL  30 mL Oral Daily PRN Clapacs, John T, MD   30 mL at 06/23/21 0618   OLANZapine (ZYPREXA) tablet 5 mg  5 mg Oral QHS Leilana Mcquire I, NP   5 mg at 06/27/21 2135   simvastatin (ZOCOR) tablet 20 mg  20 mg Oral q1800 Ethelene Hal, NP   20 mg at 06/27/21 2005   traZODone (DESYREL) tablet 100 mg  100 mg Oral QHS PRN Clapacs, Madie Reno, MD   100 mg at 06/27/21 2136   Lab Results:  No results found for this or any previous visit (from the past 48 hour(s)).  Blood  Alcohol level:  Lab Results  Component Value Date   ETH <10 29/92/4268   Metabolic Disorder Labs: Lab Results  Component Value Date   HGBA1C 5.5 06/19/2021   MPG 111.15 06/19/2021   No results found for: PROLACTIN Lab Results  Component Value Date   CHOL 184 06/19/2021   TRIG 76 06/19/2021   HDL 62 06/19/2021   CHOLHDL 3.0 06/19/2021   VLDL 15 06/19/2021   LDLCALC 107 (H) 06/19/2021   LDLCALC 129 (H) 08/23/2017   Physical Findings: AIMS: Facial and Oral Movements  Muscles of Facial Expression: None, normal Lips and Perioral Area: None, normal Jaw: None, normal Tongue: None, normal,Extremity Movements Upper (arms, wrists, hands, fingers): None, normal Lower (legs, knees, ankles, toes): None, normal, Trunk Movements Neck, shoulders, hips: None, normal, Overall Severity Severity of abnormal movements (highest score from questions above): None, normal Incapacitation due to abnormal movements: None, normal Patient's awareness of abnormal movements (rate only patient's report): No Awareness, Dental Status Current problems with teeth and/or dentures?: No Does patient usually wear dentures?: No   Musculoskeletal: Strength & Muscle Tone: within normal limits Gait & Station: normal Patient leans: N/A  Psychiatric Specialty Exam:  Presentation  General Appearance: Appropriate for Environment; Casual; Fairly Groomed  Eye Contact:Good  Speech:Normal Rate; Clear and Coherent  Speech Volume:Normal  Handedness:Right  Mood and Affect  Mood:Anxious; Depressed  Affect:Congruent; Constricted; Depressed , improving since admisison  Thought Process  Thought Processes:Linear; Coherent; Goal Directed  Descriptions of Associations:Intact  Orientation:Full (Time, Place and Person)  Thought Content:Logical  History of Schizophrenia/Schizoaffective disorder:No Duration of Psychotic Symptoms: N/A Hallucinations:Denies  Ideas of Reference:None  Suicidal Thoughts:  denies  Homicidal Thoughts: denies   Sensorium  Memory:Immediate Good; Recent Good; Remote Good  Judgment:Fair  Insight:Fair  Executive Functions  Concentration:Good  Attention Span:Good  [place]  Language:Good  Psychomotor Activity  Psychomotor Activity:Normal  Assets  Assets:Communication Skills; Desire for Improvement; Housing; Resilience; Social Support; Physical Health; Vocational/Educational  Sleep  Sleep: Fair 6.75 hours  Physical Exam: Physical Exam Vitals and nursing note reviewed.  Constitutional:      General: He is not in acute distress.    Appearance: Normal appearance. He is not ill-appearing or toxic-appearing.  HENT:     Head: Normocephalic.  Pulmonary:     Effort: Pulmonary effort is normal.  Musculoskeletal:        General: Normal range of motion.     Cervical back: Normal range of motion.  Neurological:     General: No focal deficit present.     Mental Status: He is alert and oriented to person, place, and time.     Motor: No weakness.     Gait: Gait normal.  Psychiatric:        Attention and Perception: Attention and perception normal. He does not perceive auditory or visual hallucinations.        Mood and Affect: Mood is anxious and depressed.        Speech: Speech normal.        Behavior: Behavior normal. Behavior is cooperative.        Thought Content: Thought content normal. Thought content is not paranoid or delusional. Thought content does not include homicidal or suicidal ideation. Thought content does not include homicidal or suicidal plan.        Cognition and Memory: Cognition normal.   Review of Systems  Constitutional: Negative.  Negative for fever.  HENT: Negative.  Negative for congestion, sinus pain and sore throat.   Respiratory: Negative.  Negative for cough and shortness of breath.   Cardiovascular: Negative.  Negative for chest pain.  Gastrointestinal: Negative.   Genitourinary: Negative.    Musculoskeletal: Negative.   Neurological: Negative.   Psychiatric/Behavioral:  Positive for depression. The patient is nervous/anxious.   All other systems reviewed and are negative.  Blood pressure 118/86, pulse 75, temperature (!) 97.4 F (36.3 C), temperature source Oral, resp. rate 20, height 5\' 8"  (1.727 m), weight 83.9 kg, SpO2 96 %. Body mass index is 28.13 kg/m.  Treatment  Plan Summary: Daily contact with patient to assess and evaluate symptoms and progress in treatment and Medication management.   Continue inpatient hospitalization.  Will continue today 06/28/2021 plan as below except where it is noted.   Assessment: -Diagnosis is likely major depressive disorder, severe, recurrent, without psychotic features.  However we are also considering the diagnosis of bipolar disorder, type II, current depressive episode.  We are considering bipolar disorder, due to vague possible hypomanic symptoms in the past, most recently occurring about 3 years ago.  Additionally there is a family history of bipolar disorder and failure multiple first-line antidepressant medication trials.  -GAD -h/o prostate cancer   Plan: -Continue cytomel 10 mcg once daily for augmentation of depression. Denies h/o cardiac disease. TSH wnl.  -Continue Prozac 60 mg PO daily starting 10/22 -Continue Olanzapine 5 mg po QH to augment antidepressant. -Continue Vistaril 25 mg PO TID PRN for break-through anxiety. -Continue Clonazepam 0.5 mg po to Q 12 hours for anxiety. -Continue Trazodone 100 mg PO PRN for insomnia.   Labs reviewed 10/15 CBC with RBC 5.84, Hgb 17.3, HCT 53.1, TSH 1.948.   Treat medical problems as indicated (resumed meds). Amlodipine 10 mg-benazepril 20 mg PO daily for HTN. Breo Ellipta 200-25 mcg 1 puff daily for COPD. Simvastatin 20 mg PO every evening for high cholesterol.   Other prn medications: continue, Acetaminophen 650 mg PO every 6 hrs PRN for pain/fever. Mylanta 30 ml PO every  4 hrs PRN for indigestion.  MOM 30 ml PO  daily PRN for constipation.  Continue every 15 minute safety checks Encourage participation in the therapeutic milieu Discharge planning disposition plan in progress.  Lindell Spar, NP, pmhnp, fnp-bc 06/28/2021, 3:16 PM Patient ID: Gerald Boyer, male   DOB: 1960/12/01, 60 y.o.   MRN: [id number]

## 2021-06-28 NOTE — BHH Group Notes (Signed)
[facility] Group Notes:  (Nursing/MHT/Case Management/Adjunct)  Date:  06/28/2021  Time:  0900  Type of Therapy:  Group Therapy  Participation Level:  Active  Participation Quality:  Appropriate  Affect:  Appropriate  Cognitive:  Appropriate  Insight:  Appropriate  Engagement in Group:  Engaged  Modes of Intervention:  Discussion  Summary of Progress/Problems:Patient"s goal today was to work on his discharge plans. Patient wants to be sure he has all information needed about medication and therapy. Patient concerned about being able to function on his own as evidenced by Client stating.  "I  hope I will do okay once Im out. Ive done well since being here."  Gerald Boyer 06/28/2021, 2:10 PM

## 2021-06-29 ENCOUNTER — Encounter (HOSPITAL_COMMUNITY): Payer: Self-pay

## 2021-06-29 MED ORDER — FLUOXETINE HCL 20 MG PO CAPS: 60.0000 mg | ORAL_CAPSULE | Freq: Every day | ORAL | 0 refills | Status: AC

## 2021-06-29 MED ORDER — OLANZAPINE 5 MG PO TABS: 5.0000 mg | ORAL_TABLET | Freq: Every day | ORAL | 0 refills | Status: AC

## 2021-06-29 MED ORDER — TRAZODONE HCL 100 MG PO TABS: 100.0000 mg | ORAL_TABLET | Freq: Every evening | ORAL | 0 refills | Status: AC | PRN

## 2021-06-29 MED ORDER — LIOTHYRONINE SODIUM 5 MCG PO TABS: 10.0000 ug | ORAL_TABLET | Freq: Every day | ORAL | 0 refills | Status: AC

## 2021-06-29 MED ORDER — SIMVASTATIN 20 MG PO TABS: 20.0000 mg | ORAL_TABLET | Freq: Every day | ORAL | 0 refills | Status: AC

## 2021-06-29 MED ORDER — PHENYLEPHRINE-MINERAL OIL-PET 0.25-14-74.9 % RE OINT: 1.0000 "application " | TOPICAL_OINTMENT | Freq: Two times a day (BID) | RECTAL | 0 refills | Status: AC | PRN

## 2021-06-29 MED ORDER — BENAZEPRIL HCL 20 MG PO TABS: 20.0000 mg | ORAL_TABLET | Freq: Every day | ORAL | 0 refills | Status: AC

## 2021-06-29 MED ORDER — CLONAZEPAM 0.5 MG PO TABS: 0.5000 mg | ORAL_TABLET | Freq: Two times a day (BID) | ORAL | 0 refills | Status: AC

## 2021-06-29 MED ORDER — AMLODIPINE BESYLATE 10 MG PO TABS: 10.0000 mg | ORAL_TABLET | Freq: Every day | ORAL | 0 refills | Status: AC

## 2021-06-29 NOTE — BH IP Treatment Plan (Signed)
Interdisciplinary Treatment and Diagnostic Plan Update  06/29/2021 Time of Session: 9:20am Gerald Boyer MRN: [id number]  Principal Diagnosis: Severe recurrent major depression without psychotic features [facility])  Secondary Diagnoses: Principal Problem:   Severe recurrent major depression without psychotic features ([place]) Active Problems:   Panic disorder (episodic paroxysmal anxiety)   GAD (generalized anxiety disorder)   Current Medications:  Current Facility-Administered Medications  Medication Dose Route Frequency Provider Last Rate Last Admin   acetaminophen (TYLENOL) tablet 650 mg  650 mg Oral Q6H PRN Clapacs, Madie Reno, MD       alum & mag hydroxide-simeth (MAALOX/MYLANTA) 200-200-20 MG/5ML suspension 30 mL  30 mL Oral Q4H PRN Clapacs, Madie Reno, MD       amLODipine (NORVASC) tablet 10 mg  10 mg Oral Daily Clapacs, Madie Reno, MD   10 mg at 06/29/21 2353   And   benazepril (LOTENSIN) tablet 20 mg  20 mg Oral Daily Clapacs, John T, MD   20 mg at 06/29/21 0736   clonazePAM (KLONOPIN) tablet 0.5 mg  0.5 mg Oral Q12H Nwoko, Agnes I, NP   0.5 mg at 06/29/21 0735   FLUoxetine (PROZAC) capsule 60 mg  60 mg Oral Daily Massengill, Ovid Curd, MD   60 mg at 06/29/21 0735   fluticasone furoate-vilanterol (BREO ELLIPTA) 200-25 MCG/INH 1 puff  1 puff Inhalation Daily Clapacs, John T, MD       hydrOXYzine (ATARAX/VISTARIL) tablet 25 mg  25 mg Oral TID PRN Ethelene Hal, NP       liothyronine (CYTOMEL) tablet 10 mcg  10 mcg Oral Daily Massengill, Ovid Curd, MD   10 mcg at 06/29/21 0735   magnesium hydroxide (MILK OF MAGNESIA) suspension 30 mL  30 mL Oral Daily PRN Clapacs, John T, MD   30 mL at 06/23/21 0618   OLANZapine (ZYPREXA) tablet 5 mg  5 mg Oral QHS Nwoko, Agnes I, NP   5 mg at 06/28/21 2100   phenylephrine-shark liver oil-mineral oil-petrolatum (PREPARATION H) rectal ointment 1 application  1 application Rectal BID PRN Lindell Spar I, NP   1 application at 61/44/31 0738   simvastatin (ZOCOR)  tablet 20 mg  20 mg Oral q1800 Ethelene Hal, NP   20 mg at 06/28/21 2011   traZODone (DESYREL) tablet 100 mg  100 mg Oral QHS PRN Clapacs, Madie Reno, MD   100 mg at 06/28/21 2100   PTA Medications: Medications Prior to Admission  Medication Sig Dispense Refill Last Dose   albuterol (PROVENTIL HFA;VENTOLIN HFA) 108 (90 Base) MCG/ACT inhaler Inhale 2 puffs into the lungs every 6 (six) hours as needed for wheezing or shortness of breath. (Patient not taking: Reported on 01/24/2019) 1 Inhaler 2    amLODipine-benazepril (LOTREL) 10-20 MG capsule TAKE 1 CAPSULE BY MOUTH EVERY DAY IN THE MORNING      bicalutamide (CASODEX) 50 MG tablet  (Patient not taking: No sig reported)      BREO ELLIPTA 200-25 MCG/INH AEPB TAKE 1 PUFF BY MOUTH EVERY DAY**NEEDS OFFICE VISIT** (Patient not taking: No sig reported) 60 each 4    clonazePAM (KLONOPIN) 0.5 MG tablet TAKE 1 TABLET BY MOUTH THREE TIMES A DAY AS NEEDED FOR ANXIETY (Patient not taking: No sig reported) 60 tablet 1    fenofibrate (TRICOR) 145 MG tablet Take 145 mg by mouth daily.      fluticasone (FLONASE) 50 MCG/ACT nasal spray Place 2 sprays into both nostrils daily as needed for allergies or rhinitis.      Loratadine  10 MG CAPS Take 1 tablet by mouth daily as needed (allergies).       sertraline (ZOLOFT) 100 MG tablet Take 100 mg by mouth every morning. (Patient not taking: No sig reported)      simvastatin (ZOCOR) 20 MG tablet Take 20 mg by mouth daily. (Patient not taking: No sig reported)      venlafaxine XR (EFFEXOR-XR) 150 MG 24 hr capsule TAKE 1 CAPSULE BY MOUTH EVERY DAY (Patient not taking: No sig reported) 90 capsule 4     Patient Stressors: Financial difficulties   Health problems   Medication change or noncompliance   Occupational concerns    Patient Strengths: Ability for insight  Average or above average intelligence  Capable of independent living  Motivation for treatment/growth  Supportive family/friends  Work skills    Treatment Modalities: Medication Management, Group therapy, Case management,  1 to 1 session with clinician, Psychoeducation, Recreational therapy.   Physician Treatment Plan for Primary Diagnosis: Severe recurrent major depression without psychotic features ([place]) Long Term Goal(s): Improvement in symptoms so as ready for discharge   Short Term Goals: Ability to identify changes in lifestyle to reduce recurrence of condition will improve Ability to verbalize feelings will improve Ability to disclose and discuss suicidal ideas Ability to demonstrate self-control will improve Ability to identify and develop effective coping behaviors will improve Ability to maintain clinical measurements within normal limits will improve Compliance with prescribed medications will improve Ability to identify triggers associated with substance abuse/mental health issues will improve  Medication Management: Evaluate patient's response, side effects, and tolerance of medication regimen.  Therapeutic Interventions: 1 to 1 sessions, Unit Group sessions and Medication administration.  Evaluation of Outcomes: Adequate for Discharge  Physician Treatment Plan for Secondary Diagnosis: Principal Problem:   Severe recurrent major depression without psychotic features ([place]) Active Problems:   Panic disorder (episodic paroxysmal anxiety)   GAD (generalized anxiety disorder)  Long Term Goal(s): Improvement in symptoms so as ready for discharge   Short Term Goals: Ability to identify changes in lifestyle to reduce recurrence of condition will improve Ability to verbalize feelings will improve Ability to disclose and discuss suicidal ideas Ability to demonstrate self-control will improve Ability to identify and develop effective coping behaviors will improve Ability to maintain clinical measurements within normal limits will improve Compliance with prescribed medications will improve Ability to identify  triggers associated with substance abuse/mental health issues will improve     Medication Management: Evaluate patient's response, side effects, and tolerance of medication regimen.  Therapeutic Interventions: 1 to 1 sessions, Unit Group sessions and Medication administration.  Evaluation of Outcomes: Adequate for Discharge   RN Treatment Plan for Primary Diagnosis: Severe recurrent major depression without psychotic features ([place]) Long Term Goal(s): Knowledge of disease and therapeutic regimen to maintain health will improve  Short Term Goals: Ability to remain free from injury will improve, Ability to verbalize frustration and anger appropriately will improve, Ability to demonstrate self-control, Ability to participate in decision making will improve, and Compliance with prescribed medications will improve  Medication Management: RN will administer medications as ordered by provider, will assess and evaluate patient's response and provide education to patient for prescribed medication. RN will report any adverse and/or side effects to prescribing provider.  Therapeutic Interventions: 1 on 1 counseling sessions, Psychoeducation, Medication administration, Evaluate responses to treatment, Monitor vital signs and CBGs as ordered, Perform/monitor CIWA, COWS, AIMS and Fall Risk screenings as ordered, Perform wound care treatments as ordered.  Evaluation of Outcomes:  Adequate for Discharge   LCSW Treatment Plan for Primary Diagnosis: Severe recurrent major depression without psychotic features ([place]) Long Term Goal(s): Safe transition to appropriate next level of care at discharge, Engage patient in therapeutic group addressing interpersonal concerns.  Short Term Goals: Engage patient in aftercare planning with referrals and resources, Increase social support, Increase ability to appropriately verbalize feelings, Identify triggers associated with mental health/substance abuse issues, and Increase  skills for wellness and recovery  Therapeutic Interventions: Assess for all discharge needs, 1 to 1 time with Social worker, Explore available resources and support systems, Assess for adequacy in community support network, Educate family and significant other(s) on suicide prevention, Complete Psychosocial Assessment, Interpersonal group therapy.  Evaluation of Outcomes: Adequate for Discharge   Progress in Treatment: Attending groups: Yes. Participating in groups: Yes. Taking medication as prescribed: Yes. Toleration medication: Yes. Family/Significant other contact made: Yes, individual(s) contacted:  Girlfriend  Patient understands diagnosis: Yes. Discussing patient identified problems/goals with staff: Yes. Medical problems stabilized or resolved: Yes. Denies suicidal/homicidal ideation: Yes. Issues/concerns per patient self-inventory: No.     New problem(s) identified: No, Describe:  None    New Short Term/Long Term Goal(s): medication stabilization, elimination of SI thoughts, development of comprehensive mental wellness plan.    Patient Goals:  "To get mentally healthy"   Discharge Plan or Barriers: Patient is to return to stay with girlfriend and is to follow up with [facility]   Reason for Continuation of Hospitalization: Anxiety Medication stabilization   Estimated Length of Stay: Adequate for discharge  Scribe for Treatment Team: Vassie Moselle, LCSW 06/29/2021 10:58 AM

## 2021-06-29 NOTE — Progress Notes (Signed)
Pt discharged to lobby. Pt was stable and appreciative at that time. All papers, samples and prescriptions were given and valuables returned. Verbal understanding expressed. Denies SI/HI and A/VH. Pt given opportunity to express concerns and ask questions.  

## 2021-06-29 NOTE — Progress Notes (Signed)
Patient compliant with treatment. Stated he is ready to get discharged and continue to work on himself when he is discharged. Denies SI/HI/A/VH and verbally contracted for safety. Prn trazodone 100 mg PO administered per request at 2100 for sleep. Q 15 minutes safety checks ongoing without self harm gestures.

## 2021-06-29 NOTE — BHH Counselor (Signed)
CSW contacted [facility] in [place] [phone] and spoke with the receptionist who checked their system and confirmed that the Pt is scheduled today, 06/29/2021 at 2:30pm for an intake appointment.  CSW contacted Ms. Recker to inform her of this information. Ms. Gerald Boyer thanked CSW for this information and states that she will be here at 11:00am to pick the Pt up for discharge.

## 2021-06-29 NOTE — Discharge Summary (Addendum)
Physician Discharge Summary Note  Patient:  Gerald Boyer is an 60 y.o., male MRN:  [id number] DOB:  April 16, 1961 Patient phone:  [phone] (home)  Patient address:   [address] [address],  Total Time spent with patient: 30 minutes  Date of Admission:  06/18/2021 Date of Discharge: 06/29/2021  Reason for Admission:  (From MD's admission note):  60 year old male with a past psychiatric history of major depressive disorder, generalized anxiety, panic disorder, was admitted to the psychiatric unit for evaluation and treatment of suicidal thoughts with plan, worsening depression, and worsening anxiety.  Patient reports multiyear history of worsening depression since about 2019 or 2020, that followed chemotherapy and Lupron therapy for prostate cancer.  Principal Problem: Severe recurrent major depression without psychotic features [facility]) Discharge Diagnoses: Principal Problem:   Severe recurrent major depression without psychotic features ([place]) Active Problems:   Panic disorder (episodic paroxysmal anxiety)   GAD (generalized anxiety disorder)   Past Psychiatric History: See H&P  Past Medical History:  Past Medical History:  Diagnosis Date   Allergy    Anxiety    Cancer ([place])    Diverticulitis    Hyperlipidemia    Hypertension    Prostate cancer [facility])     Past Surgical History:  Procedure Laterality Date   LIPOMA EXCISION     located on  left shoulder   LYMPHADENECTOMY Bilateral 12/30/2017   Procedure: LYMPHADENECTOMY;  Surgeon: Alexis Frock, MD;  Location: [facility];  Service: Urology;  Laterality: Bilateral;   PROSTATE BIOPSY  01/2011   outpatient, Dr. Eliberto Ivory; 05/2011- mild chronic inflammation, no dysplasia   ROBOT ASSISTED LAPAROSCOPIC RADICAL PROSTATECTOMY N/A 12/30/2017   Procedure: XI ROBOTIC ASSISTED LAPAROSCOPIC RADICAL PROSTATECTOMY WITH PELVIC LYMPHADENECTOMY AND INJECTION OF INDOCYANINE GREEN DYE;  Surgeon: Alexis Frock, MD;  Location:  [facility];  Service: Urology;  Laterality: N/A;   TONSILLECTOMY     VASECTOMY  1995   Family History:  Family History  Problem Relation Age of Onset   COPD Mother    Kidney cancer Mother    Prostate cancer Father        treated with radiation   Rectal cancer Daughter    Hypertension Other    Breast cancer Neg Hx    Colon cancer Neg Hx    Pancreatic cancer Neg Hx    Family Psychiatric  History: See H&P Social History:  Social History   Substance and Sexual Activity  Alcohol Use Yes   Comment: 2-4 beers/day     Social History   Substance and Sexual Activity  Drug Use No   Comment: + benzos and MDMA    Social History   Socioeconomic History   Marital status: Single    Spouse name: IT sales professional   Number of children: 3   Years of education: Not on file   Highest education level: Not on file  Occupational History   Occupation: Inside sales  Tobacco Use   Smoking status: Never   Smokeless tobacco: Never  Vaping Use   Vaping Use: Never used  Substance and Sexual Activity   Alcohol use: Yes    Comment: 2-4 beers/day   Drug use: No    Comment: + benzos and MDMA   Sexual activity: Not Currently  Other Topics Concern   Not on file  Social History Narrative   Divorced. Has two daughters and one son. Resides in [place] with significant other [name].   Social Determinants of Health   Financial Resource Strain: Not on  file  Food Insecurity: Not on file  Transportation Needs: Not on file  Physical Activity: Not on file  Stress: Not on file  Social Connections: Not on file    Hospital Course:  After the above admission evaluation, [name]'s presenting symptoms were noted. He was recommended for mood stabilization treatments. The medication regimen targeting those presenting symptoms were discussed with him & initiated with his consent. His Zoloft and Venlafaxine were discontinued. He was started on Prozac and Zyprexa. His Prozac was titrated during his stay and he is  being discharged on 60 mg daily. His Zyprexa remained at 5 mg at bedtime for his mood. Cytomel was added to augment his antidepressant. His UDS was positive for benzodiazepines (prescribed) and MDMA, which was likely a false positive, BAL was negative.    He was however medicated, stabilized & discharged on the medications as listed on his discharge medication list below. Besides the mood stabilization treatments, [name] was also enrolled & participated in the group counseling sessions being offered & held on this unit. He learned coping skills. He presented no other significant pre-existing medical issues that required treatment. He was restarted Lotensin and Norvasc for blood pressure and Zocor for hyperlipidemia. He tolerated his treatment regimen without any adverse effects or reactions reported.   During the course of his hospitalization, the 15-minute checks were adequate to ensure patient's safety. [name] did not display any dangerous, violent or suicidal behavior on the unit. She interacted with patients & staff appropriately, participated appropriately in the group sessions/therapies. He medications were addressed & adjusted to meet his needs. He was recommended for outpatient follow-up care & medication management upon discharge to assure continuity of care & mood stability.  At the time of discharge patient is not reporting any acute suicidal/homicidal ideations. He feels more confident about his self-care & in managing his mental health. He currently denies any new issues or concerns. Education and supportive counseling provided throughout his hospital stay & upon discharge.   Today upon his discharge evaluation with the attending psychiatrist, [name] shares he is doing well and feels ready to go home. He has an intake appointment today at [facility] at 2:30 PM. He denies any other specific concerns. He is sleeping well. His appetite is good. He denies other physical complaints. He denies  AH/VH, delusional thoughts or paranoia. He does not appear to be responding to any internal stimuli. He feels that his medications have been helpful & is in agreement to continue his current treatment regimen as recommended. He was able to engage in safety planning including plan to return to [facility] or contact emergency services if he feels unable to maintain his own safety or the safety of others. Pt had no further questions, comments, or concerns. He left [facility] with all personal belongings in no apparent distress. Transportation per private vehicle with his girlfriend.    Physical Findings: AIMS: Facial and Oral Movements Muscles of Facial Expression: None, normal Lips and Perioral Area: None, normal Jaw: None, normal Tongue: None, normal,Extremity Movements Upper (arms, wrists, hands, fingers): None, normal Lower (legs, knees, ankles, toes): None, normal, Trunk Movements Neck, shoulders, hips: None, normal, Overall Severity Severity of abnormal movements (highest score from questions above): None, normal Incapacitation due to abnormal movements: None, normal Patient's awareness of abnormal movements (rate only patient's report): No Awareness, Dental Status Current problems with teeth and/or dentures?: No Does patient usually wear dentures?: No  CIWA:    COWS:     Musculoskeletal: Strength & Muscle  Tone: within normal limits Gait & Station: normal Patient leans: N/A  Psychiatric Specialty Exam:  Presentation  General Appearance: Appropriate for Environment; Casual; Fairly Groomed  Eye Contact:Good  Speech:Normal Rate; Clear and Coherent  Speech Volume:Normal  Handedness:Right  Mood and Affect  Mood:Anxious; EUTHYMIC  Affect:Congruent; Constricted; FULL RANGE, SMILING  Thought Process  Thought Processes:Linear; Coherent; Goal Directed  Descriptions of Associations:Intact  Orientation:Full (Time, Place and Person)  Thought Content:Logical  History of  Schizophrenia/Schizoaffective disorder: None Duration of Psychotic Symptoms:None Hallucinations: Denies Ideas of Reference: Denies  Suicidal Thoughts: Denies Homicidal Thoughts: Denies  Sensorium  Memory:Immediate Good; Recent Good; Remote Good  Judgment:Fair  Insight:Fair  Executive Functions  Concentration:Good  Attention Span:Good  [place] of Knowledge:Good  Language:Good  Psychomotor Activity  Psychomotor Activity:No data recorded  Assets  Assets:Communication Skills; Desire for Improvement; Housing; Resilience; Social Support; Physical Health; Vocational/Educational  Sleep  Sleep: 6.75 hours  Physical Exam: Physical Exam Vitals and nursing note reviewed.  Constitutional:      Appearance: Normal appearance.  Pulmonary:     Effort: Pulmonary effort is normal.  Musculoskeletal:        General: Normal range of motion.     Cervical back: Normal range of motion.  Neurological:     General: No focal deficit present.     Mental Status: He is alert and oriented to person, place, and time.  Psychiatric:        Attention and Perception: Attention and perception normal. He does not perceive auditory or visual hallucinations.        Mood and Affect: Mood normal.        Speech: Speech normal.        Behavior: Behavior normal. Behavior is cooperative.        Thought Content: Thought content normal. Thought content is not paranoid or delusional. Thought content does not include homicidal or suicidal ideation. Thought content does not include homicidal or suicidal plan.        Cognition and Memory: Cognition normal.   Review of Systems  Constitutional:  Negative for fever.  HENT:  Negative for congestion and sore throat.   Respiratory:  Negative for cough and shortness of breath.   Cardiovascular:  Negative for chest pain.  Gastrointestinal: Negative.  Negative for constipation.  Genitourinary: Negative.   Musculoskeletal: Negative.   Neurological:  Negative.    Blood pressure 124/84, pulse 79, temperature 97.7 F (36.5 C), temperature source Oral, resp. rate 20, height 5\' 8"  (1.727 m), weight 83.9 kg, SpO2 95 %. Body mass index is 28.13 kg/m.   Social History   Tobacco Use  Smoking Status Never  Smokeless Tobacco Never   Tobacco Cessation:  N/A, patient does not currently use tobacco products   Blood Alcohol level:  Lab Results  Component Value Date   ETH <10 67/20/9470    Metabolic Disorder Labs:  Lab Results  Component Value Date   HGBA1C 5.5 06/19/2021   MPG 111.15 06/19/2021   No results found for: PROLACTIN Lab Results  Component Value Date   CHOL 184 06/19/2021   TRIG 76 06/19/2021   HDL 62 06/19/2021   CHOLHDL 3.0 06/19/2021   VLDL 15 06/19/2021   LDLCALC 107 (H) 06/19/2021   LDLCALC 129 (H) 08/23/2017    See Psychiatric Specialty Exam and Suicide Risk Assessment completed by Attending Physician prior to discharge.  Discharge destination:  Home  Is patient on multiple antipsychotic therapies at discharge:  No   Has Patient had three  or more failed trials of antipsychotic monotherapy by history:  No  Recommended Plan for Multiple Antipsychotic Therapies: NA   Allergies as of 06/29/2021   No Known Allergies      Medication List     STOP taking these medications    albuterol 108 (90 Base) MCG/ACT inhaler Commonly known as: VENTOLIN HFA   amLODipine-benazepril 10-20 MG capsule Commonly known as: LOTREL   bicalutamide 50 MG tablet Commonly known as: CASODEX   fenofibrate 145 MG tablet Commonly known as: TRICOR   sertraline 100 MG tablet Commonly known as: ZOLOFT   venlafaxine XR 150 MG 24 hr capsule Commonly known as: EFFEXOR-XR       TAKE these medications      Indication  amLODipine 10 MG tablet Commonly known as: NORVASC Take 1 tablet (10 mg total) by mouth daily. Start taking on: June 30, 2021  Indication: High Blood Pressure Disorder   benazepril 20 MG  tablet Commonly known as: LOTENSIN Take 1 tablet (20 mg total) by mouth daily. Start taking on: June 30, 2021  Indication: High Blood Pressure Disorder   Breo Ellipta 200-25 MCG/ACT Aepb Generic drug: fluticasone furoate-vilanterol TAKE 1 PUFF BY MOUTH EVERY DAY**NEEDS OFFICE VISIT**  Indication: Asthma   clonazePAM 0.5 MG tablet Commonly known as: KLONOPIN Take 1 tablet (0.5 mg total) by mouth every 12 (twelve) hours. What changed: See the new instructions.  Indication: Feeling Anxious   FLUoxetine 20 MG capsule Commonly known as: PROZAC Take 3 capsules (60 mg total) by mouth daily. Start taking on: June 30, 2021  Indication: Depression, Generalized Anxiety Disorder   fluticasone 50 MCG/ACT nasal spray Commonly known as: FLONASE Place 2 sprays into both nostrils daily as needed for allergies or rhinitis.  Indication: Nonallergic Rhinitis   liothyronine 5 MCG tablet Commonly known as: CYTOMEL Take 2 tablets (10 mcg total) by mouth daily. Start taking on: June 30, 2021  Indication: Depression   Loratadine 10 MG Caps Take 1 tablet by mouth daily as needed (allergies).  Indication: Hayfever   OLANZapine 5 MG tablet Commonly known as: ZYPREXA Take 1 tablet (5 mg total) by mouth at bedtime.  Indication: Major Depressive Disorder, Bipolar disorder   phenylephrine-shark liver oil-mineral oil-petrolatum 0.25-14-74.9 % rectal ointment Commonly known as: PREPARATION H Place 1 application rectally 2 (two) times daily as needed for hemorrhoids.  Indication: hemorrhoids   simvastatin 20 MG tablet Commonly known as: ZOCOR Take 1 tablet (20 mg total) by mouth daily at 6 PM. What changed: when to take this  Indication: High Amount of Fats in the Blood   traZODone 100 MG tablet Commonly known as: DESYREL Take 1 tablet (100 mg total) by mouth at bedtime as needed for sleep.  Indication: [place] on 06/29/2021.   Why: You have a hospital follow up appointment on 06/29/21 at 2:30 pm.  This appointment will be held in person.  Following this initial appointment, you will be scheduled for a clinical assessment to obtain necessary therapy and medication management services. Contact information: [place] [address] [phone]                 Follow-up recommendations:  Activity:  as tolerated Diet:  Heart Healthy  Comments:  Prescriptions were given at discharge.  Patient is agreeable with the discharge plan.  He was given an opportunity to ask questions.  He  appears to feel comfortable with discharge and denies any current suicidal or homicidal thoughts.   Patient is instructed prior to discharge to: Take all medications as prescribed by his mental healthcare provider. Report any adverse effects and or reactions from the medicines to his outpatient provider promptly. Patient has been instructed & cautioned: To not engage in alcohol and or illegal drug use while on prescription medicines. In the event of worsening symptoms, patient is instructed to call the crisis hotline, 911 and or go to the nearest ED for appropriate evaluation and treatment of symptoms. To follow-up with his primary care provider for your other medical issues, concerns and or health care needs.   Signed: Ethelene Hal, NP 06/29/2021, 3:59 PM  Total Time Spent in Direct Patient Care:  I personally spent 45 minutes on the unit in direct patient care. The direct patient care time included face-to-face time with the patient, reviewing the patient's chart, communicating with other professionals, and coordinating care. Greater than 50% of this time was spent in counseling or coordinating care with the patient regarding goals of hospitalization, psycho-education, and discharge planning needs.  On my assessment the patient denied SI, HI, AVH, paranoia, ideas of reference, or first  rank symptoms on day of discharge. Patient denied drug cravings or active signs of withdrawal. Patient denied medication side-effects. Patient was not deemed to be a danger to self or others on day of discharge and was in agreement with discharge plans.   I have independently evaluated the patient during a face-to-face assessment on 06/30/21. I reviewed the patient's chart, and I participated in key portions of the service. I discussed the case with the APP, and I agree with the assessment and plan of care as documented in the APP's note, as addended by me or notated below:  Agree with discharge summary and plan.  Patient had drastic improvement of mood during hospitalization.  Although his primary diagnosis was major depressive disorder, there was concern over some features of bipolar disorder, and the patient responded well to combination of Zoloft and Prozac.  Cytomel was added to augment antidepressant effects of the medication regimen, which was tolerated well.  Patient denied having suicidal thoughts for more than 48 hours prior to discharge.  Denied having side effects to psychiatric medications.  Diagnoses at the time of discharge: Major depressive disorder versus bipolar depression GAD Panic disorder  Janine Limbo, MD Psychiatrist

## 2021-06-29 NOTE — [facility] Suicide Risk Assessment (Signed)
[facility] Discharge Suicide Risk Assessment   Principal Problem: Severe recurrent major depression without psychotic features [facility]) Discharge Diagnoses: Principal Problem:   Severe recurrent major depression without psychotic features ([place]) Active Problems:   Panic disorder (episodic paroxysmal anxiety)   GAD (generalized anxiety disorder)   Total Time spent with patient: 69 minutes  60 year old male with a past psychiatric history of major depressive disorder, generalized anxiety, panic disorder, was admitted to the psychiatric unit for evaluation and treatment of suicidal thoughts with plan, worsening depression, and worsening anxiety.  Patient reports multiyear history of worsening depression since about 2019 or 2020, that followed chemotherapy and Lupron therapy for prostate cancer.   During the patient's hospitalization, patient had extensive initial psychiatric evaluation, and follow-up psychiatric evaluations every day. The following diagnoses were given, at initial assessment: Major depressive disorder versus bipolar depression GAD Panic disorder  Patient's psychiatric medications were adjusted on admission:  Stop Pristiq Stop Abilify Start Prozac 10 mg once daily, and titrate up during the weekend, for depression Start Zyprexa 2.5 mg at bedtime, and titrate up during the weekend, for depression, insomnia, poor appetite, anxiety, and ruminative obsessive thoughts Change clonazepam from as needed to scheduled  During the hospitalization, other adjustments were made to the patient's psychiatric medication regimen:  Prozac and Zyprexa, were titrated to dose at time of discharge. Cytomel was also started, to augment antidepressant effects of Prozac.  Patient's care was discussed during the interdisciplinary team meeting every day during the hospitalization.  The patient denies having side effects to prescribed psychiatric medication. The patient reports their target psychiatric  symptoms of depression, anxiety, poor appetite, poor sleep, and suicidal thoughts, all responded well to the psychiatric medications, and the patient reports overall benefit other psychiatric hospitalization.   Labs were reviewed with the patient, and abnormal results were discussed with the patient. The patient denied having suicidal thoughts more than 48 hours prior to discharge.  Patient denies having homicidal thoughts.  Patient denies having auditory hallucinations.  Patient denies any visual hallucinations.  Patient denies having paranoid thoughts. The patient is able to verbalize their individual safety plan to this provider. It is recommended to the patient to continue psychiatric medications as prescribed, after discharge from the hospital.   It is recommended to the patient to follow up with your outpatient psychiatric provider and PCP. Discussed with the patient, the impact of alcohol, drugs, tobacco have been there overall psychiatric and medical wellbeing, and abstaining from substance use was recommended the patient.    Musculoskeletal: Strength & Muscle Tone: within normal limits Gait & Station: normal Patient leans: N/A  Psychiatric Specialty Exam  Presentation  General Appearance: Appropriate for Environment; Casual; Well Groomed  Eye Contact:Good  Speech:Clear and Coherent; Normal Rate  Speech Volume:Normal  Handedness:Right   Mood and Affect  Mood:Euthymic; Anxious  Duration of Depression Symptoms: No data recorded Affect:Appropriate; Congruent; Full Range   Thought Process  Thought Processes:Coherent; Linear  Descriptions of Associations:Intact  Orientation:Full (Time, Place and Person)  Thought Content:Logical  History of Schizophrenia/Schizoaffective disorder:No data recorded Duration of Psychotic Symptoms:No data recorded Hallucinations:Hallucinations: None  Ideas of Reference:None  Suicidal Thoughts:Suicidal Thoughts: No  Homicidal  Thoughts:Homicidal Thoughts: No   Sensorium  Memory:Immediate Good; Recent Good; Remote Good  Judgment:Good  Insight:Good   Executive Functions  Concentration:Good  Attention Span:Good  [place] of Knowledge:Good  Language:Good   Psychomotor Activity  Psychomotor Activity:Psychomotor Activity: Normal   Assets  Assets:Communication Skills; Desire for Improvement; Housing; Resilience; Social Support; Physical  Health; Vocational/Educational   Sleep  Sleep:Sleep: Good   Physical Exam: Physical Exam see discharge summary ROS see discharge summary Blood pressure 124/84, pulse 79, temperature 97.7 F (36.5 C), temperature source Oral, resp. rate 20, height 5\' 8"  (1.727 m), weight 83.9 kg, SpO2 95 %. Body mass index is 28.13 kg/m.  Mental Status Per Nursing Assessment::   On Admission:  Suicidal ideation indicated by patient, Self-harm thoughts  Demographic factors:  Caucasian, Unemployed, Low socioeconomic status Loss Factors:  Decrease in vocational status, Financial problems / change in socioeconomic status, Decline in physical health Historical Factors:  Impulsivity Risk Reduction Factors:  Sense of responsibility to family, Positive social support, Positive coping skills or problem solving skills, Living with another person, especially a relative, Positive therapeutic relationship, Responsible for children under 52 years of age  Continued Clinical Symptoms:  Depression:   Improved depressive symptoms.  Less sadness.  Less depression.  Less anhedonia.  Better sleep.  Better appetite.  Denies any suicidal thoughts.  Anxiety is elevated, but anticipatory in nature, related to discharge.  Cognitive Features That Contribute To Risk:  None    Suicide Risk:  Mild:  There are no identifiable suicide plans, no associated intent, mild dysphoria and related symptoms, good self-control (both objective and subjective assessment), few other risk factors, and  identifiable protective factors, including available and accessible social support.   Follow-up Information     [place] on 06/29/2021.   Why: You have a hospital follow up appointment on 06/29/21 at 2:30 pm.  This appointment will be held in person.  Following this initial appointment, you will be scheduled for a clinical assessment to obtain necessary therapy and medication management services. Contact information: [place] [address] [phone]                 Plan Of Care/Follow-up recommendations:   Activity: as tolerated  Diet: heart healthy  Other: -Follow-up with your outpatient psychiatric provider -instructions on appointment date, time, and address (location) are provided to you in discharge paperwork. -Take your psychiatric medications as prescribed at discharge - instructions are provided to you in the discharge paperwork -Follow-up with outpatient primary care doctor and other specialists -for management of chronic medical disease, including: Prostate cancer, COPD, hypertension -Testing: Follow-up with outpatient provider for abnormal lab results:  Abnormal lipid panel abnormal CBC Also follow-up with thyroid function lab work, as Cytomel was started during the hospitalization.  -Recommend cessation of alcohol, tobacco, and other illicit drug use at discharge.  -If your psychiatric symptoms recur, worsening, or if you have side effects to your psychiatric medications, call your outpatient psychiatric provider, 911, [phone] or go to the nearest emergency department. -If suicidal thoughts recur, call your outpatient psychiatric provider, 911, [phone] or go to the nearest emergency department.   Christoper Allegra, MD 06/29/2021, 10:07 AM

## 2021-06-29 NOTE — [facility] Group Notes (Signed)
Patient was given material for group today 

## 2021-06-29 NOTE — Progress Notes (Signed)
  [facility] Adult Case Management Discharge Plan :  Will you be returning to the same living situation after discharge:  Yes,  Home  At discharge, do you have transportation home?: Yes,  Girlfriend  Do you have the ability to pay for your medications: Yes,  Girlfriend  Release of information consent forms completed and in the chart;  Patient's signature needed at discharge.  Patient to Follow up at:  Follow-up Information     [facility] on 06/29/2021.   Why: You have a hospital follow up appointment on 06/29/21 at 2:30 pm.  This appointment will be held in person.  Following this initial appointment, you will be scheduled for a clinical assessment to obtain necessary therapy and medication management services. Contact information: [place] [id number] [phone]                 Next level of care provider has access to [place] and Suicide Prevention discussed: Yes,  with patient and girlfriend      Has patient been referred to the Quitline?: N/A patient is not a smoker  Patient has been referred for addiction treatment: [place], [place] 06/29/2021, 10:09 AM

## 2022-04-15 DIAGNOSIS — Z008 Encounter for other general examination: Secondary | ICD-10-CM | POA: Diagnosis not present

## 2022-04-15 DIAGNOSIS — R69 Illness, unspecified: Secondary | ICD-10-CM | POA: Diagnosis not present

## 2022-06-28 ENCOUNTER — Encounter: Payer: Self-pay | Admitting: Emergency Medicine

## 2022-06-28 ENCOUNTER — Emergency Department
Admission: EM | Admit: 2022-06-28 | Discharge: 2022-07-07 | Disposition: E | Payer: 59 | Attending: Emergency Medicine | Admitting: Emergency Medicine

## 2022-06-28 DIAGNOSIS — I10 Essential (primary) hypertension: Secondary | ICD-10-CM | POA: Insufficient documentation

## 2022-06-28 DIAGNOSIS — I499 Cardiac arrhythmia, unspecified: Secondary | ICD-10-CM | POA: Diagnosis not present

## 2022-06-28 DIAGNOSIS — I469 Cardiac arrest, cause unspecified: Secondary | ICD-10-CM | POA: Insufficient documentation

## 2022-06-28 DIAGNOSIS — R001 Bradycardia, unspecified: Secondary | ICD-10-CM | POA: Diagnosis not present

## 2022-06-28 DIAGNOSIS — R404 Transient alteration of awareness: Secondary | ICD-10-CM | POA: Diagnosis not present

## 2022-06-28 DIAGNOSIS — R0689 Other abnormalities of breathing: Secondary | ICD-10-CM | POA: Diagnosis not present

## 2022-06-28 DIAGNOSIS — R000 Tachycardia, unspecified: Secondary | ICD-10-CM | POA: Diagnosis not present

## 2022-06-28 LAB — CBC WITH DIFFERENTIAL/PLATELET
Abs Immature Granulocytes: 0.22 10*3/uL — ABNORMAL HIGH (ref 0.00–0.07)
Basophils Absolute: 0.1 10*3/uL (ref 0.0–0.1)
Basophils Relative: 1 %
Eosinophils Absolute: 0.1 10*3/uL (ref 0.0–0.5)
Eosinophils Relative: 1 %
HCT: 53.2 % — ABNORMAL HIGH (ref 39.0–52.0)
Hemoglobin: 16.2 g/dL (ref 13.0–17.0)
Immature Granulocytes: 2 %
Lymphocytes Relative: 51 %
Lymphs Abs: 5.3 10*3/uL — ABNORMAL HIGH (ref 0.7–4.0)
MCH: 31.5 pg (ref 26.0–34.0)
MCHC: 30.5 g/dL (ref 30.0–36.0)
MCV: 103.5 fL — ABNORMAL HIGH (ref 80.0–100.0)
Monocytes Absolute: 0.7 10*3/uL (ref 0.1–1.0)
Monocytes Relative: 7 %
Neutro Abs: 3.9 10*3/uL (ref 1.7–7.7)
Neutrophils Relative %: 38 %
Platelets: 195 10*3/uL (ref 150–400)
RBC: 5.14 MIL/uL (ref 4.22–5.81)
RDW: 13.1 % (ref 11.5–15.5)
Smear Review: NORMAL
WBC Morphology: ABNORMAL
WBC: 10.3 10*3/uL (ref 4.0–10.5)
nRBC: 0.4 % — ABNORMAL HIGH (ref 0.0–0.2)

## 2022-06-28 LAB — TROPONIN I (HIGH SENSITIVITY): Troponin I (High Sensitivity): 273 ng/L (ref <18)

## 2022-06-28 LAB — BASIC METABOLIC PANEL
Anion gap: 20 — ABNORMAL HIGH (ref 5–15)
BUN: 13 mg/dL (ref 8–23)
CO2: 17 mmol/L — ABNORMAL LOW (ref 22–32)
Calcium: 8.7 mg/dL — ABNORMAL LOW (ref 8.9–10.3)
Chloride: 104 mmol/L (ref 98–111)
Creatinine, Ser: 1.55 mg/dL — ABNORMAL HIGH (ref 0.61–1.24)
GFR, Estimated: 50 mL/min — ABNORMAL LOW (ref >60)
Glucose, Bld: 321 mg/dL — ABNORMAL HIGH (ref 70–99)
Potassium: 4.3 mmol/L (ref 3.5–5.1)
Sodium: 141 mmol/L (ref 135–145)

## 2022-06-28 MED ORDER — CALCIUM CHLORIDE 10 % IV SOLN
INTRAVENOUS | Status: AC | PRN
  Administered 2022-06-28: 1 g via INTRAVENOUS

## 2022-06-28 MED ORDER — EPINEPHRINE 1 MG/10ML IJ SOSY
PREFILLED_SYRINGE | INTRAMUSCULAR | Status: AC | PRN
  Administered 2022-06-28 (×3): 1 mg via INTRAVENOUS

## 2022-06-28 MED ORDER — EPINEPHRINE 1 MG/10ML IJ SOSY
PREFILLED_SYRINGE | INTRAMUSCULAR | Status: AC | PRN
  Administered 2022-06-28: 1 mg via INTRAVENOUS

## 2022-06-28 MED ORDER — SODIUM BICARBONATE 8.4 % IV SOLN
INTRAVENOUS | Status: AC | PRN
  Administered 2022-06-28: 50 meq via INTRAVENOUS

## 2022-06-28 MED ORDER — NOREPINEPHRINE 4 MG/250ML-% IV SOLN
INTRAVENOUS | Status: AC | PRN
  Administered 2022-06-28: 10 ug/min via INTRAVENOUS

## 2022-06-28 MED ORDER — NOREPINEPHRINE 4 MG/250ML-% IV SOLN
INTRAVENOUS | Status: AC
  Filled 2022-06-28: qty 250

## 2022-07-07 NOTE — ED Notes (Signed)
Pulse check; no pulse; CPR resumed

## 2022-07-07 NOTE — ED Notes (Signed)
Time of of death called by Milinda Hirschfeld, MD at this time

## 2022-07-07 NOTE — ED Notes (Signed)
ME- Samantha at bedside examining patient.

## 2022-07-07 NOTE — Progress Notes (Signed)
   07/26/2022 1400  Clinical Encounter Type  Visited With Patient and family together  Visit Type Initial  Referral From Nurse  Consult/Referral To Chaplain   Chaplain responded to CPR in progress. Chaplain supported large family. Patient passed and family is grieving. Chaplain stayed with family in consult room. Chaplain services are available for continued follow up as needed.

## 2022-07-07 NOTE — ED Provider Notes (Signed)
[facility] Provider Note    Event Date/Time   First MD Initiated Contact with Patient 07/11/22 1318     (approximate)   History   Cardiac Arrest   HPI  Gerald Boyer is a 61 y.o. male with past history of depression and hypertension who is brought to the ED due to cardiac arrest.  Per EMS, around 1230 today, patient was observed to slump over in his car in a grocery store parking lot.  Bystanders notified the store manager who came to the patient's aide and called EMS and initiated bystander CPR.  EMS report that on their arrival the patient's rhythm was PEA and they continued CPR.  They gave 1 amp of sodium bicarbonate and 3 rounds of epinephrine prior to arrival in the ED, without improvement.     Physical Exam   Triage Vital Signs: ED Triage Vitals  Enc Vitals Group     BP 11-Jul-2022 1259 (!) 162/128     Pulse Rate 07-11-22 1310 (!) 102     Resp July 11, 2022 1305 (!) 31     Temp --      Temp src --      SpO2 07-11-22 1259 (!) 70 %     Weight 11-Jul-2022 1321 184 lb 15.5 oz (83.9 kg)     Height 11-Jul-2022 1321 '5\' 8"'$  (1.727 m)     Head Circumference --      Peak Flow --      Pain Score 2022/07/11 1247 0     Pain Loc --      Pain Edu? --      Excl. in [place]? --     Most recent vital signs: Vitals:   07/11/2022 1305 Jul 11, 2022 1310  BP: (!) 113/32 (!) 138/15  Pulse:  (!) 102  Resp: (!) 31 (!) 63  SpO2: (!) 88% (!) 69%    General: Comatose, GCS 3. CV:  Lucas device in place providing continuous compressions Resp:  EMS LMA device in place, bagging easily.  Good symmetric breath sounds. Abd:  No distention.  Other:  No signs of trauma.  Good peripheral pulses with CPR.  Pupils fixed and dilated on arrival.   ED Results / Procedures / Treatments   Labs (all labs ordered are listed, but only abnormal results are displayed) Labs Reviewed  CBC WITH DIFFERENTIAL/PLATELET - Abnormal; Notable for the following components:      Result Value   HCT 53.2  (*)    MCV 103.5 (*)    nRBC 0.4 (*)    Lymphs Abs 5.3 (*)    Abs Immature Granulocytes 0.22 (*)    All other components within normal limits  BASIC METABOLIC PANEL - Abnormal; Notable for the following components:   CO2 17 (*)    Glucose, Bld 321 (*)    Creatinine, Ser 1.55 (*)    Calcium 8.7 (*)    GFR, Estimated 50 (*)    Anion gap 20 (*)    All other components within normal limits  TROPONIN I (HIGH SENSITIVITY) - Abnormal; Notable for the following components:   Troponin I (High Sensitivity) 273 (*)    All other components within normal limits  TROPONIN I (HIGH SENSITIVITY)     RADIOLOGY    PROCEDURES:  .Critical Care  Performed by: Carrie Mew, MD Authorized by: Carrie Mew, MD   Critical care provider statement:    Critical care time (minutes):  33   Critical care time was exclusive of:  Separately billable procedures and treating other patients   Critical care was necessary to treat or prevent imminent or life-threatening deterioration of the following conditions:  Cardiac failure, CNS failure or compromise and shock   Critical care was time spent personally by me on the following activities:  Development of treatment plan with patient or surrogate, discussions with consultants, evaluation of patient's response to treatment, examination of patient, obtaining history from patient or surrogate, ordering and performing treatments and interventions, ordering and review of laboratory studies, ordering and review of radiographic studies, pulse oximetry, re-evaluation of patient's condition and review of old charts Comments:        CPR  Date/Time: 07-01-2022 3:19 PM  Performed by: Carrie Mew, MD Authorized by: Carrie Mew, MD  CPR Procedure Details:      Amount of time prior to administration of ACLS/BLS (minutes):  10   ACLS/BLS initiated by EMS: Yes     CPR/ACLS performed in the ED: Yes     Duration of CPR (minutes):  30   Outcome: Pt  declared dead    CPR performed via ACLS guidelines under my direct supervision.  See RN documentation for details including defibrillator use, medications, doses and timing.    MEDICATIONS ORDERED IN ED: Medications  norepinephrine (LEVOPHED) 4-5 MG/250ML-% infusion SOLN (  Not Given July 01, 2022 1357)  sodium bicarbonate injection (50 mEq Intravenous Given July 01, 2022 1251)  calcium chloride injection (1 g Intravenous Given 2022-07-01 1251)  EPINEPHrine (ADRENALIN) 1 MG/10ML injection (1 mg Intravenous Given 01-Jul-2022 1256)  EPINEPHrine (ADRENALIN) 1 MG/10ML injection (1 mg Intravenous Given 01-Jul-2022 1312)  norepinephrine (LEVOPHED) '4mg'$  in 254m (0.016 mg/mL) premix infusion (0 mcg/kg/min Intravenous Stopped 110/26/20231318)     IMPRESSION / MDM / ASSESSMENT AND PLAN / ED COURSE  I reviewed the triage vital signs and the nursing notes.                              Patient arrives in cardiac arrest.  Reportedly that was witnessed, but with patient's pupils being fixed and dilated on arrival, I think his downtime may have been underestimated.  Differential diagnosis is broad and includes, but is not limited to, ACS, PE, dissection, tamponade, acidosis, hyperkalemia, renal failure  See code documentation.  Patient arrived with CPR in progress which was continued.  He was given additional sodium bicarbonate and calcium IV along with epinephrine per ACLS protocol.  Repeated pulse checks found an initial rhythm of PEA, and patient remained in PEA throughout the duration of resuscitative attempts.  Bedside ultrasound early on showed coordinated cardiac activity, so norepinephrine was also started.  However, with continued resuscitation attempts, patient remained in PEA.  Ultrasound did not demonstrate any carotid pulsation, and patient was found to develop cardiac standstill.  Resuscitation was terminated and patient declared dead at 1:18 PM.   Clinical Course as of [id number]  Mon OOctober 26, 2023Family informed of patient's death. Awaiting chaplain. Reviewed chart - no current PCP [PS]    Clinical Course User Index [PS] SCarrie Mew MD    ----------------------------------------- 3:25 PM on 110/26/2023----------------------------------------- Case discussed with medical examiner SFrancene Boyerswho accepts the patient for ME evaluation.   FINAL CLINICAL IMPRESSION(S) / ED DIAGNOSES   Final diagnoses:  Cardiac arrest ([place]     Rx / DC Orders   ED Discharge Orders     None        Note:  This document was prepared using Dragon voice recognition software and may include unintentional dictation errors.   Carrie Mew, MD 07-04-2022 1525

## 2022-07-07 NOTE — ED Triage Notes (Addendum)
Patient to ED via [facility] from [employer] parking lot. Witness arrest by bystanders- down approx 10 minutes prior to EMS.  EMS given 3 epi, 1 biacarb. CPR started at 1210

## 2022-07-07 NOTE — ED Notes (Signed)
Family at bedside viewing patient. Chaplin with family.

## 2022-07-07 NOTE — ED Notes (Signed)
Patient take to morgue by patient transport at this time.

## 2022-07-07 NOTE — ED Notes (Signed)
Patient belongings sent with patient.  1 wallet 1 set of keys 1 phone 2 shoes Lose change 1 pocket knife 1 belt 1 pants  1 shirt

## 2022-07-07 DEATH — deceased
